# Patient Record
Sex: Male | Born: 1950
Health system: Southern US, Community
[De-identification: ages and names within clinical notes are randomized; demographics above are authoritative.]

## PROBLEM LIST (undated history)

## (undated) DIAGNOSIS — F329 Major depressive disorder, single episode, unspecified: Secondary | ICD-10-CM

## (undated) DIAGNOSIS — I4891 Unspecified atrial fibrillation: Secondary | ICD-10-CM

## (undated) DIAGNOSIS — K219 Gastro-esophageal reflux disease without esophagitis: Secondary | ICD-10-CM

## (undated) DIAGNOSIS — I499 Cardiac arrhythmia, unspecified: Secondary | ICD-10-CM

## (undated) DIAGNOSIS — R001 Bradycardia, unspecified: Secondary | ICD-10-CM

## (undated) DIAGNOSIS — T7840XA Allergy, unspecified, initial encounter: Secondary | ICD-10-CM

## (undated) DIAGNOSIS — H18603 Keratoconus, unspecified, bilateral: Secondary | ICD-10-CM

## (undated) DIAGNOSIS — M1990 Unspecified osteoarthritis, unspecified site: Secondary | ICD-10-CM

## (undated) DIAGNOSIS — F32A Depression, unspecified: Secondary | ICD-10-CM

## (undated) DIAGNOSIS — H269 Unspecified cataract: Secondary | ICD-10-CM

## (undated) DIAGNOSIS — H33319 Horseshoe tear of retina without detachment, unspecified eye: Secondary | ICD-10-CM

## (undated) DIAGNOSIS — R519 Headache, unspecified: Secondary | ICD-10-CM

## (undated) DIAGNOSIS — E785 Hyperlipidemia, unspecified: Secondary | ICD-10-CM

## (undated) DIAGNOSIS — Z87442 Personal history of urinary calculi: Secondary | ICD-10-CM

## (undated) DIAGNOSIS — M8580 Other specified disorders of bone density and structure, unspecified site: Secondary | ICD-10-CM

## (undated) DIAGNOSIS — R42 Dizziness and giddiness: Secondary | ICD-10-CM

## (undated) DIAGNOSIS — N189 Chronic kidney disease, unspecified: Secondary | ICD-10-CM

## (undated) DIAGNOSIS — G4730 Sleep apnea, unspecified: Secondary | ICD-10-CM

## (undated) DIAGNOSIS — F419 Anxiety disorder, unspecified: Secondary | ICD-10-CM

## (undated) DIAGNOSIS — I10 Essential (primary) hypertension: Secondary | ICD-10-CM

## (undated) DIAGNOSIS — R51 Headache: Secondary | ICD-10-CM

## (undated) DIAGNOSIS — E119 Type 2 diabetes mellitus without complications: Secondary | ICD-10-CM

## (undated) HISTORY — DX: Allergy, unspecified, initial encounter: T78.40XA

## (undated) HISTORY — DX: Other specified disorders of bone density and structure, unspecified site: M85.80

## (undated) HISTORY — DX: Gastro-esophageal reflux disease without esophagitis: K21.9

## (undated) HISTORY — PX: RETINAL LASER PROCEDURE: SHX2339

## (undated) HISTORY — DX: Anxiety disorder, unspecified: F41.9

## (undated) HISTORY — DX: Essential (primary) hypertension: I10

## (undated) HISTORY — DX: Hyperlipidemia, unspecified: E78.5

## (undated) HISTORY — DX: Keratoconus, unspecified, bilateral: H18.603

## (undated) HISTORY — PX: WISDOM TOOTH EXTRACTION: SHX21

## (undated) HISTORY — DX: Sleep apnea, unspecified: G47.30

## (undated) HISTORY — DX: Unspecified cataract: H26.9

## (undated) HISTORY — DX: Chronic kidney disease, unspecified: N18.9

## (undated) HISTORY — DX: Bradycardia, unspecified: R00.1

## (undated) HISTORY — PX: EYE SURGERY: SHX253

## (undated) HISTORY — DX: Unspecified atrial fibrillation: I48.91

## (undated) HISTORY — DX: Unspecified osteoarthritis, unspecified site: M19.90

## (undated) HISTORY — PX: POLYPECTOMY: SHX149

## (undated) HISTORY — PX: OTHER SURGICAL HISTORY: SHX169

## (undated) HISTORY — DX: Horseshoe tear of retina without detachment, unspecified eye: H33.319

## (undated) HISTORY — DX: Dizziness and giddiness: R42

## (undated) HISTORY — DX: Type 2 diabetes mellitus without complications: E11.9

---

## 2000-06-16 ENCOUNTER — Emergency Department (HOSPITAL_COMMUNITY): Admission: EM | Admit: 2000-06-16 | Discharge: 2000-06-16 | Payer: Self-pay | Admitting: Emergency Medicine

## 2001-03-10 HISTORY — PX: CARDIAC CATHETERIZATION: SHX172

## 2001-10-01 ENCOUNTER — Ambulatory Visit (HOSPITAL_COMMUNITY): Admission: RE | Admit: 2001-10-01 | Discharge: 2001-10-01 | Payer: Self-pay | Admitting: *Deleted

## 2001-10-01 ENCOUNTER — Encounter: Payer: Self-pay | Admitting: *Deleted

## 2001-10-13 ENCOUNTER — Ambulatory Visit (HOSPITAL_COMMUNITY): Admission: RE | Admit: 2001-10-13 | Discharge: 2001-10-14 | Payer: Self-pay | Admitting: Cardiology

## 2001-10-13 ENCOUNTER — Encounter: Payer: Self-pay | Admitting: Cardiology

## 2004-04-13 ENCOUNTER — Ambulatory Visit: Payer: Self-pay | Admitting: Family Medicine

## 2005-12-09 ENCOUNTER — Ambulatory Visit: Payer: Self-pay | Admitting: Family Medicine

## 2005-12-16 ENCOUNTER — Ambulatory Visit: Payer: Self-pay | Admitting: Family Medicine

## 2005-12-31 ENCOUNTER — Ambulatory Visit: Payer: Self-pay | Admitting: Internal Medicine

## 2005-12-31 ENCOUNTER — Ambulatory Visit: Payer: Self-pay | Admitting: Gastroenterology

## 2006-01-14 ENCOUNTER — Encounter (INDEPENDENT_AMBULATORY_CARE_PROVIDER_SITE_OTHER): Payer: Self-pay | Admitting: *Deleted

## 2006-01-14 ENCOUNTER — Ambulatory Visit: Payer: Self-pay | Admitting: Gastroenterology

## 2006-04-27 ENCOUNTER — Ambulatory Visit: Payer: Self-pay | Admitting: Family Medicine

## 2006-06-24 ENCOUNTER — Ambulatory Visit: Payer: Self-pay | Admitting: Family Medicine

## 2006-10-05 DIAGNOSIS — E782 Mixed hyperlipidemia: Secondary | ICD-10-CM | POA: Insufficient documentation

## 2006-10-05 DIAGNOSIS — I10 Essential (primary) hypertension: Secondary | ICD-10-CM | POA: Insufficient documentation

## 2006-10-05 DIAGNOSIS — F411 Generalized anxiety disorder: Secondary | ICD-10-CM | POA: Insufficient documentation

## 2006-10-21 ENCOUNTER — Telehealth: Payer: Self-pay | Admitting: Family Medicine

## 2006-11-09 ENCOUNTER — Other Ambulatory Visit: Payer: Self-pay

## 2006-11-09 ENCOUNTER — Emergency Department: Payer: Self-pay | Admitting: Emergency Medicine

## 2006-11-10 ENCOUNTER — Ambulatory Visit: Payer: Self-pay | Admitting: Internal Medicine

## 2006-11-10 LAB — CONVERTED CEMR LAB: TSH: 3.41 microintl units/mL (ref 0.35–5.50)

## 2006-11-16 ENCOUNTER — Ambulatory Visit: Payer: Self-pay

## 2007-01-21 ENCOUNTER — Ambulatory Visit: Payer: Self-pay | Admitting: Internal Medicine

## 2007-06-30 ENCOUNTER — Telehealth: Payer: Self-pay | Admitting: Family Medicine

## 2007-07-05 ENCOUNTER — Telehealth: Payer: Self-pay | Admitting: Family Medicine

## 2007-12-15 ENCOUNTER — Ambulatory Visit: Payer: Self-pay | Admitting: Internal Medicine

## 2008-02-01 ENCOUNTER — Ambulatory Visit: Payer: Self-pay | Admitting: Family Medicine

## 2008-02-01 ENCOUNTER — Encounter: Payer: Self-pay | Admitting: Family Medicine

## 2008-04-11 ENCOUNTER — Ambulatory Visit: Payer: Self-pay | Admitting: Family Medicine

## 2008-04-11 LAB — CONVERTED CEMR LAB
Bilirubin Urine: NEGATIVE
Blood in Urine, dipstick: NEGATIVE
Glucose, Urine, Semiquant: NEGATIVE
Ketones, urine, test strip: NEGATIVE
Nitrite: NEGATIVE
Protein, U semiquant: NEGATIVE
Specific Gravity, Urine: 1.02
Urobilinogen, UA: 0.2
WBC Urine, dipstick: NEGATIVE
pH: 6

## 2008-04-17 LAB — CONVERTED CEMR LAB
ALT: 28 units/L (ref 0–53)
AST: 25 units/L (ref 0–37)
Albumin: 3.7 g/dL (ref 3.5–5.2)
Alkaline Phosphatase: 61 units/L (ref 39–117)
BUN: 17 mg/dL (ref 6–23)
Basophils Absolute: 0 10*3/uL (ref 0.0–0.1)
Basophils Relative: 0.7 % (ref 0.0–3.0)
Bilirubin, Direct: 0.1 mg/dL (ref 0.0–0.3)
CO2: 33 meq/L — ABNORMAL HIGH (ref 19–32)
Calcium: 9.3 mg/dL (ref 8.4–10.5)
Chloride: 106 meq/L (ref 96–112)
Cholesterol: 249 mg/dL (ref 0–200)
Creatinine, Ser: 1.1 mg/dL (ref 0.4–1.5)
Direct LDL: 160.9 mg/dL
Eosinophils Absolute: 0.3 10*3/uL (ref 0.0–0.7)
Eosinophils Relative: 4.5 % (ref 0.0–5.0)
GFR calc Af Amer: 89 mL/min
GFR calc non Af Amer: 74 mL/min
Glucose, Bld: 106 mg/dL — ABNORMAL HIGH (ref 70–99)
HCT: 43.5 % (ref 39.0–52.0)
HDL: 38.9 mg/dL — ABNORMAL LOW (ref >39.0)
Hemoglobin: 15.2 g/dL (ref 13.0–17.0)
Lymphocytes Relative: 23 % (ref 12.0–46.0)
MCHC: 35 g/dL (ref 30.0–36.0)
MCV: 88.5 fL (ref 78.0–100.0)
Monocytes Absolute: 0.5 10*3/uL (ref 0.1–1.0)
Monocytes Relative: 9.4 % (ref 3.0–12.0)
Neutro Abs: 3.7 10*3/uL (ref 1.4–7.7)
Neutrophils Relative %: 62.4 % (ref 43.0–77.0)
PSA: 1.08 ng/mL (ref 0.10–4.00)
Platelets: 181 10*3/uL (ref 150–400)
Potassium: 4.6 meq/L (ref 3.5–5.1)
RBC: 4.92 M/uL (ref 4.22–5.81)
RDW: 12.4 % (ref 11.5–14.6)
Sodium: 141 meq/L (ref 135–145)
TSH: 2.47 microintl units/mL (ref 0.35–5.50)
Total Bilirubin: 1 mg/dL (ref 0.3–1.2)
Total CHOL/HDL Ratio: 6.4
Total Protein: 6.5 g/dL (ref 6.0–8.3)
Triglycerides: 190 mg/dL — ABNORMAL HIGH (ref 0–149)
VLDL: 38 mg/dL (ref 0–40)
WBC: 5.8 10*3/uL (ref 4.5–10.5)

## 2008-04-19 ENCOUNTER — Ambulatory Visit: Payer: Self-pay | Admitting: Family Medicine

## 2008-04-19 DIAGNOSIS — K219 Gastro-esophageal reflux disease without esophagitis: Secondary | ICD-10-CM | POA: Insufficient documentation

## 2008-04-19 DIAGNOSIS — R42 Dizziness and giddiness: Secondary | ICD-10-CM | POA: Insufficient documentation

## 2008-05-19 ENCOUNTER — Telehealth: Payer: Self-pay | Admitting: Family Medicine

## 2008-06-02 DIAGNOSIS — M899 Disorder of bone, unspecified: Secondary | ICD-10-CM | POA: Insufficient documentation

## 2008-06-02 DIAGNOSIS — I4891 Unspecified atrial fibrillation: Secondary | ICD-10-CM | POA: Insufficient documentation

## 2008-06-02 DIAGNOSIS — M949 Disorder of cartilage, unspecified: Secondary | ICD-10-CM

## 2008-06-02 DIAGNOSIS — I495 Sick sinus syndrome: Secondary | ICD-10-CM | POA: Insufficient documentation

## 2008-10-04 ENCOUNTER — Telehealth: Payer: Self-pay | Admitting: Family Medicine

## 2008-10-09 ENCOUNTER — Ambulatory Visit: Payer: Self-pay | Admitting: Family Medicine

## 2008-10-10 LAB — CONVERTED CEMR LAB
ALT: 21 units/L (ref 0–53)
AST: 21 units/L (ref 0–37)
Albumin: 3.8 g/dL (ref 3.5–5.2)
Alkaline Phosphatase: 70 units/L (ref 39–117)
BUN: 18 mg/dL (ref 6–23)
Bilirubin, Direct: 0 mg/dL (ref 0.0–0.3)
CO2: 32 meq/L (ref 19–32)
Calcium: 8.6 mg/dL (ref 8.4–10.5)
Chloride: 107 meq/L (ref 96–112)
Cholesterol: 160 mg/dL (ref 0–200)
Creatinine, Ser: 1.1 mg/dL (ref 0.4–1.5)
GFR calc non Af Amer: 73.25 mL/min (ref >60)
Glucose, Bld: 113 mg/dL — ABNORMAL HIGH (ref 70–99)
HDL: 42.5 mg/dL (ref >39.00)
LDL Cholesterol: 84 mg/dL (ref 0–99)
Potassium: 4.5 meq/L (ref 3.5–5.1)
Sodium: 142 meq/L (ref 135–145)
Total Bilirubin: 1.1 mg/dL (ref 0.3–1.2)
Total CHOL/HDL Ratio: 4
Total Protein: 6.5 g/dL (ref 6.0–8.3)
Triglycerides: 168 mg/dL — ABNORMAL HIGH (ref 0.0–149.0)
VLDL: 33.6 mg/dL (ref 0.0–40.0)

## 2008-11-14 ENCOUNTER — Telehealth: Payer: Self-pay | Admitting: Internal Medicine

## 2008-11-20 ENCOUNTER — Ambulatory Visit: Payer: Self-pay | Admitting: Internal Medicine

## 2008-11-20 DIAGNOSIS — F528 Other sexual dysfunction not due to a substance or known physiological condition: Secondary | ICD-10-CM | POA: Insufficient documentation

## 2009-02-23 ENCOUNTER — Telehealth: Payer: Self-pay | Admitting: Family Medicine

## 2009-05-02 ENCOUNTER — Ambulatory Visit: Payer: Self-pay | Admitting: Internal Medicine

## 2009-08-03 ENCOUNTER — Telehealth: Payer: Self-pay | Admitting: Family Medicine

## 2009-09-24 ENCOUNTER — Telehealth: Payer: Self-pay | Admitting: Family Medicine

## 2009-10-09 ENCOUNTER — Ambulatory Visit: Payer: Self-pay | Admitting: Family Medicine

## 2009-10-10 ENCOUNTER — Ambulatory Visit: Payer: Self-pay | Admitting: Family Medicine

## 2009-10-10 LAB — CONVERTED CEMR LAB
Bilirubin Urine: NEGATIVE
Glucose, Urine, Semiquant: NEGATIVE
Ketones, urine, test strip: NEGATIVE
Nitrite: NEGATIVE
Protein, U semiquant: NEGATIVE
Specific Gravity, Urine: 1.02
Urobilinogen, UA: 0.2
WBC Urine, dipstick: NEGATIVE
pH: 5.5

## 2009-10-12 LAB — CONVERTED CEMR LAB
ALT: 31 units/L (ref 0–53)
AST: 22 units/L (ref 0–37)
Albumin: 3.6 g/dL (ref 3.5–5.2)
Alkaline Phosphatase: 61 units/L (ref 39–117)
BUN: 20 mg/dL (ref 6–23)
Basophils Absolute: 0 10*3/uL (ref 0.0–0.1)
Basophils Relative: 0.6 % (ref 0.0–3.0)
Bilirubin, Direct: 0.1 mg/dL (ref 0.0–0.3)
CO2: 30 meq/L (ref 19–32)
Calcium: 8.7 mg/dL (ref 8.4–10.5)
Chloride: 101 meq/L (ref 96–112)
Cholesterol: 176 mg/dL (ref 0–200)
Creatinine, Ser: 1.2 mg/dL (ref 0.4–1.5)
Direct LDL: 103.8 mg/dL
Eosinophils Absolute: 0.4 10*3/uL (ref 0.0–0.7)
Eosinophils Relative: 5.3 % — ABNORMAL HIGH (ref 0.0–5.0)
GFR calc non Af Amer: 64.77 mL/min (ref >60)
Glucose, Bld: 108 mg/dL — ABNORMAL HIGH (ref 70–99)
HCT: 41.4 % (ref 39.0–52.0)
HDL: 41.7 mg/dL (ref >39.00)
Hemoglobin: 14.1 g/dL (ref 13.0–17.0)
Lymphocytes Relative: 19.6 % (ref 12.0–46.0)
Lymphs Abs: 1.3 10*3/uL (ref 0.7–4.0)
MCHC: 34.1 g/dL (ref 30.0–36.0)
MCV: 90.4 fL (ref 78.0–100.0)
Monocytes Absolute: 0.5 10*3/uL (ref 0.1–1.0)
Monocytes Relative: 6.9 % (ref 3.0–12.0)
Neutro Abs: 4.6 10*3/uL (ref 1.4–7.7)
Neutrophils Relative %: 67.6 % (ref 43.0–77.0)
PSA: 1 ng/mL (ref 0.10–4.00)
Platelets: 157 10*3/uL (ref 150.0–400.0)
Potassium: 4.1 meq/L (ref 3.5–5.1)
RBC: 4.58 M/uL (ref 4.22–5.81)
RDW: 14.1 % (ref 11.5–14.6)
Sodium: 140 meq/L (ref 135–145)
TSH: 2.29 microintl units/mL (ref 0.35–5.50)
Total Bilirubin: 1.2 mg/dL (ref 0.3–1.2)
Total CHOL/HDL Ratio: 4
Total Protein: 6 g/dL (ref 6.0–8.3)
Triglycerides: 249 mg/dL — ABNORMAL HIGH (ref 0.0–149.0)
VLDL: 49.8 mg/dL — ABNORMAL HIGH (ref 0.0–40.0)
WBC: 6.8 10*3/uL (ref 4.5–10.5)

## 2010-04-09 NOTE — Assessment & Plan Note (Signed)
Summary: F4M  Medications Added EFFEXOR XR 150 MG XR24H-CAP (VENLAFAXINE HCL) 1 by mouth daily      Allergies Added:   Primary Provider:  Dr. Abran Cantor   History of Present Illness: [name] [name] returns today for followup of his atrial fibrillation.  He has had no increase in the frequency of his atrial fibrillation over the fall.  He went into atrial fibrillation for about an 12 hours.  He is interested in considering atrial fibrillation ablation but I do not think he has enough atrial fibrillation to warrant this.  He denies c/p or sob.  Current Medications (verified): 1)  Atenolol 25 Mg Tabs (Atenolol) .... One By Mouth Once Daily 2)  Effexor Xr 150 Mg Xr24h-Cap (Venlafaxine Hcl) .Marland Kitchen.. 1 By Mouth Daily 3)  Lisinopril 5 Mg Tabs (Lisinopril) .Marland Kitchen.. 1 By Mouth Once Daily 4)  Nexium 40 Mg Cpdr (Esomeprazole Magnesium) .... Take 1 Tab Each Morning 5)  Calcium Carbonate-Vitamin D 600-400 Mg-Unit  Tabs (Calcium Carbonate-Vitamin D) .Marland Kitchen.. 1 By Mouth Once Daily 6)  Aspirin 325 Mg Tabs (Aspirin) .... Take 1 Tab By Mouth Every Day 7)  Zocor 40 Mg Tabs (Simvastatin) .... Once Daily 8)  Flecainide Acetate 100 Mg Tabs (Flecainide Acetate) .... Take 2 Tablets As Needed For A Fib Over An Hour Period  Allergies (verified): 1)  ! Paxil (Paroxetine Hcl) 2)  ! * Phosphorus  Past History:  Past Medical History: Last updated: 06/02/2008 OSTEOPENIA (ICD-733.90) SINUS BRADYCARDIA (ICD-427.81) ATRIAL FIBRILLATION (ICD-427.31) WELL ADULT EXAM (ICD-V70.0) GERD (ICD-530.81) DIZZINESS OR VERTIGO (ICD-780.4) FAMILY HISTORY DIABETES 1ST DEGREE RELATIVE (ICD-V18.0) HYPERLIPIDEMIA (ICD-272.4) ANXIETY (ICD-300.00) HYPERTENSION (ICD-401.9)    Past Surgical History: Last updated: 04/19/2008 Denies surgical history cardiac catheterization 2003, normal colonoscopy 2007, benign polyps, repeat in 5 yrs  Review of Systems  The patient denies chest pain, syncope, dyspnea on exertion, and peripheral edema.     Vital Signs:  Patient profile:   60 year old male Height:      72 inches Weight:      243 pounds BMI:     33.08 Pulse rate:   78 / minute Resp:     16 per minute BP sitting:   123 / 78  (right arm)  Vitals Entered By: Marrion Coy, CNA (May 02, 2009 9:39 AM)  Physical Exam  General:  Well developed, well nourished, in no acute distress. Head:  normocephalic and atraumatic Eyes:  PERRLA/EOM intact; conjunctiva and lids normal. Mouth:  Teeth, gums and palate normal. Oral mucosa normal. Neck:  Neck supple, no JVD. No masses, thyromegaly or abnormal cervical nodes. Chest Wall:  No deformities, masses, tenderness or gynecomastia noted. Lungs:  Clear bilaterally to auscultation with no wheezes, rales, or rhonchi. Heart:  RRR with normal S1 and S2.  PMI is not enlarged or laterally displaced. Abdomen:  Bowel sounds positive; abdomen soft and non-tender without masses, organomegaly, or hernias noted. No hepatosplenomegaly. Msk:  Back normal, normal gait. Muscle strength and tone normal. Pulses:  pulses normal in all 4 extremities Extremities:  No clubbing or cyanosis. Neurologic:  Alert and oriented x 3.   Impression & Recommendations:  Problem # 1:  ATRIAL FIBRILLATION (ICD-427.31) His symptoms are well controlled. He has not had much in the way of symptoms.  He is not taking flecainide. His updated medication list for this problem includes:    Atenolol 25 Mg Tabs (Atenolol) ..... One by mouth once daily    Aspirin 325 Mg Tabs (Aspirin) .Marland Kitchen... Take 1 tab by  mouth every day    Flecainide Acetate 100 Mg Tabs (Flecainide acetate) .Marland Kitchen... Take 2 tablets as needed for a fib over an hour period  Problem # 2:  HYPERTENSION (ICD-401.9) His blood pressure is well controlled.  Continue current meds. His updated medication list for this problem includes:    Atenolol 25 Mg Tabs (Atenolol) ..... One by mouth once daily    Lisinopril 5 Mg Tabs (Lisinopril) .Marland Kitchen... 1 by mouth once daily     Aspirin 325 Mg Tabs (Aspirin) .Marland Kitchen... Take 1 tab by mouth every day  Patient Instructions: 1)  Your physician recommends that you schedule a follow-up appointment in: 12 months with Dr Ladona Ridgel

## 2010-04-09 NOTE — Assessment & Plan Note (Signed)
Summary: fu on meds/njr pt rsc/njr   Vital Signs:  Patient profile:   60 year old male Weight:      242 pounds BP sitting:   130 / 90  (left arm) Cuff size:   large  Vitals Entered By: Kathrynn Speed CMA (October 09, 2009 8:41 AM) CC: fu on meds, different acid reflux med due to insurance,had been getting Atenolol 50 mg from cardiologist,src   History of Present Illness: Here for med refills. He feels fine, and has no concerns. After being out of work for the past 2 1/2 years, he will start a new job in 2 weeks. He needs med refills, and it has been a year since he had labs drawn. He gets occasional brief runs of atrial fibrillation, but these are easily tolerated.   Current Medications (verified): 1)  Atenolol 25 Mg Tabs (Atenolol) .... One By Mouth Once Daily 2)  Effexor Xr 150 Mg Xr24h-Cap (Venlafaxine Hcl) .Marland Kitchen.. 1 By Mouth Daily 3)  Lisinopril 5 Mg Tabs (Lisinopril) .Marland Kitchen.. 1 By Mouth Once Daily 4)  Nexium 40 Mg Cpdr (Esomeprazole Magnesium) .... Take 1 Tab Each Morning 5)  Calcium Carbonate-Vitamin D 600-400 Mg-Unit  Tabs (Calcium Carbonate-Vitamin D) .Marland Kitchen.. 1 By Mouth Once Daily 6)  Aspirin 325 Mg Tabs (Aspirin) .... Take 1 Tab By Mouth Every Day 7)  Zocor 40 Mg Tabs (Simvastatin) .... Once Daily 8)  Flecainide Acetate 100 Mg Tabs (Flecainide Acetate) .... Take 2 Tablets As Needed For A Fib Over An Hour Period  Allergies (verified): 1)  ! Paxil (Paroxetine Hcl) 2)  ! * Phosphorus  Past History:  Past Medical History: OSTEOPENIA (ICD-733.90) SINUS BRADYCARDIA (ICD-427.81) ATRIAL FIBRILLATION (ICD-427.31), sees Dr. Sharrell Ku WELL ADULT EXAM (ICD-V70.0) GERD (ICD-530.81) DIZZINESS OR VERTIGO (ICD-780.4) FAMILY HISTORY DIABETES 1ST DEGREE RELATIVE (ICD-V18.0) HYPERLIPIDEMIA (ICD-272.4) ANXIETY (ICD-300.00) HYPERTENSION (ICD-401.9)    Review of Systems  The patient denies anorexia, fever, weight loss, weight gain, vision loss, decreased hearing, hoarseness, chest pain,  syncope, dyspnea on exertion, peripheral edema, prolonged cough, headaches, hemoptysis, abdominal pain, melena, hematochezia, severe indigestion/heartburn, hematuria, incontinence, genital sores, muscle weakness, suspicious skin lesions, transient blindness, difficulty walking, depression, unusual weight change, abnormal bleeding, enlarged lymph nodes, angioedema, breast masses, and testicular masses.    Physical Exam  General:  Well-developed,well-nourished,in no acute distress; alert,appropriate and cooperative throughout examination Neck:  No deformities, masses, or tenderness noted. Lungs:  Normal respiratory effort, chest expands symmetrically. Lungs are clear to auscultation, no crackles or wheezes. Heart:  Normal rate and regular rhythm. S1 and S2 normal without gallop, murmur, click, rub or other extra sounds. Psych:  Cognition and judgment appear intact. Alert and cooperative with normal attention span and concentration. No apparent delusions, illusions, hallucinations   Impression & Recommendations:  Problem # 1:  ATRIAL FIBRILLATION (ICD-427.31)  His updated medication list for this problem includes:    Atenolol 25 Mg Tabs (Atenolol) .Marland Kitchen..Marland Kitchen Two times a day    Aspirin 325 Mg Tabs (Aspirin) .Marland Kitchen... Take 1 tab by mouth every day    Flecainide Acetate 100 Mg Tabs (Flecainide acetate) .Marland Kitchen... Take 2 tablets as needed for a fib over an hour period  Problem # 2:  HYPERLIPIDEMIA (ICD-272.4)  His updated medication list for this problem includes:    Zocor 40 Mg Tabs (Simvastatin) ..... Once daily  Problem # 3:  ANXIETY (ICD-300.00)  His updated medication list for this problem includes:    Effexor Xr 150 Mg Xr24h-cap (Venlafaxine hcl) .Marland Kitchen... 1 by mouth  daily  Problem # 4:  HYPERTENSION (ICD-401.9)  His updated medication list for this problem includes:    Atenolol 25 Mg Tabs (Atenolol) .Marland Kitchen..Marland Kitchen Two times a day    Lisinopril 5 Mg Tabs (Lisinopril) .Marland Kitchen... 1 by mouth once daily  Complete  Medication List: 1)  Atenolol 25 Mg Tabs (Atenolol) .... Two times a day 2)  Effexor Xr 150 Mg Xr24h-cap (Venlafaxine hcl) .Marland Kitchen.. 1 by mouth daily 3)  Lisinopril 5 Mg Tabs (Lisinopril) .Marland Kitchen.. 1 by mouth once daily 4)  Calcium Carbonate-vitamin D 600-400 Mg-unit Tabs (Calcium carbonate-vitamin d) .Marland Kitchen.. 1 by mouth once daily 5)  Aspirin 325 Mg Tabs (Aspirin) .... Take 1 tab by mouth every day 6)  Zocor 40 Mg Tabs (Simvastatin) .... Once daily 7)  Flecainide Acetate 100 Mg Tabs (Flecainide acetate) .... Take 2 tablets as needed for a fib over an hour period 8)  Omeprazole 40 Mg Cpdr (Omeprazole) .... Once daily  Patient Instructions: 1)  Refilled meds. He will return soon for fasting labs. We will plan on giving him a cpx later this fall when his new insurance becomes effective.  Prescriptions: ZOCOR 40 MG TABS (SIMVASTATIN) once daily  #90 x 3   Entered and Authorized by:   Nelwyn Salisbury MD   Signed by:   Nelwyn Salisbury MD on 10/09/2009   Method used:   Print then Give to Patient   RxID:   [id number] OMEPRAZOLE 40 MG CPDR (OMEPRAZOLE) once daily  #90 x 3   Entered and Authorized by:   Nelwyn Salisbury MD   Signed by:   Nelwyn Salisbury MD on 10/09/2009   Method used:   Print then Give to Patient   RxID:   [id number] LISINOPRIL 5 MG TABS (LISINOPRIL) 1 by mouth once daily  #90 x 3   Entered and Authorized by:   Nelwyn Salisbury MD   Signed by:   Nelwyn Salisbury MD on 10/09/2009   Method used:   Print then Give to Patient   RxID:   [id number] EFFEXOR XR 150 MG XR24H-CAP (VENLAFAXINE HCL) 1 by mouth daily  #90 x 3   Entered and Authorized by:   Nelwyn Salisbury MD   Signed by:   Nelwyn Salisbury MD on 10/09/2009   Method used:   Print then Give to Patient   RxID:   [id number] ATENOLOL 25 MG TABS (ATENOLOL) two times a day  #180 x 3   Entered and Authorized by:   Nelwyn Salisbury MD   Signed by:   Nelwyn Salisbury MD on 10/09/2009   Method used:   Print then Give to Patient   RxID:    [id number]

## 2010-04-09 NOTE — Progress Notes (Signed)
Summary: [facility] new rx  Phone Note Call from Patient Call back at Home Phone [phone]   Caller: Patient Call For: Nelwyn Salisbury MD Summary of Call: pt needs new rx fax to [facility] [phone] lisinopril 5 mg #90 with 3 refills also simvastin 40 mg,atenolol 50 mg and generic effexor 150 mg and generic nexium 40 mg. pt needs 14 day supply call into [facility] [place] [phone] generic effexor 150 mg and lisinopril 5 mg, simvastin 40 and generic nexium until mail order arrives Initial call taken by: Heron Sabins,  September 24, 2009 10:29 AM  Follow-up for Phone Call        he needs an OV first before  any refills can be written Follow-up by: Nelwyn Salisbury MD,  September 24, 2009 5:25 PM  Additional Follow-up for Phone Call Additional follow up Details #1::        ov 09-25-2009 3pm Additional Follow-up by: Heron Sabins,  September 25, 2009 8:11 AM

## 2010-04-09 NOTE — Progress Notes (Signed)
Summary: poison oak/ivy  Phone Note Call from Patient Call back at Home Phone [phone]   Summary of Call: Can a Rx be called in for poison oak that he got into earlier this week working outside.  Rash, whelps, scratching, itching on arms & legs.  Chlorox treatment has not helped.  [place] [facility] [employer].  Allergic sulfa. Initial call taken by: Rudy Jew, RN,  Aug 03, 2009 8:59 AM  Follow-up for Phone Call        call in a 10 mg prednisone taper pack for 12 days Follow-up by: Nelwyn Salisbury MD,  Aug 03, 2009 9:29 AM  Additional Follow-up for Phone Call Additional follow up Details #1::        Left message to check with drugstore. Additional Follow-up by: Rudy Jew, RN,  Aug 03, 2009 10:43 AM    New/Updated Medications: PREDNISONE (PAK) 10 MG TABS (PREDNISONE) 12 day pack as directed Prescriptions: PREDNISONE (PAK) 10 MG TABS (PREDNISONE) 12 day pack as directed  #1 x 9   Entered by:   Rudy Jew, RN   Authorized by:   Nelwyn Salisbury MD   Signed by:   Rudy Jew, RN on 08/03/2009   Method used:   Electronically to        [facility] [address] (retail)       [address], [address]       [place], [place]  [address]       Ph: [phone]       Fax: [phone]   RxID:   [id number]

## 2010-07-23 NOTE — Assessment & Plan Note (Signed)
[facility]                         ELECTROPHYSIOLOGY OFFICE NOTE   NAME:[name] [name]                      MRN:          [id number]  DATE:01/21/2007                            DOB:          1951-01-07    [name] [name] returns today for followup.  He is a very pleasant middle-  aged male with a history of paroxysmal atrial fibrillation and  hypertension and palpations for many years.  He was recently found to  have atrial fibrillation.  He notes that his episodes of atrial  fibrillation are very minimal.  He feels palpitations every day but  these last for seconds at a time.  He has had no prolonged episodes of  AFib since back in summertime.  This was a single __________  episode.  He has had none since.   PHYSICAL EXAMINATION:  GENERAL:  He is a pleasant, very well-appearing,  middle-aged man in no acute distress.  VITAL SIGNS:  Blood pressure 115/76, the pulse was 57 and regular,  respirations were 18, and the weight was 235 pounds.  NECK:  Revealed no jugular venous distention.  LUNGS:  Clear bilaterally to auscultation.  No wheezes, rales, or  rhonchi are present.  CARDIOVASCULAR:  Revealed a regular rate and rhythm with normal S1 and  S2.  EXTREMITIES:  Demonstrated no edema.   MEDICATIONS:  1. Lisinopril 5 mg daily.  2. Nexium 40 mg daily.  3. Calcium daily.  4. Atenolol 50 mg daily.  5. Effexor 75 mg daily.  6. Aspirin 81 mg three times a day.   IMPRESSION:  1. Paroxysmal atrial fibrillation.  2. Borderline hypertension.  3. Palpitations secondary to number one.   DISCUSSION:  Overall, [name] [name] is stable.  He is maintaining a sinus  rhythm very nicely.  I will plan to see him back in 6 months.  He will  call us if his AFib recurs or worsens.     [name] Canning. Ladona Ridgel, MD  Electronically Signed    GWT/MedQ  DD: 01/21/2007  DT: 01/22/2007  Job #: [id number]   cc:   [name] Senior A. Clent Ridges, MD

## 2010-07-23 NOTE — Assessment & Plan Note (Signed)
[facility]                         ELECTROPHYSIOLOGY OFFICE NOTE   NAME:Schaible, BRION SOSSAMON                      MRN:          [id number]  DATE:12/15/2007                            DOB:          June 20, 1950    Mr. Ballinas returns today for followup.  He is a very pleasant middle-  aged male with paroxysmal AFib and sinus bradycardia and well-controlled  hypertension, who we have not seen in the last year, but has done quite  well since his last visit.  The patient denies chest pain.  He denies  shortness of breath.  He has been very well.  He notes that he was laid  off from his job back in March and has been off for the last 6 months.  He is about to return to work.  He has been very stable, however, with  basically about 30 seconds of AFib on 1 occasion since we last saw him.  He denies chest pain or shortness of breath.   MEDICINES:  1. Lisinopril 5 a day.  2. Nexium 40 a day.  3. Aspirin 81 mg 2-3 tablets a day.  4. Atenolol 50 a day.  5. Effexor 75 a day.   PHYSICAL EXAMINATION:  GENERAL:  He is a pleasant well-appearing man in  no acute distress.  VITAL SIGNS:  Blood pressures 129/83, the pulse is 60 and regular,  respirations were 18, and the weight was 239 pounds.  NECK:  No jugular venous distention.  LUNGS:  Clear bilaterally to auscultation.  No wheezes, rales, or  rhonchi are present.  CARDIOVASCULAR:  Regular rate and rhythm.  Normal S1 and S2.  EXTREMITIES:  No edema.   EKG demonstrates sinus rhythm with sinus bradycardia and sinus  arrhythmia.   IMPRESSION:  1. Paroxysmal atrial fibrillation, now well controlled.  2. Sinus bradycardia, asymptomatic.  3. Hypertension.   DISCUSSION:  Overall, Mr. Broughton is stable.  He is maintaining sinus  rhythm very nicely.  I will see him back in the office for followup in 1  year or sooner if he has worsening AFib.     Doylene Canning. Ladona Ridgel, MD  Electronically Signed   GWT/MedQ  DD:  12/15/2007  DT: 12/16/2007  Job #: [id number]   cc:   Jeannett Senior A. Clent Ridges, MD

## 2010-07-23 NOTE — Assessment & Plan Note (Signed)
[facility]                         ELECTROPHYSIOLOGY OFFICE NOTE   HERNANDO, REALI                      MRN:          [id number]  DATE:11/10/2006                            DOB:          06-08-1950    REFERRING PHYSICIAN:  Tera Mater. Clent Ridges, MD   REFERRING PHYSICIAN:  Gerrit Friends. Dietrich Pates, MD.   REASON FOR VISIT:  Evaluation of atrial fibrillation.   The patient is a very pleasant, middle-aged man with a history of  palpitations and a documented anxiety disorder who has undergone monitor  wearing for many years. He is fairly active. He notes that he has been  under increased stress at work lately. He had no specific complaint but  was in his usual state of health several days ago when he had  palpitations which persisted and resulted in going to the [facility]  emergency room where he was subsequently found by report to be in atrial  fibrillation. I do not presently have strips of this but the report and  symptoms are fairly clear cut that he was in fact in A fib. The patient  was discharged home. He took some additional beta blockers and when he  awoke the following morning which was yesterday, he denied palpitations.  He is now referred for additional evaluation. The patient has never had  a prolonged episode of A fib other than the episode that he described  here recently.   MEDICATIONS:  1. Aspirin 81 mg daily.  2. Lisinopril 5 mg daily.  3. Atenolol 25 mg daily.  4. Nexium 40 a day.   PAST MEDICAL HISTORY:  Noncontributory.   SOCIAL HISTORY:  The patient is married, he denies tobacco or ethanol  abuse.   REVIEW OF SYSTEMS:  Negative except as noted in the HPI.   PHYSICAL EXAMINATION:  GENERAL:  Notable for him being a pleasant, well-  appearing man in no acute distress.  VITAL SIGNS:  The blood pressure was 110/74, the pulse 69 and regular.  Respirations were 18, the weight was 228 pounds.  HEENT:  Normocephalic, atraumatic. Pupils  equal and round, the  oropharynx was moist. Sclera anicteric.  NECK:  Revealed no jugular venous distention. There was no thyromegaly.  The trachea was midline. The carotids were 2+ and symmetric.  LUNGS:  Clear bilaterally to auscultation. No wheezes, rales or rhonchi  were present. There was no increased work of breathing.  CARDIOVASCULAR:  Regular rate and rhythm with normal S1 and S2. No  murmurs, rubs or gallops.  EXTREMITIES:  Demonstrated no cyanosis, clubbing or edema.  SKIN:  Normal.  NEUROLOGIC:  Alert and oriented x2. Cranial nerves intact. Strength was  5/5 and symmetric.  MUSCULOSKELETAL:  Normal.   EKG demonstrates sinus rhythm with normal axis and intervals. There were  no acute ST-T wave changes.   IMPRESSION:  1. New onset atrial fibrillation.  2. Palpitations.  3. Very borderline hypertension.   DISCUSSION:  I have asked today that Mr. Mckim undergo 2-D  echocardiography, that he obtain a TSH and that we increase him to full  strength aspirin. I will see  him back in the office in several months.  He is asked to take an additional beta blocker if he develops recurrent  atrial fibrillation. I have asked that he go up to 50 mg a day.     Doylene Canning. Ladona Ridgel, MD  Electronically Signed    GWT/MedQ  DD: 11/10/2006  DT: 11/11/2006  Job #: [id number]

## 2010-07-26 NOTE — Assessment & Plan Note (Signed)
[facility] OFFICE NOTE   [name] [name]                        MRN:          [id number]  DATE:12/16/2005                            DOB:          03-20-1950    This is a 60 year old gentleman here for a complete physical examination.  I  have not seen him now in almost 2 years.  He had a number of visits with me  and an extensive cardiac workup a couple of years ago for palpitations.  These were ultimately chalked up to anxiety and stress.  Once we got these  under control they have basically gone away, although he does feel an  occasional palpitation from time to time.  He has been on Effexor now for  several years and feels that it is very effective, his stress levels are  well managed, and he wonders if he could start coming off of the medication.  Otherwise, he has been doing a lot of traveling on the job over the past  year and not eating well.  He has gained a lot of weight and has not been  able to exercise very much.  He has never had a colonoscopy and he is  interested in having one of these.  He is also interested in having a bone  density evaluation.  He has been seeing a Land in [place] for  some time for occasional manipulations to relieve neck pain.  After a recent  set of x-rays his chiropractor told him his bones looked a little thin and  he suggested having a bone density test.  Of note, [name] [name]'s family history  includes severe osteoporosis in his mother.   For other details of his past medical history, family history, social  history, habits, etc., refer to our last physical note dated August 31, 2000.   ALLERGIES:  PHOSPHORUS and PAXIL.   CURRENT MEDICATIONS:  1. Multivitamins daily.  2. Aspirin 81 mg daily.  3. Lisinopril 5 mg daily.  4. Nexium 40 mg daily.  5. Atenolol 25 mg daily.  6. Effexor XR 150 mg daily.  7. Tylenol as needed for joint pains.   OBJECTIVE:  VITAL  SIGNS:  Weight 234, BP 138/94, pulse 72 and regular.  GENERAL:  He is a bit overweight.  SKIN:  Clear.  HEENT:  Eyes clear, wears glasses.  Ears clear, pharynx clear.  NECK:  Supple without lymphadenopathy or masses.  LUNGS:  Clear.  CARDIAC:  Rate and rhythm are regular without gallops, murmurs, rubs.  Distal pulses are full.  EKG is within normal limits.  ABDOMEN:  Soft, normal bowel sounds, nontender, no masses.  GENITALIA:  Normal male.  RECTAL:  No mass or tenderness.  Prostate is within normal limits.  Stool  hemoccult negative.  EXTREMITIES:  No clubbing, cyanosis, or edema.  NEUROLOGIC:  Grossly intact.   He was here for fasting labs on October 2 and these were remarkable for an  abnormal lipid panel.  Total cholesterol is 253, triglycerides are 212, HDL  is 39, and LDL has jumped  up to 157.  Otherwise, his labs were within normal  limits.   ASSESSMENT AND PLAN:  1. Complete physical examination.  He has gained some weight over the past      year so we talked about eating a smarter diet and getting more exercise      to lose weight.  Will also set up his first screening colonoscopy.  2. Possible osteoporosis.  Will set up a DEXA evaluation.  3. Hypertension, stable.  4. Hyperlipidemia.  Will make dietary changes as noted above and recheck a      lipid panel again in 6 months.  5. Anxiety, stable.  I told him we need to come down off of Effexor XR      very slowly.  Will decrease his Effexor XR daily dose to 75 mg over the      next 4 weeks and I asked to see him again in about 4 weeks to see how      he is feeling.  6. History of palpitations, stable.            ______________________________  Tera Mater. Clent Ridges, MD     SAF/MedQ  DD:  12/16/2005  DT:  12/18/2005  Job #:  [id number]

## 2010-07-26 NOTE — Cardiovascular Report (Signed)
   NAMEMERRICK, [name] [name]                           ACCOUNT NO.:  [id number]   MEDICAL RECORD NO.:  [id number]                   PATIENT TYPE:  OIB   LOCATION:  6533                                 FACILITY:  [facility]   PHYSICIAN:  Salvadore Farber, MD [facility]            DATE OF BIRTH:  Jul 14, 1950   DATE OF PROCEDURE:  10/13/2001  DATE OF DISCHARGE:  10/14/2001                              CARDIAC CATHETERIZATION   PROCEDURES:  Coronary angiography, left ventriculogram, left heart  catheterization.   INDICATIONS:  The patient is a 60 year old gentleman with hypertension and  family history of premature atherosclerotic disease, who has been troubled  by a profound fatigue. Exercise test performed two weeks ago demonstrated a  possible inferior defect. He was therefore referred for diagnostic  angiography.   DIAGNOSTIC TECHNIQUE:  Informed consent was obtained. Under 2% lidocaine  local anesthesia, a 6 French sheath was placed in the right femoral artery  using the modified Seldinger technique. Coronary arteries were engaged using  JL4 and JR4 catheters. Angiography was performed by hand injection.  A  pigtail catheter was advanced across the aortic valve into the left  ventricle.  Left ventriculography was performed by power injection. Left  ventricular pressures were measured. Following the procedure, the sheaths  were removed.   FINDINGS:  Right dominant system with angiographically normal coronary  arteries.  1. Left main:  Normal.  2. LAD:  The LAD is a large vessel which gives rise to two diagonal     branches. It is normal.  3. Circumflex:  The circumflex is a moderate sized vessel which gives rise     to one branching obtuse marginal branch. It is normal.  4. RCA:  The right coronary artery is dominant. It is normal.  5. LV:  Ejection fraction equals 65% without regional wall motion     abnormality. LV pressure 117/3/11.  6. There is no aortic stenosis or mitral regurgitation.   IMPRESSION:  Angiographically normal coronary arteries and left ventricular  systolic function.   PLAN:  We will plan on discharge this afternoon.  We will stop his beta  blocker in the case it is contributing to his depression. Will replace it  with lisinopril 5 mg p.o. q.d.                                                     Salvadore Farber, MD [facility]    WED/MEDQ  D:  10/13/2001  T:  10/18/2001  Job:  [id number]

## 2010-07-26 NOTE — Discharge Summary (Signed)
NAMEKEI, MCELHINEY                           ACCOUNT NO.:  [id number]   MEDICAL RECORD NO.:  [id number]                   PATIENT TYPE:  OIB   LOCATION:  6533                                 FACILITY:  [facility]   PHYSICIAN:  Joellyn Rued                        DATE OF BIRTH:  08-08-1950   DATE OF ADMISSION:  10/13/2001  DATE OF DISCHARGE:  10/14/2001                           DISCHARGE SUMMARY - REFERRING   HISTORY OF PRESENT ILLNESS:  Mr. Wolfson is a 60 year old white male who was  referred to our office from his primary care physician due to chest  discomfort.  He was seen in our office on August 5 by Dr. Samule Ohm.  According  to the note, he has had chest discomfort principally after he eats.  He has  taken Nexium every day for six weeks without relief of symptoms.  The  discomfort prompted an exercise Cardiolite which was performed on October 05, 2001.  He did exercise for 12 minutes and 15 seconds, achieving a maximum  heartbeat of 154, which is 91% predicted maximum.  Blood pressure increased  to 162/85.  He did not have any EKG changes, although he had modest  hypoprofusion of inferior apex extending up to the inferior wall toward the  base.  He does have a history of hypertension, hyperlipidemia, remote  tobacco use, and a family history.  He also describes a history of  palpitations which supposedly had been evaluated elsewhere; however, we do  not have any records of this.  An echocardiogram performed in October 2002  showed normal LV size and systolic function with an atrial septal aneurysm  without valvular disease.  It was also noted in the office that a hemoglobin  A1c was checked; however, the results are unavailable at the time of this  dictation.   LABORATORY DATA:  Preadmission H&H was 16.4 and 48.5, normal MCs, platelets  214, WBC 7.5.  Sodium 138, potassium 4.6, BUN 14, creatinine 1.1.   EKGs show normal sinus rhythm, nonspecific ST/T-wave changes, early  repolarization.   HOSPITAL COURSE:  Mr. Tienda underwent outpatient cardiac catheterization  on August 6 by Dr. Samule Ohm.  According to his progress note his ejection  fraction is 65% without regional wall motion abnormalities.  His LV  pressures were 117/3.  He did not have any MES or MR.  He had normal  coronary arteries.  Dr. Samule Ohm noted that he will stop his beta blocker in  case this is contributing to the patient's depression and he changed him to  an ACE inhibitor for his blood pressure.  _________ on bedrest.  The patient  did develop an abrupt hematoma at the catheterization site without  associated renal response.  Pressure was held an additional 21 minutes with  good hemostasis.  He remained overnight for observation.  On the morning of  August 7,  Dr. Samule Ohm felt catheterization site was intact.  Thus he was  discharged home.   DISCHARGE DIAGNOSES:  1. Noncardiac chest discomfort, false positive stress Cardiolite.  2. Depression.  3. History of hypertension.  4. History of hyperlipidemia.   DISPOSITION:  He is discharged home.   FOLLOW UP:  He will return to our office on August 15 at 9:15 a.m. with a  groin check with BPA.  At that time the patient was asking about what his  hemoglobin A1c is.  This is not available at the time of this dictation.  He  was instructed that he will be informed when he returns to the office.  He  will also have a BMP early next week prior to his appointment.    MEDICATIONS:  1. He was discharged home on aspirin 81 mg q.d.  2. Nexium 40 mg q.d.  3. Lisinopril 5 mg q.d.  4. Multivitamin q.d.   ACTIVITY:  He was advised no lifting, driving, sexual activity, or heavy  exertion x2 days.   DIET:  Maintain low salt, fat, cholesterol diet.   WOUND CARE:  If he has any problems with his catheterization site, he is  asked to call us immediately.   SPECIAL INSTRUCTIONS:  He was asked to arrange follow-up appointment with  Dr. Clent Ridges for  continuing management of his hypertension and hyperlipidemia,  depression issues.  He was instructed not to take his Toprol.                                                Joellyn Rued    EW/MEDQ  D:  10/14/2001  T:  10/18/2001  Job:  [id number]   cc:   Tera Mater. Clent Ridges, M.D. [facility]   Salvadore Farber, MD [facility]  [address] [place], [place] [address]  Fax: 1

## 2010-08-23 ENCOUNTER — Other Ambulatory Visit: Payer: Self-pay | Admitting: Family Medicine

## 2010-09-30 ENCOUNTER — Telehealth: Payer: Self-pay | Admitting: Internal Medicine

## 2010-09-30 NOTE — Telephone Encounter (Signed)
Pt was due to come in February, on Friday, was tired, pt went out of rythmn, took med went away after 1 hour and about 10 min and has not come back, next available 11-13-10 , pls advise, no appt made yet

## 2010-09-30 NOTE — Telephone Encounter (Signed)
LMOM for call back. 

## 2010-10-01 NOTE — Telephone Encounter (Signed)
Had an episode on 09/27/10 that lasted over an hour  Started around 9am and lasted until 10:10am  Took Flecainide and it went back into rhythm almost immediately  He feels it would have gone back in with out but just wanted [place] to know  He is going to go ahead and make a follow up appointment with Dr Ladona Ridgel

## 2010-10-01 NOTE — Telephone Encounter (Signed)
Pt calling re message from yesterday, was to call back today with update on condition -pls call [phone]

## 2010-10-08 ENCOUNTER — Encounter: Payer: Self-pay | Admitting: Internal Medicine

## 2010-10-14 ENCOUNTER — Other Ambulatory Visit: Payer: Self-pay | Admitting: *Deleted

## 2010-10-14 NOTE — Telephone Encounter (Signed)
Pt requesting rx refill of :  Venlafaxine 150 mg Simvastatin 40mg  Atenolol 25mg  Lisinopril 5 mg   Sent to [facility] on [address] in [place].  Pt has appt for cpx scheduled for 11/05/10, only needs a 30 day supply sent to [facility], normally gets refills sent to mail order pharmacy.

## 2010-10-15 ENCOUNTER — Ambulatory Visit (INDEPENDENT_AMBULATORY_CARE_PROVIDER_SITE_OTHER): Payer: BC Managed Care – PPO | Admitting: Internal Medicine

## 2010-10-15 ENCOUNTER — Encounter: Payer: Self-pay | Admitting: Internal Medicine

## 2010-10-15 DIAGNOSIS — I495 Sick sinus syndrome: Secondary | ICD-10-CM

## 2010-10-15 DIAGNOSIS — I10 Essential (primary) hypertension: Secondary | ICD-10-CM

## 2010-10-15 DIAGNOSIS — I4891 Unspecified atrial fibrillation: Secondary | ICD-10-CM

## 2010-10-15 MED ORDER — LISINOPRIL 5 MG PO TABS: 5.0000 mg | ORAL_TABLET | Freq: Every day | ORAL | Status: DC

## 2010-10-15 MED ORDER — SIMVASTATIN 40 MG PO TABS: 40.0000 mg | ORAL_TABLET | Freq: Every day | ORAL | Status: DC

## 2010-10-15 MED ORDER — ATENOLOL 25 MG PO TABS: 25.0000 mg | ORAL_TABLET | Freq: Two times a day (BID) | ORAL | Status: DC

## 2010-10-15 MED ORDER — VENLAFAXINE HCL ER 150 MG PO CP24: 150.0000 mg | ORAL_CAPSULE | Freq: Every day | ORAL | Status: DC

## 2010-10-15 MED ORDER — FLECAINIDE ACETATE 100 MG PO TABS: 200.0000 mg | ORAL_TABLET | ORAL | Status: DC | PRN

## 2010-10-15 NOTE — Telephone Encounter (Signed)
Short term 30 day refill efile to [facility] - keep appt 11/04/10

## 2010-10-15 NOTE — Assessment & Plan Note (Signed)
This remains asymptomatic. No indication for pacemaker at this time.

## 2010-10-15 NOTE — Assessment & Plan Note (Signed)
His blood pressure appears to be well-controlled. He will continue his current medications and maintain a low-sodium diet.

## 2010-10-15 NOTE — Assessment & Plan Note (Signed)
His symptoms are still well controlled. I recommended that he continue to use his antiarrhythmic drug as needed. He'll continue his low-dose beta blocker daily. If his symptoms of atrial fibrillation increase in frequency and severity, then I would recommend flecainide on a daily basis.

## 2010-10-15 NOTE — Progress Notes (Signed)
HPI [name] [name] returns today for followup. He is a pleasant 60 yo man with a h/o PAF, who has done very well, maintaining NSR for several yrs. He notes that about 3 weeks ago, he had an episode of palpitations and ultimately took 2 tabs of flecainide with prompt resolution of his symptoms. He denies c/p, sob, or peripheral edema. No syncope.  Allergies  Allergen Reactions  . Paroxetine   . Sulfa Antibiotics      Current Outpatient Prescriptions  Medication Sig Dispense Refill  . aspirin 325 MG tablet Take 325 mg by mouth daily.        Marland Kitchen atenolol (TENORMIN) 25 MG tablet Take 1 tablet (25 mg total) by mouth 2 (two) times daily.  60 tablet  0  . Calcium Carbonate-Vitamin D 600-400 MG-UNIT per tablet Take 1 tablet by mouth daily.        . flecainide (TAMBOCOR) 100 MG tablet Take 200 mg by mouth as needed. For a fib over an hour period       . lisinopril (PRINIVIL,ZESTRIL) 5 MG tablet Take 1 tablet (5 mg total) by mouth daily.  30 tablet  0  . omeprazole (PRILOSEC) 40 MG capsule Take 40 mg by mouth daily.        . simvastatin (ZOCOR) 40 MG tablet Take 1 tablet (40 mg total) by mouth daily.  30 tablet  0  . venlafaxine (EFFEXOR XR) 150 MG 24 hr capsule Take 1 capsule (150 mg total) by mouth daily.  30 capsule  0     Past Medical History  Diagnosis Date  . Osteopenia   . Sinus bradycardia   . Atrial fibrillation     sees Dr. Ladona Ridgel  . GERD (gastroesophageal reflux disease)   . Dizziness     or vertigo  . HLD (hyperlipidemia)   . Anxiety   . HTN (hypertension)     ROS:   All systems reviewed and negative except as noted in the HPI.   Past Surgical History  Procedure Date  . Cardiac catheterization 2003    normal  . Colonoscopy 2007    benign polyps, repeat in 5 years     Family History  Problem Relation Age of Onset  . Hodgkin's lymphoma Other   . Hypertension Other   . Hyperlipidemia Other   . Heart attack Other   . Prostate cancer Other     1st degree relative <50    . Diabetes Other     1st degree relative     History   Social History  . Marital Status: Married    Spouse Name: N/A    Number of Children: N/A  . Years of Education: N/A   Occupational History  . Not on file.   Social History Main Topics  . Smoking status: Former Games developer  . Smokeless tobacco: Not on file  . Alcohol Use: Yes  . Drug Use: No  . Sexually Active: Not on file   Other Topics Concern  . Not on file   Social History Narrative   Occupation: Airline pilot.      BP 128/76  Pulse 64  Resp 16  Ht 6\' 1"  (1.854 m)  Wt 249 lb (112.946 kg)  BMI 32.85 kg/m2  Physical Exam:  Well appearing middle-aged man, NAD HEENT: Unremarkable Neck:  No JVD, no thyromegally Lymphatics:  No adenopathy Back:  No CVA tenderness Lungs:  Clear no wheezes, rales, or rhonchi. HEART:  Regular rate rhythm, no murmurs, no rubs, no  clicks Abd:  soft, positive bowel sounds, no organomegally, no rebound, no guarding Ext:  2 plus pulses, no edema, no cyanosis, no clubbing Skin:  No rashes no nodules Neuro:  CN II through XII intact, motor grossly intact  EKG NSR with normal axis and intervals.  Assess/Plan:

## 2010-10-15 NOTE — Patient Instructions (Signed)
Your physician wants you to follow-up in: 6 months with Dr Taylor You will receive a reminder letter in the mail two months in advance. If you don't receive a letter, please call our office to schedule the follow-up appointment.  

## 2010-10-29 ENCOUNTER — Other Ambulatory Visit (INDEPENDENT_AMBULATORY_CARE_PROVIDER_SITE_OTHER): Payer: BC Managed Care – PPO

## 2010-10-29 DIAGNOSIS — Z0000 Encounter for general adult medical examination without abnormal findings: Secondary | ICD-10-CM

## 2010-10-29 LAB — CBC WITH DIFFERENTIAL/PLATELET
Eosinophils Absolute: 0.4 10*3/uL (ref 0.0–0.7)
Lymphocytes Relative: 21.2 % (ref 12.0–46.0)
MCHC: 33.3 g/dL (ref 30.0–36.0)
MCV: 90.5 fl (ref 78.0–100.0)
Monocytes Absolute: 0.5 10*3/uL (ref 0.1–1.0)
Neutrophils Relative %: 66.1 % (ref 43.0–77.0)
Platelets: 189 10*3/uL (ref 150.0–400.0)
RBC: 4.96 Mil/uL (ref 4.22–5.81)
WBC: 7.2 10*3/uL (ref 4.5–10.5)

## 2010-10-29 LAB — POCT URINALYSIS DIPSTICK
Blood, UA: NEGATIVE
Glucose, UA: NEGATIVE
Leukocytes, UA: NEGATIVE
Nitrite, UA: NEGATIVE
Urobilinogen, UA: 0.2

## 2010-10-29 LAB — BASIC METABOLIC PANEL
BUN: 16 mg/dL (ref 6–23)
CO2: 29 mEq/L (ref 19–32)
Calcium: 8.9 mg/dL (ref 8.4–10.5)
Chloride: 102 mEq/L (ref 96–112)
Creatinine, Ser: 1.1 mg/dL (ref 0.4–1.5)

## 2010-10-29 LAB — LIPID PANEL
Cholesterol: 168 mg/dL (ref 0–200)
Total CHOL/HDL Ratio: 4
VLDL: 47.2 mg/dL — ABNORMAL HIGH (ref 0.0–40.0)

## 2010-10-29 LAB — HEPATIC FUNCTION PANEL
Alkaline Phosphatase: 66 U/L (ref 39–117)
Bilirubin, Direct: 0.2 mg/dL (ref 0.0–0.3)
Total Bilirubin: 1 mg/dL (ref 0.3–1.2)

## 2010-10-29 LAB — TSH: TSH: 2.93 u[IU]/mL (ref 0.35–5.50)

## 2010-10-31 ENCOUNTER — Telehealth: Payer: Self-pay | Admitting: Family Medicine

## 2010-10-31 NOTE — Telephone Encounter (Signed)
Left voice message.

## 2010-10-31 NOTE — Telephone Encounter (Signed)
Message copied by Baldemar Friday on Thu Oct 31, 2010  4:09 PM ------      Message from: Gershon Crane A      Created: Thu Oct 31, 2010  9:08 AM       Normal except his glucose is higher than before, almost to the diabetes range. Watch a strict low calorie diet, and we will discuss at the cpx

## 2010-11-05 ENCOUNTER — Ambulatory Visit (INDEPENDENT_AMBULATORY_CARE_PROVIDER_SITE_OTHER): Payer: BC Managed Care – PPO | Admitting: Family Medicine

## 2010-11-05 ENCOUNTER — Encounter: Payer: Self-pay | Admitting: Family Medicine

## 2010-11-05 VITALS — BP 126/80 | HR 76 | Temp 98.4°F | Ht 72.75 in | Wt 246.0 lb

## 2010-11-05 DIAGNOSIS — Z0000 Encounter for general adult medical examination without abnormal findings: Secondary | ICD-10-CM

## 2010-11-05 MED ORDER — ATENOLOL 25 MG PO TABS: 25.0000 mg | ORAL_TABLET | Freq: Two times a day (BID) | ORAL | Status: DC

## 2010-11-05 MED ORDER — SIMVASTATIN 40 MG PO TABS: 40.0000 mg | ORAL_TABLET | Freq: Every day | ORAL | Status: DC

## 2010-11-05 MED ORDER — VENLAFAXINE HCL ER 150 MG PO CP24: 150.0000 mg | ORAL_CAPSULE | Freq: Every day | ORAL | Status: DC

## 2010-11-05 MED ORDER — CLOBETASOL PROPIONATE 0.05 % EX LIQD: Freq: Two times a day (BID) | CUTANEOUS | Status: AC

## 2010-11-05 MED ORDER — LISINOPRIL 5 MG PO TABS: 5.0000 mg | ORAL_TABLET | Freq: Every day | ORAL | Status: DC

## 2010-11-05 NOTE — Progress Notes (Signed)
  Subjective:    Patient ID: [name] [name], male    DOB: 1951-02-12, 60 y.o.   MRN: [id number]  HPI 60 yr old male for a cpx. He feels well and has no complaints. He saw Dr. Ladona Ridgel recently about another bout of paroxysmal atrial fib, and they decided to continue with meds and postpone any ablation plans for later. His recent labs show increases in his fasting glucose and lipids. He admits to not exercising and not eating a healthy diet.    Review of Systems  Constitutional: Negative.   HENT: Negative.   Eyes: Negative.   Respiratory: Negative.   Cardiovascular: Negative.   Gastrointestinal: Negative.   Genitourinary: Negative.   Musculoskeletal: Negative.   Skin: Negative.   Neurological: Negative.   Hematological: Negative.   Psychiatric/Behavioral: Negative.        Objective:   Physical Exam  Constitutional: He is oriented to person, place, and time. He appears well-developed and well-nourished. No distress.  HENT:  Head: Normocephalic and atraumatic.  Right Ear: External ear normal.  Left Ear: External ear normal.  Nose: Nose normal.  Mouth/Throat: Oropharynx is clear and moist. No oropharyngeal exudate.  Eyes: Conjunctivae and EOM are normal. Pupils are equal, round, and reactive to light. Right eye exhibits no discharge. Left eye exhibits no discharge. No scleral icterus.  Neck: Neck supple. No JVD present. No tracheal deviation present. No thyromegaly present.  Cardiovascular: Normal rate, regular rhythm, normal heart sounds and intact distal pulses.  Exam reveals no gallop and no friction rub.   No murmur heard. Pulmonary/Chest: Effort normal and breath sounds normal. No respiratory distress. He has no wheezes. He has no rales. He exhibits no tenderness.  Abdominal: Soft. Bowel sounds are normal. He exhibits no distension and no mass. There is no tenderness. There is no rebound and no guarding.  Genitourinary: Rectum normal, prostate normal and penis normal. Guaiac  negative stool. No penile tenderness.  Musculoskeletal: Normal range of motion. He exhibits no edema and no tenderness.  Lymphadenopathy:    He has no cervical adenopathy.  Neurological: He is alert and oriented to person, place, and time. He has normal reflexes. No cranial nerve deficit. He exhibits normal muscle tone. Coordination normal.  Skin: Skin is warm and dry. No rash noted. He is not diaphoretic. No erythema. No pallor.  Psychiatric: He has a normal mood and affect. His behavior is normal. Judgment and thought content normal.          Assessment & Plan:  He will exercise, watch the diet, and lose some weight. Recheck lipids and glucose in 6 months. He will set up another colonoscopy soon.

## 2011-01-16 ENCOUNTER — Telehealth: Payer: Self-pay | Admitting: Family Medicine

## 2011-01-16 MED ORDER — VENLAFAXINE HCL ER 150 MG PO CP24: 150.0000 mg | ORAL_CAPSULE | Freq: Every day | ORAL | Status: DC

## 2011-01-16 NOTE — Telephone Encounter (Signed)
Pt requested a 90-day supply

## 2011-02-17 ENCOUNTER — Telehealth: Payer: Self-pay

## 2011-02-17 MED ORDER — AZITHROMYCIN 250 MG PO TABS: ORAL_TABLET | ORAL | Status: AC

## 2011-02-17 NOTE — Telephone Encounter (Signed)
Per Dr. Clent Ridges, md will send and Rx into pharmacy and pt is aware

## 2011-02-17 NOTE — Telephone Encounter (Signed)
Pt would like for his Rx's to be filled with 90 supplies. He states that they were changed to 30 day supplies at his last visit but he wants 90 day.  Pt also c/o flu like sxs. He has low grade fever, ST, wheezing, chest congestion, and bodyaches that began over the weekend. Pt would like to see if MD will send an Rx to pharmacy. Please advise

## 2011-02-17 NOTE — Telephone Encounter (Signed)
Zpack sent e-scribe, still need to handle the rest of scripts.

## 2011-02-17 NOTE — Telephone Encounter (Signed)
Call in a Zpack. Also please do 90 day refills as requested

## 2011-02-19 NOTE — Telephone Encounter (Signed)
I tried to call pt and no answer or option to leave a voice message. Pt should notify pharmacy when he needs the refills and request a 90 day supply at that time.

## 2011-02-25 ENCOUNTER — Encounter: Payer: Self-pay | Admitting: Gastroenterology

## 2011-11-11 ENCOUNTER — Other Ambulatory Visit: Payer: Self-pay | Admitting: Family Medicine

## 2011-11-12 ENCOUNTER — Telehealth: Payer: Self-pay | Admitting: Family Medicine

## 2011-11-12 NOTE — Telephone Encounter (Signed)
He should not be running out of anything. We sent in 30 day supplies off all these except the Effexor (which was written for a one year supply last November). He just needs to see me in the next few weeks.

## 2011-11-12 NOTE — Telephone Encounter (Signed)
I left voice message with below information. 

## 2011-11-12 NOTE — Telephone Encounter (Signed)
[facility] Triage Call Report Triage Record Num: [id number] Operator: Griselda Miner Patient Name: [name] [name] Call Date & Time: 11/11/2011 5:45:54PM Patient Phone: [phone] PCP: Tera Mater. Clent Ridges Patient Gender: Male PCP Fax : [phone] Patient DOB: [birth year] Practice Name: [facility] Reason for Call: Caller: [name] [name]/Patient; PCP: Gershon Crane [facility]); CB#: [phone]; Call regarding refill requests; Patient calling requesting 90 day refills for Atenolol (Tab) TENORMIN 25 MG TAKE ONE TABLET BY MOUTH TWICE DAILY Lisinopril (Tab) PRINIVIL,ZESTRIL 5 MG TAKE ONE TABLET BY MOUTH EVERY DAY Simvastatin (Tab) ZOCOR 40 MG TAKE ONE TABLET BY MOUTH EVERY DAY Venlafaxine HCl (Capsule SR 24 hr) EFFEXOR-XR 150 MG Take 1 capsule (150 mg total) by mouth daily. Patient states that he has called once the office and is just getting concerned that he will run out before filled. Last appointment was 11/05/10-patient states he has not had time to schedule physical but that he will as soon as possible. Has meds through Friday 11/15/11. Dr. Clent Ridges manages his medications. It is noted when viewing medications in Epic medication requests have been put in for all requested meds except for Ventafaxine on 11/11/11 for 30 day supply with no refills.. Patient is not aware

## 2011-12-03 ENCOUNTER — Other Ambulatory Visit (INDEPENDENT_AMBULATORY_CARE_PROVIDER_SITE_OTHER): Payer: BC Managed Care – PPO

## 2011-12-03 DIAGNOSIS — Z0000 Encounter for general adult medical examination without abnormal findings: Secondary | ICD-10-CM

## 2011-12-03 LAB — POCT URINALYSIS DIPSTICK
Ketones, UA: NEGATIVE
Protein, UA: NEGATIVE
Spec Grav, UA: 1.02
pH, UA: 6.5

## 2011-12-03 LAB — HEPATIC FUNCTION PANEL
ALT: 45 U/L (ref 0–53)
AST: 43 U/L — ABNORMAL HIGH (ref 0–37)
Albumin: 4 g/dL (ref 3.5–5.2)
Total Bilirubin: 1.2 mg/dL (ref 0.3–1.2)
Total Protein: 6.7 g/dL (ref 6.0–8.3)

## 2011-12-03 LAB — LIPID PANEL
Cholesterol: 185 mg/dL (ref 0–200)
HDL: 42.8 mg/dL (ref >39.00)
VLDL: 28.4 mg/dL (ref 0.0–40.0)

## 2011-12-03 LAB — BASIC METABOLIC PANEL
Calcium: 9 mg/dL (ref 8.4–10.5)
GFR: 68.83 mL/min (ref >60.00)
Glucose, Bld: 107 mg/dL — ABNORMAL HIGH (ref 70–99)
Sodium: 137 mEq/L (ref 135–145)

## 2011-12-03 LAB — CBC WITH DIFFERENTIAL/PLATELET
Basophils Absolute: 0.1 10*3/uL (ref 0.0–0.1)
Hemoglobin: 15.2 g/dL (ref 13.0–17.0)
Lymphocytes Relative: 23.1 % (ref 12.0–46.0)
Monocytes Relative: 8 % (ref 3.0–12.0)
Neutro Abs: 4.8 10*3/uL (ref 1.4–7.7)
Platelets: 195 10*3/uL (ref 150.0–400.0)
RDW: 13.2 % (ref 11.5–14.6)
WBC: 7.6 10*3/uL (ref 4.5–10.5)

## 2011-12-04 ENCOUNTER — Encounter: Payer: Self-pay | Admitting: Family Medicine

## 2011-12-04 NOTE — Progress Notes (Signed)
Quick Note:  I tried to reach pt by phone, no answer. I put a copy of results in mail. ______ 

## 2011-12-08 ENCOUNTER — Encounter: Payer: Self-pay | Admitting: Gastroenterology

## 2011-12-10 ENCOUNTER — Ambulatory Visit (INDEPENDENT_AMBULATORY_CARE_PROVIDER_SITE_OTHER): Payer: BC Managed Care – PPO | Admitting: Family Medicine

## 2011-12-10 ENCOUNTER — Encounter: Payer: Self-pay | Admitting: Family Medicine

## 2011-12-10 VITALS — BP 116/78 | HR 64 | Temp 98.4°F | Ht 72.5 in | Wt 248.0 lb

## 2011-12-10 DIAGNOSIS — Z0000 Encounter for general adult medical examination without abnormal findings: Secondary | ICD-10-CM

## 2011-12-10 DIAGNOSIS — R3129 Other microscopic hematuria: Secondary | ICD-10-CM

## 2011-12-10 LAB — TESTOSTERONE: Testosterone: 190.12 ng/dL — ABNORMAL LOW (ref 350.00–890.00)

## 2011-12-10 MED ORDER — VENLAFAXINE HCL ER 150 MG PO CP24: 150.0000 mg | ORAL_CAPSULE | Freq: Every day | ORAL | Status: DC

## 2011-12-10 MED ORDER — LISINOPRIL 5 MG PO TABS: 5.0000 mg | ORAL_TABLET | Freq: Every day | ORAL | Status: DC

## 2011-12-10 MED ORDER — ATENOLOL 25 MG PO TABS: 25.0000 mg | ORAL_TABLET | Freq: Two times a day (BID) | ORAL | Status: DC

## 2011-12-10 MED ORDER — SIMVASTATIN 40 MG PO TABS: 40.0000 mg | ORAL_TABLET | Freq: Every day | ORAL | Status: DC

## 2011-12-10 NOTE — Progress Notes (Signed)
  Subjective:    Patient ID: [name] [name], male    DOB: 1951-02-23, 61 y.o.   MRN: [id number]  HPI 61 yr old male for a cpx. He feels well in general, although he admits to a 30 lb weight gain in the past year. He exercises regularly but his diet has not been very healthy. He also asks about erectile problems. He can get an erection but it may disappear quickly. No change in libido. He very rarely feels any palpitations now, and he seldom takes any Flecainide. He has not seen Dr. Ladona Ridgel for over a year, and he is past due for a colonoscopy.    Review of Systems  Constitutional: Negative.   HENT: Negative.   Eyes: Negative.   Respiratory: Negative.   Cardiovascular: Negative.   Gastrointestinal: Negative.   Genitourinary: Negative.   Musculoskeletal: Negative.   Skin: Negative.   Neurological: Negative.   Hematological: Negative.   Psychiatric/Behavioral: Negative.        Objective:   Physical Exam  Constitutional: He is oriented to person, place, and time. He appears well-developed and well-nourished. No distress.  HENT:  Head: Normocephalic and atraumatic.  Right Ear: External ear normal.  Left Ear: External ear normal.  Nose: Nose normal.  Mouth/Throat: Oropharynx is clear and moist. No oropharyngeal exudate.  Eyes: Conjunctivae normal and EOM are normal. Pupils are equal, round, and reactive to light. Right eye exhibits no discharge. Left eye exhibits no discharge. No scleral icterus.  Neck: Neck supple. No JVD present. No tracheal deviation present. No thyromegaly present.  Cardiovascular: Normal rate, regular rhythm, normal heart sounds and intact distal pulses.  Exam reveals no gallop and no friction rub.   No murmur heard.      EKG normal   Pulmonary/Chest: Effort normal and breath sounds normal. No respiratory distress. He has no wheezes. He has no rales. He exhibits no tenderness.  Abdominal: Soft. Bowel sounds are normal. He exhibits no distension and no mass. There  is no tenderness. There is no rebound and no guarding.  Genitourinary: Rectum normal, prostate normal and penis normal. Guaiac negative stool. No penile tenderness.  Musculoskeletal: Normal range of motion. He exhibits no edema and no tenderness.  Lymphadenopathy:    He has no cervical adenopathy.  Neurological: He is alert and oriented to person, place, and time. He has normal reflexes. No cranial nerve deficit. He exhibits normal muscle tone. Coordination normal.  Skin: Skin is warm and dry. No rash noted. He is not diaphoretic. No erythema. No pallor.  Psychiatric: He has a normal mood and affect. His behavior is normal. Judgment and thought content normal.          Assessment & Plan:  Well exam. He will contact GI for another colonoscopy. We will check a testosterone level today. He has some hematuria so we will have Urology take a look at this. He will set up another visit with Dr. Ladona Ridgel soon.

## 2011-12-12 NOTE — Progress Notes (Signed)
Quick Note:  I spoke with pt ______ 

## 2011-12-26 ENCOUNTER — Telehealth: Payer: Self-pay | Admitting: Family Medicine

## 2011-12-26 MED ORDER — TESTOSTERONE 4 MG/24HR TD PT24: 1.0000 | MEDICATED_PATCH | Freq: Every day | TRANSDERMAL | Status: DC

## 2011-12-26 NOTE — Telephone Encounter (Signed)
Caller: Umair/Patient; Patient Name: [name] [name]; PCP: Evelena Peat [facility]); Best Callback Phone Number: [phone] Had called office yesterday 12/25/2011 leaving message for Nettie Elm that insurance would cover the Testosterone medication being considered.  Asking if Rx had been ordered yet, called in.   Chart in EPIC gives order to be called in.  Would like medication called to [facility]  [phone].  Can reach patient at  [phone] as needed.

## 2011-12-26 NOTE — Telephone Encounter (Signed)
I called in script and left voice message for pt. 

## 2011-12-26 NOTE — Telephone Encounter (Signed)
Call in Androderm 4 mg patches to use daily for 6 months, then we will recheck a level

## 2011-12-26 NOTE — Telephone Encounter (Signed)
Pt left a voice message stating that his insurance will cover Androderm patch and send to [facility] in [place].

## 2012-05-21 ENCOUNTER — Encounter: Payer: Self-pay | Admitting: Gastroenterology

## 2012-06-18 ENCOUNTER — Ambulatory Visit (AMBULATORY_SURGERY_CENTER): Payer: BC Managed Care – PPO | Admitting: *Deleted

## 2012-06-18 VITALS — Ht 73.0 in | Wt 255.2 lb

## 2012-06-18 DIAGNOSIS — Z1211 Encounter for screening for malignant neoplasm of colon: Secondary | ICD-10-CM

## 2012-06-18 DIAGNOSIS — Z86010 Personal history of colonic polyps: Secondary | ICD-10-CM

## 2012-06-18 MED ORDER — MOVIPREP 100 G PO SOLR: 1.0000 | Freq: Once | ORAL | Status: DC

## 2012-06-18 NOTE — Progress Notes (Signed)
NO EGG OR SOY ALLERGY. EWM NO PROBLEMS WITH PAST SEDATIONS. EWM

## 2012-06-21 ENCOUNTER — Encounter: Payer: Self-pay | Admitting: Gastroenterology

## 2012-06-30 ENCOUNTER — Ambulatory Visit (AMBULATORY_SURGERY_CENTER): Payer: BC Managed Care – PPO | Admitting: Gastroenterology

## 2012-06-30 ENCOUNTER — Encounter: Payer: Self-pay | Admitting: Gastroenterology

## 2012-06-30 VITALS — BP 115/88 | HR 58 | Temp 98.2°F | Resp 19 | Ht 73.0 in | Wt 255.0 lb

## 2012-06-30 DIAGNOSIS — D126 Benign neoplasm of colon, unspecified: Secondary | ICD-10-CM

## 2012-06-30 DIAGNOSIS — Z860100 Personal history of colon polyps, unspecified: Secondary | ICD-10-CM

## 2012-06-30 DIAGNOSIS — Z1211 Encounter for screening for malignant neoplasm of colon: Secondary | ICD-10-CM

## 2012-06-30 HISTORY — PX: COLONOSCOPY: SHX174

## 2012-06-30 MED ORDER — SODIUM CHLORIDE 0.9 % IV SOLN: 500.0000 mL | INTRAVENOUS | Status: DC

## 2012-06-30 NOTE — Progress Notes (Signed)
Stable to RR 

## 2012-06-30 NOTE — Patient Instructions (Addendum)

## 2012-06-30 NOTE — Op Note (Signed)
[facility] [address] [place], [address]   COLONOSCOPY PROCEDURE REPORT  PATIENT: [name] [name], [name] [name]  MR#: [id number] BIRTHDATE: 07-24-1950 , 62  yrs. old GENDER: Male ENDOSCOPIST: Meryl Dare, MD, [facility] PROCEDURE DATE:  06/30/2012 PROCEDURE:   Colonoscopy with biopsy and snare polypectomy ASA CLASS:   Class II INDICATIONS:Patient's personal history of adenomatous colon polyps. 2007 MEDICATIONS: MAC sedation, administered by CRNA and propofol (Diprivan) 400mg  IV DESCRIPTION OF PROCEDURE:   After the risks benefits and alternatives of the procedure were thoroughly explained, informed consent was obtained.  A digital rectal exam revealed no abnormalities of the rectum.   The LB [id number]  endoscope was introduced through the anus and advanced to the cecum, which was identified by both the appendix and ileocecal valve. No adverse events experienced.   The quality of the prep was excellent, using MoviPrep  The instrument was then slowly withdrawn as the colon was fully examined.  COLON FINDINGS: Two sessile polyps measuring 3-4 mm in size were found at the cecum.  A polypectomy was performed with cold forceps. The resection was complete and the polyp tissue was completely retrieved.   Two sessile polyps measuring 6 mm in size were found in the ascending colon and transverse colon.  A polypectomy was performed with a cold snare.  The resection was complete and the polyp tissue was completely retrieved.   Mild diverticulosis was noted in the sigmoid colon.   The colon was otherwise normal. There was no diverticulosis, inflammation, polyps or cancers unless previously stated.  Retroflexed views revealed small internal hemorrhoids. The time to cecum=3 minutes 25 seconds.  Withdrawal time=11 minutes 09 seconds.  The scope was withdrawn and the procedure completed. COMPLICATIONS: There were no complications.  ENDOSCOPIC IMPRESSION: 1.   Two sessile  polyps measuring 3-4 mm at the cecum; polypectomy performed with cold forceps 2.   Two sessile polyps measuring 6 mm in the ascending and transverse colon; polypectomy performed with a cold snare 3.   Mild diverticulosis was noted in the sigmoid colon 4.   Small internal hemorrhoids  RECOMMENDATIONS: 1.  Await pathology results 2.  High fiber diet with liberal fluid intake. 3.  Repeat Colonoscopy in 5 years.   eSigned:  Meryl Dare, MD, [facility] 06/30/2012 10:59 AM      PATIENT NAME:  [name] [name], [name] [name] MR#: [id number]

## 2012-06-30 NOTE — Progress Notes (Signed)
Patient did not experience any of the following events: a burn prior to discharge; a fall within the facility; wrong site/side/patient/procedure/implant event; or a hospital transfer or hospital admission upon discharge from the facility. (G8907) Patient did not have preoperative order for IV antibiotic SSI prophylaxis. (G8918)  

## 2012-07-01 ENCOUNTER — Telehealth: Payer: Self-pay | Admitting: *Deleted

## 2012-07-01 NOTE — Telephone Encounter (Signed)
  Follow up Call-  Call back number 06/30/2012  Post procedure Call Back phone  # [phone] or cell [phone]  Permission to leave phone message Yes     Patient questions:  Do you have a fever, pain , or abdominal swelling? no Pain Score  0 *  Have you tolerated food without any problems? yes  Have you been able to return to your normal activities? yes  Do you have any questions about your discharge instructions: Diet   no Medications  no Follow up visit  no  Do you have questions or concerns about your Care? no  Actions: * If pain score is 4 or above: No action needed, pain <4.

## 2012-07-05 ENCOUNTER — Encounter: Payer: Self-pay | Admitting: Gastroenterology

## 2012-12-13 ENCOUNTER — Telehealth: Payer: Self-pay | Admitting: Family Medicine

## 2012-12-13 NOTE — Telephone Encounter (Signed)
Pt called to schedule his CPX in January. He is requesting a 90 day supply of the following medications; atenolol (TENORMIN) 25 MG tablet, venlafaxine XR (EFFEXOR XR) 150 MG 24 hr capsule, lisinopril (PRINIVIL,ZESTRIL) 5 MG tablet, and simvastatin (ZOCOR) 40 MG tablet. Pt states that he will be completely out tomorrow morning. He would like a 90 day supply send to the [facility] on [address]. Please assist.

## 2012-12-13 NOTE — Telephone Encounter (Signed)
Okay for all these refills

## 2012-12-14 ENCOUNTER — Other Ambulatory Visit: Payer: Self-pay | Admitting: Family Medicine

## 2012-12-14 MED ORDER — SIMVASTATIN 40 MG PO TABS: 40.0000 mg | ORAL_TABLET | Freq: Every day | ORAL | Status: DC

## 2012-12-14 MED ORDER — VENLAFAXINE HCL ER 150 MG PO CP24: 150.0000 mg | ORAL_CAPSULE | Freq: Every day | ORAL | Status: DC

## 2012-12-14 MED ORDER — LISINOPRIL 5 MG PO TABS: 5.0000 mg | ORAL_TABLET | Freq: Every day | ORAL | Status: DC

## 2012-12-14 MED ORDER — ATENOLOL 25 MG PO TABS: 25.0000 mg | ORAL_TABLET | Freq: Two times a day (BID) | ORAL | Status: DC

## 2012-12-14 NOTE — Telephone Encounter (Signed)
I sent all 4 scripts e-scribe and spoke with pt 

## 2012-12-14 NOTE — Telephone Encounter (Signed)
Can we refill these? 

## 2013-03-15 ENCOUNTER — Telehealth: Payer: Self-pay

## 2013-03-15 ENCOUNTER — Other Ambulatory Visit (INDEPENDENT_AMBULATORY_CARE_PROVIDER_SITE_OTHER): Payer: No Typology Code available for payment source

## 2013-03-15 DIAGNOSIS — Z0000 Encounter for general adult medical examination without abnormal findings: Secondary | ICD-10-CM

## 2013-03-15 LAB — POCT URINALYSIS DIPSTICK
Bilirubin, UA: NEGATIVE
Glucose, UA: NEGATIVE
Ketones, UA: NEGATIVE
LEUKOCYTES UA: NEGATIVE
Nitrite, UA: NEGATIVE
PH UA: 6.5
Protein, UA: NEGATIVE
Spec Grav, UA: 1.025
Urobilinogen, UA: 1

## 2013-03-15 LAB — CBC WITH DIFFERENTIAL/PLATELET
Basophils Absolute: 0 10*3/uL (ref 0.0–0.1)
Basophils Relative: 0.4 % (ref 0.0–3.0)
EOS PCT: 4.6 % (ref 0.0–5.0)
Eosinophils Absolute: 0.4 10*3/uL (ref 0.0–0.7)
HCT: 45.1 % (ref 39.0–52.0)
Hemoglobin: 15.3 g/dL (ref 13.0–17.0)
Lymphocytes Relative: 26.4 % (ref 12.0–46.0)
Lymphs Abs: 2.1 10*3/uL (ref 0.7–4.0)
MCHC: 33.9 g/dL (ref 30.0–36.0)
MCV: 88 fl (ref 78.0–100.0)
MONO ABS: 0.6 10*3/uL (ref 0.1–1.0)
MONOS PCT: 7.6 % (ref 3.0–12.0)
NEUTROS PCT: 61 % (ref 43.0–77.0)
Neutro Abs: 4.8 10*3/uL (ref 1.4–7.7)
PLATELETS: 190 10*3/uL (ref 150.0–400.0)
RBC: 5.13 Mil/uL (ref 4.22–5.81)
RDW: 13.4 % (ref 11.5–14.6)
WBC: 7.8 10*3/uL (ref 4.5–10.5)

## 2013-03-15 LAB — TSH: TSH: 3.71 u[IU]/mL (ref 0.35–5.50)

## 2013-03-15 LAB — BASIC METABOLIC PANEL
BUN: 18 mg/dL (ref 6–23)
CO2: 31 meq/L (ref 19–32)
Calcium: 8.9 mg/dL (ref 8.4–10.5)
Chloride: 100 mEq/L (ref 96–112)
Creatinine, Ser: 1.1 mg/dL (ref 0.4–1.5)
GFR: 72.91 mL/min (ref >60.00)
GLUCOSE: 112 mg/dL — AB (ref 70–99)
POTASSIUM: 4.5 meq/L (ref 3.5–5.1)
SODIUM: 137 meq/L (ref 135–145)

## 2013-03-15 LAB — HEPATIC FUNCTION PANEL
ALK PHOS: 65 U/L (ref 39–117)
ALT: 42 U/L (ref 0–53)
AST: 37 U/L (ref 0–37)
Albumin: 4 g/dL (ref 3.5–5.2)
Bilirubin, Direct: 0.2 mg/dL (ref 0.0–0.3)
TOTAL PROTEIN: 6.5 g/dL (ref 6.0–8.3)
Total Bilirubin: 1.1 mg/dL (ref 0.3–1.2)

## 2013-03-15 LAB — LIPID PANEL
Cholesterol: 190 mg/dL (ref 0–200)
HDL: 44.8 mg/dL (ref >39.00)
LDL Cholesterol: 110 mg/dL — ABNORMAL HIGH (ref 0–99)
Total CHOL/HDL Ratio: 4
Triglycerides: 177 mg/dL — ABNORMAL HIGH (ref 0.0–149.0)
VLDL: 35.4 mg/dL (ref 0.0–40.0)

## 2013-03-15 LAB — PSA: PSA: 1.12 ng/mL (ref 0.10–4.00)

## 2013-03-15 NOTE — Telephone Encounter (Signed)
[name] [name] has only 1 more day of medicine has appointment on Monday ask if there was any way at least a 1 month supply in for him to pick up at pharmacy since we have no samples  Atenolol 25 mg (2 tabs daily) Venlafaxine HCL ER 150 mg Lisinoprail 5 mg Simvastatin 40 mg

## 2013-03-16 MED ORDER — LISINOPRIL 5 MG PO TABS: 5.0000 mg | ORAL_TABLET | Freq: Every day | ORAL | Status: DC

## 2013-03-16 MED ORDER — VENLAFAXINE HCL ER 150 MG PO CP24: 150.0000 mg | ORAL_CAPSULE | Freq: Every day | ORAL | Status: DC

## 2013-03-16 MED ORDER — SIMVASTATIN 40 MG PO TABS: 40.0000 mg | ORAL_TABLET | Freq: Every day | ORAL | Status: DC

## 2013-03-16 MED ORDER — ATENOLOL 25 MG PO TABS: 25.0000 mg | ORAL_TABLET | Freq: Two times a day (BID) | ORAL | Status: DC

## 2013-03-16 NOTE — Telephone Encounter (Signed)
Per Dr. Sarajane Jews okay to send in all 4 scripts for a 30 day supply. I did send scripts e-scribe and left a voice message for pt.

## 2013-03-21 ENCOUNTER — Ambulatory Visit (INDEPENDENT_AMBULATORY_CARE_PROVIDER_SITE_OTHER): Payer: No Typology Code available for payment source | Admitting: Family Medicine

## 2013-03-21 ENCOUNTER — Encounter: Payer: Self-pay | Admitting: Family Medicine

## 2013-03-21 VITALS — BP 130/80 | HR 62 | Temp 98.8°F | Ht 72.0 in | Wt 250.0 lb

## 2013-03-21 DIAGNOSIS — Z0000 Encounter for general adult medical examination without abnormal findings: Secondary | ICD-10-CM

## 2013-03-21 MED ORDER — ATENOLOL 25 MG PO TABS: 25.0000 mg | ORAL_TABLET | Freq: Two times a day (BID) | ORAL | Status: DC

## 2013-03-21 MED ORDER — ATORVASTATIN CALCIUM 40 MG PO TABS: 40.0000 mg | ORAL_TABLET | Freq: Every day | ORAL | Status: DC

## 2013-03-21 MED ORDER — VENLAFAXINE HCL ER 150 MG PO CP24: 150.0000 mg | ORAL_CAPSULE | Freq: Every day | ORAL | Status: DC

## 2013-03-21 MED ORDER — LISINOPRIL 5 MG PO TABS: 5.0000 mg | ORAL_TABLET | Freq: Every day | ORAL | Status: DC

## 2013-03-21 NOTE — Progress Notes (Signed)
Pre visit review using our clinic review tool, if applicable. No additional management support is needed unless otherwise documented below in the visit note. 

## 2013-03-21 NOTE — Progress Notes (Signed)
   Subjective:    Patient ID: [name] [name], male    DOB: 01/26/1951, 63 y.o.   MRN: [id number]  HPI 63 yr old male for a cpx. He feels fine but he knows he needs to lose some weight.    Review of Systems  Constitutional: Negative.   HENT: Negative.   Eyes: Negative.   Respiratory: Negative.   Cardiovascular: Negative.   Gastrointestinal: Negative.   Genitourinary: Negative.   Musculoskeletal: Negative.   Skin: Negative.   Neurological: Negative.   Psychiatric/Behavioral: Negative.        Objective:   Physical Exam  Constitutional: He is oriented to person, place, and time. He appears well-developed and well-nourished. No distress.  HENT:  Head: Normocephalic and atraumatic.  Right Ear: External ear normal.  Left Ear: External ear normal.  Nose: Nose normal.  Mouth/Throat: Oropharynx is clear and moist. No oropharyngeal exudate.  Eyes: Conjunctivae and EOM are normal. Pupils are equal, round, and reactive to light. Right eye exhibits no discharge. Left eye exhibits no discharge. No scleral icterus.  Neck: Neck supple. No JVD present. No tracheal deviation present. No thyromegaly present.  Cardiovascular: Normal rate, regular rhythm, normal heart sounds and intact distal pulses.  Exam reveals no gallop and no friction rub.   No murmur heard. Pulmonary/Chest: Effort normal and breath sounds normal. No respiratory distress. He has no wheezes. He has no rales. He exhibits no tenderness.  Abdominal: Soft. Bowel sounds are normal. He exhibits no distension and no mass. There is no tenderness. There is no rebound and no guarding.  Genitourinary: Rectum normal, prostate normal and penis normal. Guaiac negative stool. No penile tenderness.  Musculoskeletal: Normal range of motion. He exhibits no edema and no tenderness.  Lymphadenopathy:    He has no cervical adenopathy.  Neurological: He is alert and oriented to person, place, and time. He has normal reflexes. No cranial nerve  deficit. He exhibits normal muscle tone. Coordination normal.  Skin: Skin is warm and dry. No rash noted. He is not diaphoretic. No erythema. No pallor.  Psychiatric: He has a normal mood and affect. His behavior is normal. Judgment and thought content normal.          Assessment & Plan:  Well exam. He needs to see Dr. Lovena Le soon

## 2013-06-13 ENCOUNTER — Telehealth: Payer: Self-pay | Admitting: Family Medicine

## 2013-06-13 NOTE — Telephone Encounter (Signed)
[id number] - [place], [place] is requesting re-fill on VIAGRA 100 MG tablet

## 2013-06-13 NOTE — Telephone Encounter (Signed)
I  dont see that Dr.   Sarajane Jews  Has written this medication  Please advise   If so can refilll Otherwise  Wait for dr Sarajane Jews to decide on rx.

## 2013-06-14 NOTE — Telephone Encounter (Signed)
I left a voice message advising that I would forward this note to Dr. Sarajane Jews, he will be back in the office on 06/20/13.

## 2013-06-21 MED ORDER — SILDENAFIL CITRATE 100 MG PO TABS: 100.0000 mg | ORAL_TABLET | Freq: Every day | ORAL | Status: DC | PRN

## 2013-06-21 NOTE — Telephone Encounter (Signed)
I sent script e-scribe and left a voice message for pt. 

## 2013-06-21 NOTE — Telephone Encounter (Signed)
Call this in to use prn, #10 with 11 rf

## 2014-03-21 ENCOUNTER — Telehealth: Payer: Self-pay | Admitting: Family Medicine

## 2014-03-21 NOTE — Telephone Encounter (Signed)
Pt has made cpe for 2/2/1.  However, pt is out of his meds and would like 30 day sent  To [facility], [address] in [place], [place]  lisinopril (PRINIVIL,ZESTRIL) 5 MG tablet atorvastatin (LIPITOR) 40 MG tablet atenolol (TENORMIN) 25 MG tablet venlafaxine XR (EFFEXOR XR) 150 MG 24 hr capsule

## 2014-03-22 ENCOUNTER — Other Ambulatory Visit: Payer: Self-pay | Admitting: Family Medicine

## 2014-03-23 NOTE — Telephone Encounter (Signed)
All 4 scripts were sent e-scribe.

## 2014-03-23 NOTE — Telephone Encounter (Signed)
Per Dr. Sarajane Jews okay to refill all 4 scripts, which I did send e-scribe to [facility].

## 2014-03-23 NOTE — Telephone Encounter (Signed)
Pt following up on request. Pt leaving to go out of town at 2 pm and needs before he leaves if possible.

## 2014-04-05 ENCOUNTER — Other Ambulatory Visit (INDEPENDENT_AMBULATORY_CARE_PROVIDER_SITE_OTHER): Payer: No Typology Code available for payment source

## 2014-04-05 DIAGNOSIS — Z0000 Encounter for general adult medical examination without abnormal findings: Secondary | ICD-10-CM

## 2014-04-05 LAB — CBC WITH DIFFERENTIAL/PLATELET
BASOS ABS: 0 10*3/uL (ref 0.0–0.1)
BASOS PCT: 0.6 % (ref 0.0–3.0)
EOS PCT: 5.1 % — AB (ref 0.0–5.0)
Eosinophils Absolute: 0.4 10*3/uL (ref 0.0–0.7)
HCT: 43.6 % (ref 39.0–52.0)
Hemoglobin: 15 g/dL (ref 13.0–17.0)
LYMPHS ABS: 1.9 10*3/uL (ref 0.7–4.0)
Lymphocytes Relative: 24.7 % (ref 12.0–46.0)
MCHC: 34.3 g/dL (ref 30.0–36.0)
MCV: 86.9 fl (ref 78.0–100.0)
Monocytes Absolute: 0.6 10*3/uL (ref 0.1–1.0)
Monocytes Relative: 8 % (ref 3.0–12.0)
NEUTROS ABS: 4.8 10*3/uL (ref 1.4–7.7)
Neutrophils Relative %: 61.6 % (ref 43.0–77.0)
PLATELETS: 199 10*3/uL (ref 150.0–400.0)
RBC: 5.02 Mil/uL (ref 4.22–5.81)
RDW: 13.7 % (ref 11.5–15.5)
WBC: 7.7 10*3/uL (ref 4.0–10.5)

## 2014-04-05 LAB — BASIC METABOLIC PANEL
BUN: 16 mg/dL (ref 6–23)
CO2: 31 mEq/L (ref 19–32)
Calcium: 9.1 mg/dL (ref 8.4–10.5)
Chloride: 101 mEq/L (ref 96–112)
Creatinine, Ser: 1.09 mg/dL (ref 0.40–1.50)
GFR: 72.66 mL/min (ref >60.00)
Glucose, Bld: 137 mg/dL — ABNORMAL HIGH (ref 70–99)
POTASSIUM: 4.2 meq/L (ref 3.5–5.1)
Sodium: 137 mEq/L (ref 135–145)

## 2014-04-05 LAB — POCT URINALYSIS DIPSTICK
GLUCOSE UA: NEGATIVE
Ketones, UA: NEGATIVE
LEUKOCYTES UA: NEGATIVE
Nitrite, UA: NEGATIVE
PH UA: 5.5
PROTEIN UA: NEGATIVE
SPEC GRAV UA: 1.025
UROBILINOGEN UA: 1

## 2014-04-05 LAB — LIPID PANEL
CHOL/HDL RATIO: 4
CHOLESTEROL: 172 mg/dL (ref 0–200)
HDL: 41.7 mg/dL (ref >39.00)
LDL Cholesterol: 94 mg/dL (ref 0–99)
NonHDL: 130.3
TRIGLYCERIDES: 180 mg/dL — AB (ref 0.0–149.0)
VLDL: 36 mg/dL (ref 0.0–40.0)

## 2014-04-05 LAB — HEPATIC FUNCTION PANEL
ALT: 33 U/L (ref 0–53)
AST: 29 U/L (ref 0–37)
Albumin: 4 g/dL (ref 3.5–5.2)
Alkaline Phosphatase: 83 U/L (ref 39–117)
Bilirubin, Direct: 0.2 mg/dL (ref 0.0–0.3)
Total Bilirubin: 1 mg/dL (ref 0.2–1.2)
Total Protein: 6.6 g/dL (ref 6.0–8.3)

## 2014-04-05 LAB — PSA: PSA: 1.29 ng/mL (ref 0.10–4.00)

## 2014-04-05 LAB — TSH: TSH: 3.89 u[IU]/mL (ref 0.35–4.50)

## 2014-04-11 ENCOUNTER — Encounter: Payer: Self-pay | Admitting: Family Medicine

## 2014-04-11 ENCOUNTER — Ambulatory Visit (INDEPENDENT_AMBULATORY_CARE_PROVIDER_SITE_OTHER): Payer: No Typology Code available for payment source | Admitting: Family Medicine

## 2014-04-11 VITALS — BP 134/80 | HR 64 | Temp 98.7°F | Ht 72.0 in | Wt 258.0 lb

## 2014-04-11 DIAGNOSIS — E119 Type 2 diabetes mellitus without complications: Secondary | ICD-10-CM

## 2014-04-11 DIAGNOSIS — Z0000 Encounter for general adult medical examination without abnormal findings: Secondary | ICD-10-CM

## 2014-04-11 LAB — MICROALBUMIN / CREATININE URINE RATIO
CREATININE, U: 190.2 mg/dL
Microalb Creat Ratio: 0.4 mg/g (ref 0.0–30.0)
Microalb, Ur: 0.7 mg/dL (ref 0.0–1.9)

## 2014-04-11 LAB — HEMOGLOBIN A1C: Hgb A1c MFr Bld: 7.2 % — ABNORMAL HIGH (ref 4.6–6.5)

## 2014-04-11 MED ORDER — PHENTERMINE HCL 37.5 MG PO CAPS: 37.5000 mg | ORAL_CAPSULE | ORAL | Status: DC

## 2014-04-11 NOTE — Progress Notes (Signed)
Pre visit review using our clinic review tool, if applicable. No additional management support is needed unless otherwise documented below in the visit note. 

## 2014-04-11 NOTE — Progress Notes (Signed)
   Subjective:    Patient ID: [name] [name], male    DOB: Nov 25, 1950, 64 y.o.   MRN: [id number]  HPI 64 yr old male for a cpx. He feels well but he realizes that he is very overweight. He has decided that he will make big changes to his diet and he plans to lose 30-40 lbs. His recent labs show he has now developed type 2 diabetes.    Review of Systems  Constitutional: Negative.   HENT: Negative.   Eyes: Negative.   Respiratory: Negative.   Cardiovascular: Negative.   Gastrointestinal: Negative.   Genitourinary: Negative.   Musculoskeletal: Negative.   Skin: Negative.   Neurological: Negative.   Psychiatric/Behavioral: Negative.        Objective:   Physical Exam  Constitutional: He is oriented to person, place, and time. He appears well-developed and well-nourished. No distress.  HENT:  Head: Normocephalic and atraumatic.  Right Ear: External ear normal.  Left Ear: External ear normal.  Nose: Nose normal.  Mouth/Throat: Oropharynx is clear and moist. No oropharyngeal exudate.  Eyes: Conjunctivae and EOM are normal. Pupils are equal, round, and reactive to light. Right eye exhibits no discharge. Left eye exhibits no discharge. No scleral icterus.  Neck: Neck supple. No JVD present. No tracheal deviation present. No thyromegaly present.  Cardiovascular: Normal rate, regular rhythm, normal heart sounds and intact distal pulses.  Exam reveals no gallop and no friction rub.   No murmur heard. Pulmonary/Chest: Effort normal and breath sounds normal. No respiratory distress. He has no wheezes. He has no rales. He exhibits no tenderness.  Abdominal: Soft. Bowel sounds are normal. He exhibits no distension and no mass. There is no tenderness. There is no rebound and no guarding.  Genitourinary: Rectum normal, prostate normal and penis normal. Guaiac negative stool. No penile tenderness.  Musculoskeletal: Normal range of motion. He exhibits no edema or tenderness.  Lymphadenopathy:   He has no cervical adenopathy.  Neurological: He is alert and oriented to person, place, and time. He has normal reflexes. No cranial nerve deficit. He exhibits normal muscle tone. Coordination normal.  Skin: Skin is warm and dry. No rash noted. He is not diaphoretic. No erythema. No pallor.  Psychiatric: He has a normal mood and affect. His behavior is normal. Judgment and thought content normal.          Assessment & Plan:  Well exam. We spent 30 minutes discussing diabetes and its treatment. We will send him for a baseline A1c today. Refer to Nutrition for education. Try Phentermine to reduce appetite. Recheck in 90 days.

## 2014-04-13 ENCOUNTER — Telehealth: Payer: Self-pay | Admitting: Family Medicine

## 2014-04-13 NOTE — Telephone Encounter (Signed)
Pt would like blood work results from tuesday

## 2014-04-14 NOTE — Telephone Encounter (Signed)
We are waiting on results, will call when ready.

## 2014-04-17 ENCOUNTER — Ambulatory Visit: Payer: No Typology Code available for payment source | Admitting: Dietician

## 2014-04-18 ENCOUNTER — Telehealth: Payer: Self-pay | Admitting: Family Medicine

## 2014-04-18 ENCOUNTER — Other Ambulatory Visit: Payer: Self-pay | Admitting: Family Medicine

## 2014-04-18 DIAGNOSIS — E119 Type 2 diabetes mellitus without complications: Secondary | ICD-10-CM

## 2014-04-18 NOTE — Telephone Encounter (Signed)
Pt advised to fu w/ another a1c in 3 months. scheduled pt,  Can you put the order in?

## 2014-04-18 NOTE — Telephone Encounter (Signed)
I put future lab order in computer.

## 2014-04-19 ENCOUNTER — Other Ambulatory Visit: Payer: Self-pay | Admitting: Family Medicine

## 2014-04-22 ENCOUNTER — Other Ambulatory Visit: Payer: Self-pay | Admitting: Family Medicine

## 2014-04-28 ENCOUNTER — Ambulatory Visit: Payer: No Typology Code available for payment source | Admitting: Dietician

## 2014-05-05 ENCOUNTER — Ambulatory Visit: Payer: No Typology Code available for payment source | Admitting: Dietician

## 2014-06-24 ENCOUNTER — Other Ambulatory Visit: Payer: Self-pay | Admitting: Family Medicine

## 2014-06-26 ENCOUNTER — Telehealth: Payer: Self-pay | Admitting: Family Medicine

## 2014-06-26 NOTE — Telephone Encounter (Signed)
Per Dr. Fry okay to refill 

## 2014-06-26 NOTE — Telephone Encounter (Signed)
Patient called stating he is out of medication.  The re-fill request was sent on 06/24/14 by [facility] electronically.

## 2014-06-30 NOTE — Telephone Encounter (Signed)
Medication was approved  

## 2014-07-11 ENCOUNTER — Other Ambulatory Visit (INDEPENDENT_AMBULATORY_CARE_PROVIDER_SITE_OTHER): Payer: No Typology Code available for payment source

## 2014-07-11 DIAGNOSIS — E119 Type 2 diabetes mellitus without complications: Secondary | ICD-10-CM | POA: Diagnosis not present

## 2014-07-11 LAB — HEMOGLOBIN A1C: HEMOGLOBIN A1C: 6.5 % (ref 4.6–6.5)

## 2014-09-24 ENCOUNTER — Other Ambulatory Visit: Payer: Self-pay | Admitting: Family Medicine

## 2015-05-15 ENCOUNTER — Other Ambulatory Visit: Payer: Self-pay | Admitting: Family Medicine

## 2015-05-15 MED ORDER — ATORVASTATIN CALCIUM 40 MG PO TABS: 40.0000 mg | ORAL_TABLET | Freq: Every day | ORAL | Status: DC

## 2015-05-15 MED ORDER — ATENOLOL 25 MG PO TABS: 25.0000 mg | ORAL_TABLET | Freq: Two times a day (BID) | ORAL | Status: DC

## 2015-05-15 MED ORDER — LISINOPRIL 5 MG PO TABS: 5.0000 mg | ORAL_TABLET | Freq: Every day | ORAL | Status: DC

## 2015-05-15 NOTE — Telephone Encounter (Signed)
Pt request refill  atorvastatin (LIPITOR) 40 MG tablet   (pt is out and needs today if possible) atenolol (TENORMIN) 25 MG tablet lisinopril (PRINIVIL,ZESTRIL) 5 MG tablet [facility]/ [place], [place]  Pt has made appointment for cpe in April, but has no more refills .

## 2015-05-15 NOTE — Telephone Encounter (Signed)
Rx was send  

## 2015-06-28 ENCOUNTER — Other Ambulatory Visit (INDEPENDENT_AMBULATORY_CARE_PROVIDER_SITE_OTHER): Payer: Managed Care, Other (non HMO)

## 2015-06-28 DIAGNOSIS — Z0000 Encounter for general adult medical examination without abnormal findings: Secondary | ICD-10-CM

## 2015-06-28 LAB — HEPATIC FUNCTION PANEL
ALT: 22 U/L (ref 0–53)
AST: 19 U/L (ref 0–37)
Albumin: 4.1 g/dL (ref 3.5–5.2)
Alkaline Phosphatase: 84 U/L (ref 39–117)
BILIRUBIN DIRECT: 0.2 mg/dL (ref 0.0–0.3)
BILIRUBIN TOTAL: 1 mg/dL (ref 0.2–1.2)
Total Protein: 6.4 g/dL (ref 6.0–8.3)

## 2015-06-28 LAB — BASIC METABOLIC PANEL
BUN: 18 mg/dL (ref 6–23)
CALCIUM: 9.4 mg/dL (ref 8.4–10.5)
CO2: 32 mEq/L (ref 19–32)
CREATININE: 1.08 mg/dL (ref 0.40–1.50)
Chloride: 101 mEq/L (ref 96–112)
GFR: 73.15 mL/min (ref >60.00)
Glucose, Bld: 121 mg/dL — ABNORMAL HIGH (ref 70–99)
Potassium: 5.1 mEq/L (ref 3.5–5.1)
Sodium: 138 mEq/L (ref 135–145)

## 2015-06-28 LAB — LIPID PANEL
CHOL/HDL RATIO: 4
Cholesterol: 147 mg/dL (ref 0–200)
HDL: 41.9 mg/dL (ref >39.00)
LDL CALC: 82 mg/dL (ref 0–99)
NonHDL: 104.82
TRIGLYCERIDES: 113 mg/dL (ref 0.0–149.0)
VLDL: 22.6 mg/dL (ref 0.0–40.0)

## 2015-06-28 LAB — CBC WITH DIFFERENTIAL/PLATELET
BASOS PCT: 0.9 % (ref 0.0–3.0)
Basophils Absolute: 0.1 10*3/uL (ref 0.0–0.1)
EOS ABS: 0.4 10*3/uL (ref 0.0–0.7)
Eosinophils Relative: 5.4 % — ABNORMAL HIGH (ref 0.0–5.0)
HCT: 46.1 % (ref 39.0–52.0)
HEMOGLOBIN: 15.6 g/dL (ref 13.0–17.0)
Lymphocytes Relative: 23.5 % (ref 12.0–46.0)
Lymphs Abs: 1.9 10*3/uL (ref 0.7–4.0)
MCHC: 33.7 g/dL (ref 30.0–36.0)
MCV: 87 fl (ref 78.0–100.0)
MONO ABS: 0.6 10*3/uL (ref 0.1–1.0)
Monocytes Relative: 7.8 % (ref 3.0–12.0)
Neutro Abs: 5.1 10*3/uL (ref 1.4–7.7)
Neutrophils Relative %: 62.4 % (ref 43.0–77.0)
Platelets: 208 10*3/uL (ref 150.0–400.0)
RBC: 5.3 Mil/uL (ref 4.22–5.81)
RDW: 14.1 % (ref 11.5–15.5)
WBC: 8.2 10*3/uL (ref 4.0–10.5)

## 2015-06-28 LAB — POC URINALSYSI DIPSTICK (AUTOMATED)
GLUCOSE UA: NEGATIVE
KETONES UA: NEGATIVE
LEUKOCYTES UA: NEGATIVE
Nitrite, UA: NEGATIVE
Protein, UA: NEGATIVE
Spec Grav, UA: >=1.03
Urobilinogen, UA: 1
pH, UA: 6

## 2015-06-28 LAB — PSA: PSA: 1.14 ng/mL (ref 0.10–4.00)

## 2015-06-28 LAB — TSH: TSH: 3.42 u[IU]/mL (ref 0.35–4.50)

## 2015-07-05 ENCOUNTER — Encounter: Payer: Self-pay | Admitting: Family Medicine

## 2015-07-05 ENCOUNTER — Ambulatory Visit (INDEPENDENT_AMBULATORY_CARE_PROVIDER_SITE_OTHER): Payer: Managed Care, Other (non HMO) | Admitting: Family Medicine

## 2015-07-05 VITALS — BP 124/90 | Temp 98.3°F | Ht 72.0 in | Wt 247.0 lb

## 2015-07-05 DIAGNOSIS — Z0000 Encounter for general adult medical examination without abnormal findings: Secondary | ICD-10-CM | POA: Diagnosis not present

## 2015-07-05 DIAGNOSIS — E119 Type 2 diabetes mellitus without complications: Secondary | ICD-10-CM

## 2015-07-05 LAB — POCT GLYCOSYLATED HEMOGLOBIN (HGB A1C): Hemoglobin A1C: 6.5

## 2015-07-05 MED ORDER — ATORVASTATIN CALCIUM 40 MG PO TABS: 40.0000 mg | ORAL_TABLET | Freq: Every day | ORAL | Status: DC

## 2015-07-05 MED ORDER — VENLAFAXINE HCL ER 150 MG PO CP24: 150.0000 mg | ORAL_CAPSULE | Freq: Every day | ORAL | Status: DC

## 2015-07-05 MED ORDER — LISINOPRIL 5 MG PO TABS: 5.0000 mg | ORAL_TABLET | Freq: Every day | ORAL | Status: DC

## 2015-07-05 MED ORDER — SILDENAFIL CITRATE 20 MG PO TABS: 100.0000 mg | ORAL_TABLET | Freq: Once | ORAL | Status: DC | PRN

## 2015-07-05 MED ORDER — ATENOLOL 25 MG PO TABS: 25.0000 mg | ORAL_TABLET | Freq: Two times a day (BID) | ORAL | Status: DC

## 2015-07-05 NOTE — Addendum Note (Signed)
Addended by: Aggie Hacker A on: 07/05/2015 12:25 PM   Modules accepted: Orders

## 2015-07-05 NOTE — Progress Notes (Signed)
Pre visit review using our clinic review tool, if applicable. No additional management support is needed unless otherwise documented below in the visit note. 

## 2015-07-05 NOTE — Progress Notes (Signed)
   Subjective:    Patient ID: [name] [name], male    DOB: 1951/01/08, 65 y.o.   MRN: [id number]  HPI 65 yr old male for a cpx. He feels well. He knows he is overweight and he and his wife are thinking about joining [employer] together.    Review of Systems  Constitutional: Negative.   HENT: Negative.   Eyes: Negative.   Respiratory: Negative.   Cardiovascular: Negative.   Gastrointestinal: Negative.   Genitourinary: Negative.   Musculoskeletal: Negative.   Skin: Negative.   Neurological: Negative.   Psychiatric/Behavioral: Negative.        Objective:   Physical Exam  Constitutional: He is oriented to person, place, and time. He appears well-developed and well-nourished. No distress.  HENT:  Head: Normocephalic and atraumatic.  Right Ear: External ear normal.  Left Ear: External ear normal.  Nose: Nose normal.  Mouth/Throat: Oropharynx is clear and moist. No oropharyngeal exudate.  Eyes: Conjunctivae and EOM are normal. Pupils are equal, round, and reactive to light. Right eye exhibits no discharge. Left eye exhibits no discharge. No scleral icterus.  Neck: Neck supple. No JVD present. No tracheal deviation present. No thyromegaly present.  Cardiovascular: Normal rate, regular rhythm, normal heart sounds and intact distal pulses.  Exam reveals no gallop and no friction rub.   No murmur heard. EKG normal   Pulmonary/Chest: Effort normal and breath sounds normal. No respiratory distress. He has no wheezes. He has no rales. He exhibits no tenderness.  Abdominal: Soft. Bowel sounds are normal. He exhibits no distension and no mass. There is no tenderness. There is no rebound and no guarding.  Genitourinary: Rectum normal, prostate normal and penis normal. Guaiac negative stool. No penile tenderness.  Musculoskeletal: Normal range of motion. He exhibits no edema or tenderness.  Lymphadenopathy:    He has no cervical adenopathy.  Neurological: He is alert and oriented to  person, place, and time. He has normal reflexes. No cranial nerve deficit. He exhibits normal muscle tone. Coordination normal.  Skin: Skin is warm and dry. No rash noted. He is not diaphoretic. No erythema. No pallor.  Psychiatric: He has a normal mood and affect. His behavior is normal. Judgment and thought content normal.          Assessment & Plan:  Well exam. We discussed diet and exercise. Check an A1c today. Laurey Morale, MD

## 2015-08-28 ENCOUNTER — Telehealth: Payer: Self-pay | Admitting: *Deleted

## 2015-08-28 NOTE — Telephone Encounter (Signed)
Prior authorization request received for Sildenafil 20mg  from [employer].  PA completed through CoverMyMeds.  Key: [id number]

## 2015-09-03 NOTE — Telephone Encounter (Signed)
Will they pay for Cialis?

## 2015-09-03 NOTE — Telephone Encounter (Signed)
PA for Sildenafil 20mg  has been denied.  Letter from [place] given to sylvia for MD to review.

## 2015-09-04 NOTE — Telephone Encounter (Signed)
The insurance company didn't state any alternative medications, so there's no way to know prior to sending an Rx.

## 2015-09-06 NOTE — Telephone Encounter (Signed)
Call in Cialis 20 mg to use prn, #10 with 11 rf 

## 2015-09-07 MED ORDER — TADALAFIL 20 MG PO TABS: 20.0000 mg | ORAL_TABLET | Freq: Every day | ORAL | Status: DC | PRN

## 2015-09-07 NOTE — Telephone Encounter (Signed)
Refill sent to pharmacy.   

## 2015-10-31 ENCOUNTER — Other Ambulatory Visit: Payer: Self-pay | Admitting: *Deleted

## 2015-10-31 MED ORDER — ATENOLOL 25 MG PO TABS: 25.0000 mg | ORAL_TABLET | Freq: Two times a day (BID) | ORAL | 3 refills | Status: DC

## 2016-03-31 ENCOUNTER — Telehealth: Payer: Self-pay | Admitting: Internal Medicine

## 2016-03-31 ENCOUNTER — Telehealth: Payer: Self-pay | Admitting: Family Medicine

## 2016-03-31 NOTE — Telephone Encounter (Signed)
Pt would like a refill of a med for Arrhythmia.  Pt does not know what med it is. Pt had an episode several yrs ago, and dr Sarajane Jews prescribed something.  Now pt had another episode on Sunday, 1/21 that lasted 12 hours. Pt would like one refill in case it happens again.  Pt has appt with cardiologist March 6, Dr Lovena Le, and will schedule his cpe for March as well.  Please advsie  [facility] [address]

## 2016-03-31 NOTE — Telephone Encounter (Signed)
[name] [name] is calling because he needs a refill on medication prescribed by Dr. Lovena Le, could not remember the medication, would like to speak with someone about this. Thanks.

## 2016-03-31 NOTE — Telephone Encounter (Signed)
PT DOES NO HAVE ANY MEDICATION REFILLED BY DR Lovena Le. PT AWARE

## 2016-04-02 NOTE — Telephone Encounter (Signed)
I spoke with pt and went over below information, he will contact cardiology.

## 2016-04-02 NOTE — Telephone Encounter (Signed)
If he is referring to Flecanide, I do not prescribe this. It needs to come from Cardiology. He may want to see Dr. Lovena Le sooner than March.

## 2016-04-02 NOTE — Telephone Encounter (Signed)
I tried to reach pt and no answer, I will try again later on today.

## 2016-05-13 ENCOUNTER — Ambulatory Visit: Payer: Managed Care, Other (non HMO) | Admitting: Internal Medicine

## 2016-05-13 ENCOUNTER — Encounter: Payer: Self-pay | Admitting: Internal Medicine

## 2016-05-13 ENCOUNTER — Encounter (INDEPENDENT_AMBULATORY_CARE_PROVIDER_SITE_OTHER): Payer: Self-pay

## 2016-05-13 ENCOUNTER — Ambulatory Visit (INDEPENDENT_AMBULATORY_CARE_PROVIDER_SITE_OTHER): Payer: 59 | Admitting: Internal Medicine

## 2016-05-13 VITALS — BP 128/78 | HR 56 | Ht 72.0 in | Wt 252.0 lb

## 2016-05-13 DIAGNOSIS — I4891 Unspecified atrial fibrillation: Secondary | ICD-10-CM

## 2016-05-13 MED ORDER — RIVAROXABAN 20 MG PO TABS: 20.0000 mg | ORAL_TABLET | Freq: Every day | ORAL | 6 refills | Status: DC

## 2016-05-13 MED ORDER — FLECAINIDE ACETATE 100 MG PO TABS: ORAL_TABLET | ORAL | 5 refills | Status: DC

## 2016-05-13 NOTE — Progress Notes (Signed)
HPI [name] returns today after a 5 year absence from our EP clinic. He is a pleasant almost 66 yo man with HTN who developed PAF several years ago. Since then he has only had rare symptoms. About a month ago he had an episode which lasted for 12 hours before stopping. He did not seek medical attention. He had been prescribed flecainide to be used as a pill in the pocket years ago but did not use them and through the meds away. He continues to take atenolol and lisinopril for BP control. He denies chest pain or sob. No syncope and no edema. He turns 66 in a few weeks. Allergies  Allergen Reactions  . Sulfa Antibiotics Hives  . Paroxetine Other (See Comments)    NOT ABLE TO TOLERATE     Current Outpatient Prescriptions  Medication Sig Dispense Refill  . aspirin 325 MG tablet Take 325 mg by mouth daily.      Marland Kitchen atenolol (TENORMIN) 25 MG tablet Take 1 tablet (25 mg total) by mouth 2 (two) times daily. 180 tablet 3  . atorvastatin (LIPITOR) 40 MG tablet Take 1 tablet (40 mg total) by mouth daily. 90 tablet 3  . Calcium Carbonate-Vitamin D 600-400 MG-UNIT per tablet Take 1 tablet by mouth daily.      Marland Kitchen lisinopril (PRINIVIL,ZESTRIL) 5 MG tablet Take 1 tablet (5 mg total) by mouth daily. 90 tablet 3  . venlafaxine XR (EFFEXOR-XR) 150 MG 24 hr capsule Take 1 capsule (150 mg total) by mouth daily. 90 capsule 3   No current facility-administered medications for this visit.      Past Medical History:  Diagnosis Date  . Anxiety   . Atrial fibrillation [facility])    sees Dr. Lovena Le  . Dizziness    or vertigo  . GERD (gastroesophageal reflux disease)   . HLD (hyperlipidemia)   . HTN (hypertension)   . Osteopenia   . Sinus bradycardia     ROS:   All systems reviewed and negative except as noted in the HPI.   Past Surgical History:  Procedure Laterality Date  . CARDIAC CATHETERIZATION  2003   normal  . COLONOSCOPY  06-30-12   per Dr. Fuller Plan, adenomatous  polyps, repeat in 5 years   . POLYPECTOMY    . RETINAL LASER PROCEDURE Right    repair retinal tear   . WART REMOVAL     LEFT UPPER ARM  . WISDOM TOOTH EXTRACTION       Family History  Problem Relation Age of Onset  . Hodgkin's lymphoma Father   . Hypertension Brother   . Hyperlipidemia Other   . Heart attack Brother   . Prostate cancer Brother     1st degree relative <50  . Diabetes Brother     1st degree relative  . Hypertension Brother   . Hypertension Mother   . Stroke Mother   . Colon cancer Neg Hx   . Multiple myeloma Brother   . Heart attack Brother      Social History   Social History  . Marital status: Married    Spouse name: N/A  . Number of children: N/A  . Years of education: N/A   Occupational History  . Not on file.   Social History Main Topics  . Smoking status: Former Smoker    Quit date: 03/11/1975  . Smokeless tobacco: Never Used  . Alcohol use 0.0 oz/week     Comment: couple times a month-SOCIALLY 1  BEER A MONTH  . Drug use: No  . Sexual activity: Not on file   Other Topics Concern  . Not on file   Social History Narrative   Occupation: Optometrist.      BP 128/78   Pulse (!) 56   Ht 6' (1.829 m)   Wt 252 lb (114.3 kg)   SpO2 94%   BMI 34.18 kg/m   Physical Exam:  Well appearing middle age man, NAD HEENT: Unremarkable Neck:  6 cm JVD, no thyromegally Lymphatics:  No adenopathy Back:  No CVA tenderness Lungs:  Clear with no wheezes HEART:  Regular rate rhythm, no murmurs, no rubs, no clicks Abd:  soft, positive bowel sounds, no organomegally, no rebound, no guarding Ext:  2 plus pulses, no edema, no cyanosis, no clubbing Skin:  No rashes no nodules Neuro:  CN II through XII intact, motor grossly intact  EKG - NSR   Assess/Plan: 1. PAF - his CHADSVASC score is 2. He will continue his current meds. I have given a script for flecainide to be taken as a pill in the pocket. He will start Xarelto. He will continue the atenolol. If his symptoms worsen,  he will start taking flecainide twice daily. 2. HTN- his blood pressure is controlled with medical therapy. He will maintain a low sodium diet. 3. Sinus node dysfunction - he is asymptomatic.   Mikle Bosworth.D.

## 2016-05-13 NOTE — Patient Instructions (Addendum)
Medication Instructions:  Your physician has recommended you make the following change in your medication:  1) START Flecainide - Take up to 2 (100 mg) tablets daily as needed for breakthrough AFIB 3) STOP Aspirin 2) START Xarelto - Take 1 (20 mg ) tablet by mouth daily   Labwork: None Ordered   Testing/Procedures: None Ordered   Follow-Up: Your physician wants you to follow-up in: 6 months with Dr. Lovena Le. You will receive a reminder letter in the mail two months in advance. If you don't receive a letter, please call our office to schedule the follow-up appointment.   Any Other Special Instructions Will Be Listed Below (If Applicable).     If you need a refill on your cardiac medications before your next appointment, please call your pharmacy.

## 2016-06-20 DIAGNOSIS — M545 Low back pain: Secondary | ICD-10-CM | POA: Diagnosis not present

## 2016-06-20 DIAGNOSIS — M5136 Other intervertebral disc degeneration, lumbar region: Secondary | ICD-10-CM | POA: Diagnosis not present

## 2016-06-20 DIAGNOSIS — M9903 Segmental and somatic dysfunction of lumbar region: Secondary | ICD-10-CM | POA: Diagnosis not present

## 2016-06-20 DIAGNOSIS — M9901 Segmental and somatic dysfunction of cervical region: Secondary | ICD-10-CM | POA: Diagnosis not present

## 2016-06-23 DIAGNOSIS — M9903 Segmental and somatic dysfunction of lumbar region: Secondary | ICD-10-CM | POA: Diagnosis not present

## 2016-06-23 DIAGNOSIS — M5136 Other intervertebral disc degeneration, lumbar region: Secondary | ICD-10-CM | POA: Diagnosis not present

## 2016-06-23 DIAGNOSIS — M9901 Segmental and somatic dysfunction of cervical region: Secondary | ICD-10-CM | POA: Diagnosis not present

## 2016-06-23 DIAGNOSIS — M545 Low back pain: Secondary | ICD-10-CM | POA: Diagnosis not present

## 2016-06-25 DIAGNOSIS — M9903 Segmental and somatic dysfunction of lumbar region: Secondary | ICD-10-CM | POA: Diagnosis not present

## 2016-06-25 DIAGNOSIS — M9901 Segmental and somatic dysfunction of cervical region: Secondary | ICD-10-CM | POA: Diagnosis not present

## 2016-06-25 DIAGNOSIS — M5136 Other intervertebral disc degeneration, lumbar region: Secondary | ICD-10-CM | POA: Diagnosis not present

## 2016-06-25 DIAGNOSIS — M545 Low back pain: Secondary | ICD-10-CM | POA: Diagnosis not present

## 2016-06-27 DIAGNOSIS — M9903 Segmental and somatic dysfunction of lumbar region: Secondary | ICD-10-CM | POA: Diagnosis not present

## 2016-06-27 DIAGNOSIS — M5136 Other intervertebral disc degeneration, lumbar region: Secondary | ICD-10-CM | POA: Diagnosis not present

## 2016-06-27 DIAGNOSIS — M545 Low back pain: Secondary | ICD-10-CM | POA: Diagnosis not present

## 2016-06-27 DIAGNOSIS — M9901 Segmental and somatic dysfunction of cervical region: Secondary | ICD-10-CM | POA: Diagnosis not present

## 2016-07-03 DIAGNOSIS — M545 Low back pain: Secondary | ICD-10-CM | POA: Diagnosis not present

## 2016-07-03 DIAGNOSIS — M9903 Segmental and somatic dysfunction of lumbar region: Secondary | ICD-10-CM | POA: Diagnosis not present

## 2016-07-03 DIAGNOSIS — M9901 Segmental and somatic dysfunction of cervical region: Secondary | ICD-10-CM | POA: Diagnosis not present

## 2016-07-03 DIAGNOSIS — M5136 Other intervertebral disc degeneration, lumbar region: Secondary | ICD-10-CM | POA: Diagnosis not present

## 2016-07-04 DIAGNOSIS — M545 Low back pain: Secondary | ICD-10-CM | POA: Diagnosis not present

## 2016-07-04 DIAGNOSIS — M9903 Segmental and somatic dysfunction of lumbar region: Secondary | ICD-10-CM | POA: Diagnosis not present

## 2016-07-04 DIAGNOSIS — M5136 Other intervertebral disc degeneration, lumbar region: Secondary | ICD-10-CM | POA: Diagnosis not present

## 2016-07-04 DIAGNOSIS — M9901 Segmental and somatic dysfunction of cervical region: Secondary | ICD-10-CM | POA: Diagnosis not present

## 2016-07-07 DIAGNOSIS — M9903 Segmental and somatic dysfunction of lumbar region: Secondary | ICD-10-CM | POA: Diagnosis not present

## 2016-07-07 DIAGNOSIS — M9901 Segmental and somatic dysfunction of cervical region: Secondary | ICD-10-CM | POA: Diagnosis not present

## 2016-07-07 DIAGNOSIS — M5136 Other intervertebral disc degeneration, lumbar region: Secondary | ICD-10-CM | POA: Diagnosis not present

## 2016-07-07 DIAGNOSIS — M545 Low back pain: Secondary | ICD-10-CM | POA: Diagnosis not present

## 2016-07-09 DIAGNOSIS — M5136 Other intervertebral disc degeneration, lumbar region: Secondary | ICD-10-CM | POA: Diagnosis not present

## 2016-07-09 DIAGNOSIS — M545 Low back pain: Secondary | ICD-10-CM | POA: Diagnosis not present

## 2016-07-09 DIAGNOSIS — M9903 Segmental and somatic dysfunction of lumbar region: Secondary | ICD-10-CM | POA: Diagnosis not present

## 2016-07-09 DIAGNOSIS — M9901 Segmental and somatic dysfunction of cervical region: Secondary | ICD-10-CM | POA: Diagnosis not present

## 2016-07-18 ENCOUNTER — Other Ambulatory Visit: Payer: Self-pay | Admitting: Family Medicine

## 2016-07-24 ENCOUNTER — Telehealth: Payer: Self-pay | Admitting: Family Medicine

## 2016-07-24 MED ORDER — LISINOPRIL 5 MG PO TABS: 5.0000 mg | ORAL_TABLET | Freq: Every day | ORAL | 0 refills | Status: DC

## 2016-07-24 MED ORDER — ATENOLOL 25 MG PO TABS
25.0000 mg | ORAL_TABLET | Freq: Two times a day (BID) | ORAL | 0 refills | Status: DC
Start: 2016-07-24 — End: 2016-08-15

## 2016-07-24 NOTE — Telephone Encounter (Signed)
I sent both scripts e-scribe to [facility].

## 2016-07-24 NOTE — Telephone Encounter (Signed)
° ° °  Pt request refill of the following:  atenolol (TENORMIN) 25 MG tablet  lisinopril (PRINIVIL,ZESTRIL) 5 MG tablet  Pt has a physical scheduled for 08/15/16    Phamacy: [facility]

## 2016-07-25 ENCOUNTER — Other Ambulatory Visit: Payer: Self-pay | Admitting: Family Medicine

## 2016-08-15 ENCOUNTER — Encounter: Payer: Self-pay | Admitting: Family Medicine

## 2016-08-15 ENCOUNTER — Ambulatory Visit (INDEPENDENT_AMBULATORY_CARE_PROVIDER_SITE_OTHER): Payer: Medicare Other | Admitting: Family Medicine

## 2016-08-15 VITALS — BP 135/88 | HR 60 | Temp 98.2°F | Ht 72.0 in | Wt 246.0 lb

## 2016-08-15 DIAGNOSIS — E782 Mixed hyperlipidemia: Secondary | ICD-10-CM | POA: Diagnosis not present

## 2016-08-15 DIAGNOSIS — I10 Essential (primary) hypertension: Secondary | ICD-10-CM

## 2016-08-15 DIAGNOSIS — K219 Gastro-esophageal reflux disease without esophagitis: Secondary | ICD-10-CM | POA: Diagnosis not present

## 2016-08-15 DIAGNOSIS — N401 Enlarged prostate with lower urinary tract symptoms: Secondary | ICD-10-CM

## 2016-08-15 DIAGNOSIS — N138 Other obstructive and reflux uropathy: Secondary | ICD-10-CM | POA: Diagnosis not present

## 2016-08-15 DIAGNOSIS — E119 Type 2 diabetes mellitus without complications: Secondary | ICD-10-CM

## 2016-08-15 DIAGNOSIS — F528 Other sexual dysfunction not due to a substance or known physiological condition: Secondary | ICD-10-CM | POA: Diagnosis not present

## 2016-08-15 DIAGNOSIS — I480 Paroxysmal atrial fibrillation: Secondary | ICD-10-CM

## 2016-08-15 LAB — CBC WITH DIFFERENTIAL/PLATELET
BASOS ABS: 0.1 10*3/uL (ref 0.0–0.1)
BASOS PCT: 1 % (ref 0.0–3.0)
Eosinophils Absolute: 0.4 10*3/uL (ref 0.0–0.7)
Eosinophils Relative: 5.2 % — ABNORMAL HIGH (ref 0.0–5.0)
HCT: 45.8 % (ref 39.0–52.0)
Hemoglobin: 15.3 g/dL (ref 13.0–17.0)
LYMPHS ABS: 1.6 10*3/uL (ref 0.7–4.0)
Lymphocytes Relative: 21.5 % (ref 12.0–46.0)
MCHC: 33.4 g/dL (ref 30.0–36.0)
MCV: 87.4 fl (ref 78.0–100.0)
MONOS PCT: 6.8 % (ref 3.0–12.0)
Monocytes Absolute: 0.5 10*3/uL (ref 0.1–1.0)
NEUTROS ABS: 4.8 10*3/uL (ref 1.4–7.7)
NEUTROS PCT: 65.5 % (ref 43.0–77.0)
PLATELETS: 199 10*3/uL (ref 150.0–400.0)
RBC: 5.24 Mil/uL (ref 4.22–5.81)
RDW: 14 % (ref 11.5–15.5)
WBC: 7.3 10*3/uL (ref 4.0–10.5)

## 2016-08-15 LAB — HEMOGLOBIN A1C: HEMOGLOBIN A1C: 7.2 % — AB (ref 4.6–6.5)

## 2016-08-15 LAB — BASIC METABOLIC PANEL
BUN: 17 mg/dL (ref 6–23)
CHLORIDE: 102 meq/L (ref 96–112)
CO2: 32 meq/L (ref 19–32)
CREATININE: 1.09 mg/dL (ref 0.40–1.50)
Calcium: 9.1 mg/dL (ref 8.4–10.5)
GFR: 72.12 mL/min (ref >60.00)
Glucose, Bld: 137 mg/dL — ABNORMAL HIGH (ref 70–99)
Potassium: 4.5 mEq/L (ref 3.5–5.1)
Sodium: 139 mEq/L (ref 135–145)

## 2016-08-15 LAB — POC URINALSYSI DIPSTICK (AUTOMATED)
BILIRUBIN UA: NEGATIVE
Blood, UA: NEGATIVE
Glucose, UA: NEGATIVE
KETONES UA: NEGATIVE
LEUKOCYTES UA: NEGATIVE
Nitrite, UA: NEGATIVE
Protein, UA: NEGATIVE
Spec Grav, UA: 1.02 (ref 1.010–1.025)
Urobilinogen, UA: 2 E.U./dL — AB
pH, UA: 6 (ref 5.0–8.0)

## 2016-08-15 LAB — HEPATIC FUNCTION PANEL
ALBUMIN: 4 g/dL (ref 3.5–5.2)
ALT: 28 U/L (ref 0–53)
AST: 24 U/L (ref 0–37)
Alkaline Phosphatase: 84 U/L (ref 39–117)
BILIRUBIN TOTAL: 1 mg/dL (ref 0.2–1.2)
Bilirubin, Direct: 0.2 mg/dL (ref 0.0–0.3)
Total Protein: 6.5 g/dL (ref 6.0–8.3)

## 2016-08-15 LAB — LIPID PANEL
Cholesterol: 166 mg/dL (ref 0–200)
HDL: 42.2 mg/dL (ref >39.00)
LDL Cholesterol: 91 mg/dL (ref 0–99)
NonHDL: 123.98
TRIGLYCERIDES: 165 mg/dL — AB (ref 0.0–149.0)
Total CHOL/HDL Ratio: 4
VLDL: 33 mg/dL (ref 0.0–40.0)

## 2016-08-15 LAB — TSH: TSH: 2.46 u[IU]/mL (ref 0.35–4.50)

## 2016-08-15 LAB — PSA: PSA: 2.12 ng/mL (ref 0.10–4.00)

## 2016-08-15 MED ORDER — ATENOLOL 25 MG PO TABS: 25.0000 mg | ORAL_TABLET | Freq: Two times a day (BID) | ORAL | 3 refills | Status: DC

## 2016-08-15 MED ORDER — ATORVASTATIN CALCIUM 40 MG PO TABS: 40.0000 mg | ORAL_TABLET | Freq: Every day | ORAL | 3 refills | Status: DC

## 2016-08-15 MED ORDER — LISINOPRIL 5 MG PO TABS: 5.0000 mg | ORAL_TABLET | Freq: Every day | ORAL | 3 refills | Status: DC

## 2016-08-15 MED ORDER — ASPIRIN 81 MG PO TABS: 81.0000 mg | ORAL_TABLET | Freq: Every day | ORAL | 0 refills | Status: DC

## 2016-08-15 MED ORDER — SILDENAFIL CITRATE 20 MG PO TABS: ORAL_TABLET | ORAL | 11 refills | Status: DC

## 2016-08-15 MED ORDER — VENLAFAXINE HCL ER 75 MG PO CP24: 75.0000 mg | ORAL_CAPSULE | Freq: Every day | ORAL | 3 refills | Status: DC

## 2016-08-15 NOTE — Patient Instructions (Signed)
WE NOW OFFER   [name] [name]'s FAST TRACK!!!  SAME DAY Appointments for ACUTE CARE  Such as: Sprains, Injuries, cuts, abrasions, rashes, muscle pain, joint pain, back pain Colds, flu, sore throats, headache, allergies, cough, fever  Ear pain, sinus and eye infections Abdominal pain, nausea, vomiting, diarrhea, upset stomach Animal/insect bites  3 Easy Ways to Schedule: Walk-In Scheduling Call in scheduling Mychart Sign-up: [email or web address]         

## 2016-08-15 NOTE — Progress Notes (Signed)
   Subjective:    Patient ID: [name] [name], male    DOB: 05-08-1950, 66 y.o.   MRN: [id number]  HPI Here to follow up on issues. He has felt fine and has no concerns. He had not felt a bout of atrial fibrillation for several years until he felt an episode several months ago that last ed 12 hours. He saw Dr. Lovena Le who suggested he start on Xarelto. However [name] [name] has not started this yet because he is worried about side effects. He asks my advice. Otherwise he would to try generic Viagra for ED. His anxiety is stable and he asks if the dose of the Effexor can be reduced.    Review of Systems  Constitutional: Negative.   HENT: Negative.   Eyes: Negative.   Respiratory: Negative.   Cardiovascular: Negative.   Gastrointestinal: Negative.   Genitourinary: Negative.   Musculoskeletal: Negative.   Skin: Negative.   Neurological: Negative.   Psychiatric/Behavioral: Negative.        Objective:   Physical Exam  Constitutional: He is oriented to person, place, and time. He appears well-developed and well-nourished. No distress.  HENT:  Head: Normocephalic and atraumatic.  Right Ear: External ear normal.  Left Ear: External ear normal.  Nose: Nose normal.  Mouth/Throat: Oropharynx is clear and moist. No oropharyngeal exudate.  Eyes: Conjunctivae and EOM are normal. Pupils are equal, round, and reactive to light. Right eye exhibits no discharge. Left eye exhibits no discharge. No scleral icterus.  Neck: Neck supple. No JVD present. No tracheal deviation present. No thyromegaly present.  Cardiovascular: Normal rate, regular rhythm, normal heart sounds and intact distal pulses.  Exam reveals no gallop and no friction rub.   No murmur heard. Pulmonary/Chest: Effort normal and breath sounds normal. No respiratory distress. He has no wheezes. He has no rales. He exhibits no tenderness.  Abdominal: Soft. Bowel sounds are normal. He exhibits no distension and no mass. There is no tenderness. There  is no rebound and no guarding.  Genitourinary: Rectum normal, prostate normal and penis normal. Rectal exam shows guaiac negative stool. No penile tenderness.  Musculoskeletal: Normal range of motion. He exhibits no edema or tenderness.  Lymphadenopathy:    He has no cervical adenopathy.  Neurological: He is alert and oriented to person, place, and time. He has normal reflexes. No cranial nerve deficit. He exhibits normal muscle tone. Coordination normal.  Skin: Skin is warm and dry. No rash noted. He is not diaphoretic. No erythema. No pallor.  Psychiatric: He has a normal mood and affect. His behavior is normal. Judgment and thought content normal.          Assessment & Plan:  His atrial fibrillation seems to be quite infrequent so we agreed that he will not take Xarelto at this time. He will take aspirin 81 mg daily instead. His HTN is stable. Try Sildenafil for ED. Is anxiety is stable so decrease the Effexor XR to 75 mg daily. Get fasting labs to check diabetes, lipids, etc.  [name] Penna, MD

## 2016-09-15 DIAGNOSIS — M5136 Other intervertebral disc degeneration, lumbar region: Secondary | ICD-10-CM | POA: Diagnosis not present

## 2016-09-15 DIAGNOSIS — M9903 Segmental and somatic dysfunction of lumbar region: Secondary | ICD-10-CM | POA: Diagnosis not present

## 2016-09-15 DIAGNOSIS — M545 Low back pain: Secondary | ICD-10-CM | POA: Diagnosis not present

## 2016-09-15 DIAGNOSIS — M9901 Segmental and somatic dysfunction of cervical region: Secondary | ICD-10-CM | POA: Diagnosis not present

## 2016-09-16 DIAGNOSIS — M9903 Segmental and somatic dysfunction of lumbar region: Secondary | ICD-10-CM | POA: Diagnosis not present

## 2016-09-16 DIAGNOSIS — M9901 Segmental and somatic dysfunction of cervical region: Secondary | ICD-10-CM | POA: Diagnosis not present

## 2016-09-16 DIAGNOSIS — M545 Low back pain: Secondary | ICD-10-CM | POA: Diagnosis not present

## 2016-09-16 DIAGNOSIS — M5136 Other intervertebral disc degeneration, lumbar region: Secondary | ICD-10-CM | POA: Diagnosis not present

## 2016-09-18 DIAGNOSIS — M9903 Segmental and somatic dysfunction of lumbar region: Secondary | ICD-10-CM | POA: Diagnosis not present

## 2016-09-18 DIAGNOSIS — M9901 Segmental and somatic dysfunction of cervical region: Secondary | ICD-10-CM | POA: Diagnosis not present

## 2016-09-18 DIAGNOSIS — M5136 Other intervertebral disc degeneration, lumbar region: Secondary | ICD-10-CM | POA: Diagnosis not present

## 2016-09-18 DIAGNOSIS — M545 Low back pain: Secondary | ICD-10-CM | POA: Diagnosis not present

## 2016-09-23 DIAGNOSIS — M9901 Segmental and somatic dysfunction of cervical region: Secondary | ICD-10-CM | POA: Diagnosis not present

## 2016-09-23 DIAGNOSIS — M5136 Other intervertebral disc degeneration, lumbar region: Secondary | ICD-10-CM | POA: Diagnosis not present

## 2016-09-23 DIAGNOSIS — M9903 Segmental and somatic dysfunction of lumbar region: Secondary | ICD-10-CM | POA: Diagnosis not present

## 2016-09-23 DIAGNOSIS — M545 Low back pain: Secondary | ICD-10-CM | POA: Diagnosis not present

## 2016-09-25 DIAGNOSIS — M9901 Segmental and somatic dysfunction of cervical region: Secondary | ICD-10-CM | POA: Diagnosis not present

## 2016-09-25 DIAGNOSIS — M5136 Other intervertebral disc degeneration, lumbar region: Secondary | ICD-10-CM | POA: Diagnosis not present

## 2016-09-25 DIAGNOSIS — M9903 Segmental and somatic dysfunction of lumbar region: Secondary | ICD-10-CM | POA: Diagnosis not present

## 2016-09-25 DIAGNOSIS — M545 Low back pain: Secondary | ICD-10-CM | POA: Diagnosis not present

## 2016-09-29 DIAGNOSIS — M9901 Segmental and somatic dysfunction of cervical region: Secondary | ICD-10-CM | POA: Diagnosis not present

## 2016-09-29 DIAGNOSIS — M9903 Segmental and somatic dysfunction of lumbar region: Secondary | ICD-10-CM | POA: Diagnosis not present

## 2016-09-29 DIAGNOSIS — M5136 Other intervertebral disc degeneration, lumbar region: Secondary | ICD-10-CM | POA: Diagnosis not present

## 2016-09-29 DIAGNOSIS — M545 Low back pain: Secondary | ICD-10-CM | POA: Diagnosis not present

## 2016-09-30 DIAGNOSIS — M545 Low back pain: Secondary | ICD-10-CM | POA: Diagnosis not present

## 2016-09-30 DIAGNOSIS — M9903 Segmental and somatic dysfunction of lumbar region: Secondary | ICD-10-CM | POA: Diagnosis not present

## 2016-09-30 DIAGNOSIS — M5136 Other intervertebral disc degeneration, lumbar region: Secondary | ICD-10-CM | POA: Diagnosis not present

## 2016-09-30 DIAGNOSIS — M9901 Segmental and somatic dysfunction of cervical region: Secondary | ICD-10-CM | POA: Diagnosis not present

## 2016-10-01 DIAGNOSIS — M9903 Segmental and somatic dysfunction of lumbar region: Secondary | ICD-10-CM | POA: Diagnosis not present

## 2016-10-01 DIAGNOSIS — M5136 Other intervertebral disc degeneration, lumbar region: Secondary | ICD-10-CM | POA: Diagnosis not present

## 2016-10-01 DIAGNOSIS — M9901 Segmental and somatic dysfunction of cervical region: Secondary | ICD-10-CM | POA: Diagnosis not present

## 2016-10-01 DIAGNOSIS — M545 Low back pain: Secondary | ICD-10-CM | POA: Diagnosis not present

## 2016-10-06 DIAGNOSIS — M545 Low back pain: Secondary | ICD-10-CM | POA: Diagnosis not present

## 2016-10-06 DIAGNOSIS — M5136 Other intervertebral disc degeneration, lumbar region: Secondary | ICD-10-CM | POA: Diagnosis not present

## 2016-10-06 DIAGNOSIS — M9903 Segmental and somatic dysfunction of lumbar region: Secondary | ICD-10-CM | POA: Diagnosis not present

## 2016-10-06 DIAGNOSIS — M9901 Segmental and somatic dysfunction of cervical region: Secondary | ICD-10-CM | POA: Diagnosis not present

## 2016-10-08 DIAGNOSIS — M9903 Segmental and somatic dysfunction of lumbar region: Secondary | ICD-10-CM | POA: Diagnosis not present

## 2016-10-08 DIAGNOSIS — M545 Low back pain: Secondary | ICD-10-CM | POA: Diagnosis not present

## 2016-10-08 DIAGNOSIS — M9901 Segmental and somatic dysfunction of cervical region: Secondary | ICD-10-CM | POA: Diagnosis not present

## 2016-10-08 DIAGNOSIS — M5136 Other intervertebral disc degeneration, lumbar region: Secondary | ICD-10-CM | POA: Diagnosis not present

## 2016-10-13 DIAGNOSIS — M545 Low back pain: Secondary | ICD-10-CM | POA: Diagnosis not present

## 2016-10-13 DIAGNOSIS — M9901 Segmental and somatic dysfunction of cervical region: Secondary | ICD-10-CM | POA: Diagnosis not present

## 2016-10-13 DIAGNOSIS — M9903 Segmental and somatic dysfunction of lumbar region: Secondary | ICD-10-CM | POA: Diagnosis not present

## 2016-10-13 DIAGNOSIS — M5136 Other intervertebral disc degeneration, lumbar region: Secondary | ICD-10-CM | POA: Diagnosis not present

## 2016-10-15 DIAGNOSIS — M9901 Segmental and somatic dysfunction of cervical region: Secondary | ICD-10-CM | POA: Diagnosis not present

## 2016-10-15 DIAGNOSIS — M545 Low back pain: Secondary | ICD-10-CM | POA: Diagnosis not present

## 2016-10-15 DIAGNOSIS — M9903 Segmental and somatic dysfunction of lumbar region: Secondary | ICD-10-CM | POA: Diagnosis not present

## 2016-10-15 DIAGNOSIS — M5136 Other intervertebral disc degeneration, lumbar region: Secondary | ICD-10-CM | POA: Diagnosis not present

## 2016-10-23 DIAGNOSIS — H5319 Other subjective visual disturbances: Secondary | ICD-10-CM | POA: Diagnosis not present

## 2016-10-23 DIAGNOSIS — H35413 Lattice degeneration of retina, bilateral: Secondary | ICD-10-CM | POA: Diagnosis not present

## 2016-10-23 DIAGNOSIS — H35371 Puckering of macula, right eye: Secondary | ICD-10-CM | POA: Diagnosis not present

## 2016-10-23 DIAGNOSIS — H43812 Vitreous degeneration, left eye: Secondary | ICD-10-CM | POA: Diagnosis not present

## 2016-10-23 DIAGNOSIS — H31091 Other chorioretinal scars, right eye: Secondary | ICD-10-CM | POA: Diagnosis not present

## 2016-10-23 DIAGNOSIS — H43811 Vitreous degeneration, right eye: Secondary | ICD-10-CM | POA: Diagnosis not present

## 2016-10-30 DIAGNOSIS — H43812 Vitreous degeneration, left eye: Secondary | ICD-10-CM | POA: Diagnosis not present

## 2016-11-16 DIAGNOSIS — J0110 Acute frontal sinusitis, unspecified: Secondary | ICD-10-CM | POA: Diagnosis not present

## 2016-11-19 DIAGNOSIS — H43812 Vitreous degeneration, left eye: Secondary | ICD-10-CM | POA: Diagnosis not present

## 2016-11-19 DIAGNOSIS — H5319 Other subjective visual disturbances: Secondary | ICD-10-CM | POA: Diagnosis not present

## 2017-02-18 DIAGNOSIS — M5136 Other intervertebral disc degeneration, lumbar region: Secondary | ICD-10-CM | POA: Diagnosis not present

## 2017-02-18 DIAGNOSIS — M545 Low back pain: Secondary | ICD-10-CM | POA: Diagnosis not present

## 2017-02-18 DIAGNOSIS — M9903 Segmental and somatic dysfunction of lumbar region: Secondary | ICD-10-CM | POA: Diagnosis not present

## 2017-02-18 DIAGNOSIS — M9901 Segmental and somatic dysfunction of cervical region: Secondary | ICD-10-CM | POA: Diagnosis not present

## 2017-02-23 DIAGNOSIS — M5136 Other intervertebral disc degeneration, lumbar region: Secondary | ICD-10-CM | POA: Diagnosis not present

## 2017-02-23 DIAGNOSIS — M9901 Segmental and somatic dysfunction of cervical region: Secondary | ICD-10-CM | POA: Diagnosis not present

## 2017-02-23 DIAGNOSIS — M545 Low back pain: Secondary | ICD-10-CM | POA: Diagnosis not present

## 2017-02-23 DIAGNOSIS — M9903 Segmental and somatic dysfunction of lumbar region: Secondary | ICD-10-CM | POA: Diagnosis not present

## 2017-02-26 DIAGNOSIS — M5136 Other intervertebral disc degeneration, lumbar region: Secondary | ICD-10-CM | POA: Diagnosis not present

## 2017-02-26 DIAGNOSIS — M545 Low back pain: Secondary | ICD-10-CM | POA: Diagnosis not present

## 2017-02-26 DIAGNOSIS — M9903 Segmental and somatic dysfunction of lumbar region: Secondary | ICD-10-CM | POA: Diagnosis not present

## 2017-02-26 DIAGNOSIS — M9901 Segmental and somatic dysfunction of cervical region: Secondary | ICD-10-CM | POA: Diagnosis not present

## 2017-03-06 DIAGNOSIS — M545 Low back pain: Secondary | ICD-10-CM | POA: Diagnosis not present

## 2017-03-06 DIAGNOSIS — M9903 Segmental and somatic dysfunction of lumbar region: Secondary | ICD-10-CM | POA: Diagnosis not present

## 2017-03-06 DIAGNOSIS — M5136 Other intervertebral disc degeneration, lumbar region: Secondary | ICD-10-CM | POA: Diagnosis not present

## 2017-03-06 DIAGNOSIS — M9901 Segmental and somatic dysfunction of cervical region: Secondary | ICD-10-CM | POA: Diagnosis not present

## 2017-04-22 ENCOUNTER — Telehealth: Payer: Self-pay

## 2017-04-22 NOTE — Telephone Encounter (Signed)
Fax from pharmacy [facility] pt had his Rx transferred to another pharmacy and now he is wanting to back back to this pharmacy they will need a new Rx for Sildenafil 20 MG   Last refilled 08/15/2016 disp 50 with 11 refills

## 2017-06-08 ENCOUNTER — Encounter: Payer: Self-pay | Admitting: Gastroenterology

## 2017-06-12 DIAGNOSIS — M9901 Segmental and somatic dysfunction of cervical region: Secondary | ICD-10-CM | POA: Diagnosis not present

## 2017-06-12 DIAGNOSIS — M50322 Other cervical disc degeneration at C5-C6 level: Secondary | ICD-10-CM | POA: Diagnosis not present

## 2017-06-12 DIAGNOSIS — M9903 Segmental and somatic dysfunction of lumbar region: Secondary | ICD-10-CM | POA: Diagnosis not present

## 2017-06-12 DIAGNOSIS — M545 Low back pain: Secondary | ICD-10-CM | POA: Diagnosis not present

## 2017-06-16 DIAGNOSIS — M9901 Segmental and somatic dysfunction of cervical region: Secondary | ICD-10-CM | POA: Diagnosis not present

## 2017-06-16 DIAGNOSIS — M545 Low back pain: Secondary | ICD-10-CM | POA: Diagnosis not present

## 2017-06-16 DIAGNOSIS — M50322 Other cervical disc degeneration at C5-C6 level: Secondary | ICD-10-CM | POA: Diagnosis not present

## 2017-06-16 DIAGNOSIS — M9903 Segmental and somatic dysfunction of lumbar region: Secondary | ICD-10-CM | POA: Diagnosis not present

## 2017-06-18 DIAGNOSIS — M9903 Segmental and somatic dysfunction of lumbar region: Secondary | ICD-10-CM | POA: Diagnosis not present

## 2017-06-18 DIAGNOSIS — M545 Low back pain: Secondary | ICD-10-CM | POA: Diagnosis not present

## 2017-06-18 DIAGNOSIS — M9901 Segmental and somatic dysfunction of cervical region: Secondary | ICD-10-CM | POA: Diagnosis not present

## 2017-06-18 DIAGNOSIS — M50322 Other cervical disc degeneration at C5-C6 level: Secondary | ICD-10-CM | POA: Diagnosis not present

## 2017-06-23 DIAGNOSIS — M50322 Other cervical disc degeneration at C5-C6 level: Secondary | ICD-10-CM | POA: Diagnosis not present

## 2017-06-23 DIAGNOSIS — M545 Low back pain: Secondary | ICD-10-CM | POA: Diagnosis not present

## 2017-06-23 DIAGNOSIS — M9901 Segmental and somatic dysfunction of cervical region: Secondary | ICD-10-CM | POA: Diagnosis not present

## 2017-06-23 DIAGNOSIS — M9903 Segmental and somatic dysfunction of lumbar region: Secondary | ICD-10-CM | POA: Diagnosis not present

## 2017-06-25 DIAGNOSIS — M50322 Other cervical disc degeneration at C5-C6 level: Secondary | ICD-10-CM | POA: Diagnosis not present

## 2017-06-25 DIAGNOSIS — M9901 Segmental and somatic dysfunction of cervical region: Secondary | ICD-10-CM | POA: Diagnosis not present

## 2017-06-25 DIAGNOSIS — M545 Low back pain: Secondary | ICD-10-CM | POA: Diagnosis not present

## 2017-06-25 DIAGNOSIS — M9903 Segmental and somatic dysfunction of lumbar region: Secondary | ICD-10-CM | POA: Diagnosis not present

## 2017-06-30 DIAGNOSIS — M545 Low back pain: Secondary | ICD-10-CM | POA: Diagnosis not present

## 2017-06-30 DIAGNOSIS — M50322 Other cervical disc degeneration at C5-C6 level: Secondary | ICD-10-CM | POA: Diagnosis not present

## 2017-06-30 DIAGNOSIS — M9903 Segmental and somatic dysfunction of lumbar region: Secondary | ICD-10-CM | POA: Diagnosis not present

## 2017-06-30 DIAGNOSIS — M9901 Segmental and somatic dysfunction of cervical region: Secondary | ICD-10-CM | POA: Diagnosis not present

## 2017-07-03 DIAGNOSIS — M50322 Other cervical disc degeneration at C5-C6 level: Secondary | ICD-10-CM | POA: Diagnosis not present

## 2017-07-03 DIAGNOSIS — M545 Low back pain: Secondary | ICD-10-CM | POA: Diagnosis not present

## 2017-07-03 DIAGNOSIS — M9903 Segmental and somatic dysfunction of lumbar region: Secondary | ICD-10-CM | POA: Diagnosis not present

## 2017-07-03 DIAGNOSIS — M9901 Segmental and somatic dysfunction of cervical region: Secondary | ICD-10-CM | POA: Diagnosis not present

## 2017-07-14 DIAGNOSIS — M9903 Segmental and somatic dysfunction of lumbar region: Secondary | ICD-10-CM | POA: Diagnosis not present

## 2017-07-14 DIAGNOSIS — M50322 Other cervical disc degeneration at C5-C6 level: Secondary | ICD-10-CM | POA: Diagnosis not present

## 2017-07-14 DIAGNOSIS — M9901 Segmental and somatic dysfunction of cervical region: Secondary | ICD-10-CM | POA: Diagnosis not present

## 2017-07-14 DIAGNOSIS — M545 Low back pain: Secondary | ICD-10-CM | POA: Diagnosis not present

## 2017-07-15 ENCOUNTER — Other Ambulatory Visit: Payer: Self-pay | Admitting: Family Medicine

## 2017-07-15 MED ORDER — VENLAFAXINE HCL ER 75 MG PO CP24: 75.0000 mg | ORAL_CAPSULE | Freq: Every day | ORAL | 0 refills | Status: DC

## 2017-07-15 MED ORDER — ATENOLOL 25 MG PO TABS: 25.0000 mg | ORAL_TABLET | Freq: Two times a day (BID) | ORAL | 0 refills | Status: DC

## 2017-07-15 MED ORDER — LISINOPRIL 5 MG PO TABS: 5.0000 mg | ORAL_TABLET | Freq: Every day | ORAL | 0 refills | Status: DC

## 2017-07-15 MED ORDER — ATORVASTATIN CALCIUM 40 MG PO TABS: 40.0000 mg | ORAL_TABLET | Freq: Every day | ORAL | 0 refills | Status: DC

## 2017-07-15 NOTE — Telephone Encounter (Signed)
Copied from [place] [phone]. Topic: Quick Communication - Rx Refill/Question >> Jul 15, 2017 11:04 AM Robina Ade, Helene Kelp D wrote: Medication: atenolol (TENORMIN) 25 MG tablet,lisinopril (PRINIVIL,ZESTRIL) 5 MG tablet,venlafaxine XR (EFFEXOR XR) 75 MG 24 hr capsule,atorvastatin (LIPITOR) 40 MG tablet Has the patient contacted their pharmacy? Yes, pt has an appt and needs refill to last him until his appt in June. (Agent: If no, request that the patient contact the pharmacy for the refill.) Preferred Pharmacy (with phone number or street name): [facility] [address] [place], [place] [address] Agent: Please be advised that RX refills may take up to 3 business days. We ask that you follow-up with your pharmacy.

## 2017-07-15 NOTE — Telephone Encounter (Signed)
Pt given refills of medication until seen for appt on 08/19/17

## 2017-07-17 DIAGNOSIS — M9903 Segmental and somatic dysfunction of lumbar region: Secondary | ICD-10-CM | POA: Diagnosis not present

## 2017-07-17 DIAGNOSIS — M50322 Other cervical disc degeneration at C5-C6 level: Secondary | ICD-10-CM | POA: Diagnosis not present

## 2017-07-17 DIAGNOSIS — M9901 Segmental and somatic dysfunction of cervical region: Secondary | ICD-10-CM | POA: Diagnosis not present

## 2017-07-17 DIAGNOSIS — M545 Low back pain: Secondary | ICD-10-CM | POA: Diagnosis not present

## 2017-07-21 DIAGNOSIS — M9903 Segmental and somatic dysfunction of lumbar region: Secondary | ICD-10-CM | POA: Diagnosis not present

## 2017-07-21 DIAGNOSIS — M545 Low back pain: Secondary | ICD-10-CM | POA: Diagnosis not present

## 2017-07-21 DIAGNOSIS — M50322 Other cervical disc degeneration at C5-C6 level: Secondary | ICD-10-CM | POA: Diagnosis not present

## 2017-07-21 DIAGNOSIS — M9901 Segmental and somatic dysfunction of cervical region: Secondary | ICD-10-CM | POA: Diagnosis not present

## 2017-07-24 DIAGNOSIS — M9903 Segmental and somatic dysfunction of lumbar region: Secondary | ICD-10-CM | POA: Diagnosis not present

## 2017-07-24 DIAGNOSIS — M545 Low back pain: Secondary | ICD-10-CM | POA: Diagnosis not present

## 2017-07-24 DIAGNOSIS — M50322 Other cervical disc degeneration at C5-C6 level: Secondary | ICD-10-CM | POA: Diagnosis not present

## 2017-07-24 DIAGNOSIS — M9901 Segmental and somatic dysfunction of cervical region: Secondary | ICD-10-CM | POA: Diagnosis not present

## 2017-07-28 DIAGNOSIS — M545 Low back pain: Secondary | ICD-10-CM | POA: Diagnosis not present

## 2017-07-28 DIAGNOSIS — M50322 Other cervical disc degeneration at C5-C6 level: Secondary | ICD-10-CM | POA: Diagnosis not present

## 2017-07-28 DIAGNOSIS — M9903 Segmental and somatic dysfunction of lumbar region: Secondary | ICD-10-CM | POA: Diagnosis not present

## 2017-07-28 DIAGNOSIS — M9901 Segmental and somatic dysfunction of cervical region: Secondary | ICD-10-CM | POA: Diagnosis not present

## 2017-07-31 DIAGNOSIS — M545 Low back pain: Secondary | ICD-10-CM | POA: Diagnosis not present

## 2017-07-31 DIAGNOSIS — M50322 Other cervical disc degeneration at C5-C6 level: Secondary | ICD-10-CM | POA: Diagnosis not present

## 2017-07-31 DIAGNOSIS — M9903 Segmental and somatic dysfunction of lumbar region: Secondary | ICD-10-CM | POA: Diagnosis not present

## 2017-07-31 DIAGNOSIS — M9901 Segmental and somatic dysfunction of cervical region: Secondary | ICD-10-CM | POA: Diagnosis not present

## 2017-08-04 DIAGNOSIS — M50322 Other cervical disc degeneration at C5-C6 level: Secondary | ICD-10-CM | POA: Diagnosis not present

## 2017-08-04 DIAGNOSIS — M545 Low back pain: Secondary | ICD-10-CM | POA: Diagnosis not present

## 2017-08-04 DIAGNOSIS — M9903 Segmental and somatic dysfunction of lumbar region: Secondary | ICD-10-CM | POA: Diagnosis not present

## 2017-08-04 DIAGNOSIS — M9901 Segmental and somatic dysfunction of cervical region: Secondary | ICD-10-CM | POA: Diagnosis not present

## 2017-08-18 ENCOUNTER — Other Ambulatory Visit: Payer: Self-pay | Admitting: Family Medicine

## 2017-08-19 ENCOUNTER — Ambulatory Visit (INDEPENDENT_AMBULATORY_CARE_PROVIDER_SITE_OTHER): Payer: Medicare Other | Admitting: Family Medicine

## 2017-08-19 ENCOUNTER — Encounter: Payer: Self-pay | Admitting: Family Medicine

## 2017-08-19 VITALS — BP 130/88 | HR 52 | Temp 97.6°F | Ht 72.0 in | Wt 240.6 lb

## 2017-08-19 DIAGNOSIS — K219 Gastro-esophageal reflux disease without esophagitis: Secondary | ICD-10-CM

## 2017-08-19 DIAGNOSIS — F411 Generalized anxiety disorder: Secondary | ICD-10-CM

## 2017-08-19 DIAGNOSIS — N138 Other obstructive and reflux uropathy: Secondary | ICD-10-CM

## 2017-08-19 DIAGNOSIS — N401 Enlarged prostate with lower urinary tract symptoms: Secondary | ICD-10-CM | POA: Diagnosis not present

## 2017-08-19 DIAGNOSIS — Z0000 Encounter for general adult medical examination without abnormal findings: Secondary | ICD-10-CM | POA: Diagnosis not present

## 2017-08-19 DIAGNOSIS — E782 Mixed hyperlipidemia: Secondary | ICD-10-CM

## 2017-08-19 DIAGNOSIS — I480 Paroxysmal atrial fibrillation: Secondary | ICD-10-CM | POA: Diagnosis not present

## 2017-08-19 DIAGNOSIS — I10 Essential (primary) hypertension: Secondary | ICD-10-CM | POA: Diagnosis not present

## 2017-08-19 DIAGNOSIS — E119 Type 2 diabetes mellitus without complications: Secondary | ICD-10-CM

## 2017-08-19 LAB — CBC WITH DIFFERENTIAL/PLATELET
Basophils Absolute: 0.1 10*3/uL (ref 0.0–0.1)
Basophils Relative: 1.2 % (ref 0.0–3.0)
EOS ABS: 0.4 10*3/uL (ref 0.0–0.7)
Eosinophils Relative: 5.6 % — ABNORMAL HIGH (ref 0.0–5.0)
HEMATOCRIT: 43.5 % (ref 39.0–52.0)
Hemoglobin: 14.9 g/dL (ref 13.0–17.0)
LYMPHS PCT: 22.5 % (ref 12.0–46.0)
Lymphs Abs: 1.5 10*3/uL (ref 0.7–4.0)
MCHC: 34.2 g/dL (ref 30.0–36.0)
MCV: 85.4 fl (ref 78.0–100.0)
MONOS PCT: 7.6 % (ref 3.0–12.0)
Monocytes Absolute: 0.5 10*3/uL (ref 0.1–1.0)
NEUTROS ABS: 4.3 10*3/uL (ref 1.4–7.7)
Neutrophils Relative %: 63.1 % (ref 43.0–77.0)
PLATELETS: 195 10*3/uL (ref 150.0–400.0)
RBC: 5.09 Mil/uL (ref 4.22–5.81)
RDW: 14.1 % (ref 11.5–15.5)
WBC: 6.9 10*3/uL (ref 4.0–10.5)

## 2017-08-19 LAB — BASIC METABOLIC PANEL
BUN: 15 mg/dL (ref 6–23)
CHLORIDE: 100 meq/L (ref 96–112)
CO2: 33 mEq/L — ABNORMAL HIGH (ref 19–32)
Calcium: 9.2 mg/dL (ref 8.4–10.5)
Creatinine, Ser: 1 mg/dL (ref 0.40–1.50)
GFR: 79.41 mL/min (ref >60.00)
GLUCOSE: 132 mg/dL — AB (ref 70–99)
POTASSIUM: 4.4 meq/L (ref 3.5–5.1)
Sodium: 138 mEq/L (ref 135–145)

## 2017-08-19 LAB — PSA: PSA: 1.7 ng/mL (ref 0.10–4.00)

## 2017-08-19 LAB — POC URINALSYSI DIPSTICK (AUTOMATED)
BILIRUBIN UA: NEGATIVE
GLUCOSE UA: NEGATIVE
Ketones, UA: NEGATIVE
Leukocytes, UA: NEGATIVE
NITRITE UA: NEGATIVE
Protein, UA: NEGATIVE
Spec Grav, UA: 1.02 (ref 1.010–1.025)
Urobilinogen, UA: 1 E.U./dL
pH, UA: 6.5 (ref 5.0–8.0)

## 2017-08-19 LAB — LIPID PANEL
Cholesterol: 159 mg/dL (ref 0–200)
HDL: 41.2 mg/dL (ref >39.00)
LDL CALC: 90 mg/dL (ref 0–99)
NONHDL: 117.56
Total CHOL/HDL Ratio: 4
Triglycerides: 136 mg/dL (ref 0.0–149.0)
VLDL: 27.2 mg/dL (ref 0.0–40.0)

## 2017-08-19 LAB — HEPATIC FUNCTION PANEL
ALBUMIN: 4.2 g/dL (ref 3.5–5.2)
ALK PHOS: 97 U/L (ref 39–117)
ALT: 17 U/L (ref 0–53)
AST: 16 U/L (ref 0–37)
Bilirubin, Direct: 0.2 mg/dL (ref 0.0–0.3)
TOTAL PROTEIN: 6.6 g/dL (ref 6.0–8.3)
Total Bilirubin: 1 mg/dL (ref 0.2–1.2)

## 2017-08-19 LAB — TSH: TSH: 3.26 u[IU]/mL (ref 0.35–4.50)

## 2017-08-19 LAB — HEMOGLOBIN A1C: Hgb A1c MFr Bld: 6.9 % — ABNORMAL HIGH (ref 4.6–6.5)

## 2017-08-19 MED ORDER — SILDENAFIL CITRATE 20 MG PO TABS: ORAL_TABLET | ORAL | 11 refills | Status: DC

## 2017-08-19 MED ORDER — VENLAFAXINE HCL ER 75 MG PO CP24: 75.0000 mg | ORAL_CAPSULE | Freq: Every day | ORAL | 3 refills | Status: DC

## 2017-08-19 MED ORDER — LISINOPRIL 5 MG PO TABS: 5.0000 mg | ORAL_TABLET | Freq: Every day | ORAL | 3 refills | Status: DC

## 2017-08-19 MED ORDER — ATENOLOL 25 MG PO TABS: 25.0000 mg | ORAL_TABLET | Freq: Two times a day (BID) | ORAL | 3 refills | Status: DC

## 2017-08-19 MED ORDER — ATORVASTATIN CALCIUM 40 MG PO TABS: 40.0000 mg | ORAL_TABLET | Freq: Every day | ORAL | 3 refills | Status: DC

## 2017-08-19 NOTE — Progress Notes (Signed)
   Subjective:    Patient ID: [name] [name], male    DOB: [birth year], 67 y.o.   MRN: [id number]  HPI Here to follow up on issues. He feels well. He still has an occasional bout of atrial fibrillation, but these are brief and infrequent. He still carries a supply of Flecainide with him to use prn, but he has never actually taken one. He is still working full time.    Review of Systems  Constitutional: Negative.   HENT: Negative.   Eyes: Negative.   Respiratory: Negative.   Cardiovascular: Negative.   Gastrointestinal: Negative.   Genitourinary: Negative.   Musculoskeletal: Negative.   Skin: Negative.   Neurological: Negative.   Psychiatric/Behavioral: Negative.        Objective:   Physical Exam  Constitutional: He is oriented to person, place, and time. He appears well-developed and well-nourished. No distress.  HENT:  Head: Normocephalic and atraumatic.  Right Ear: External ear normal.  Left Ear: External ear normal.  Nose: Nose normal.  Mouth/Throat: Oropharynx is clear and moist. No oropharyngeal exudate.  Eyes: Pupils are equal, round, and reactive to light. Conjunctivae and EOM are normal. Right eye exhibits no discharge. Left eye exhibits no discharge. No scleral icterus.  Neck: Neck supple. No JVD present. No tracheal deviation present. No thyromegaly present.  Cardiovascular: Normal rate, regular rhythm, normal heart sounds and intact distal pulses. Exam reveals no gallop and no friction rub.  No murmur heard. Pulmonary/Chest: Effort normal and breath sounds normal. No respiratory distress. He has no wheezes. He has no rales. He exhibits no tenderness.  Abdominal: Soft. Bowel sounds are normal. He exhibits no distension and no mass. There is no tenderness. There is no rebound and no guarding.  Genitourinary: Rectum normal, prostate normal and penis normal. Rectal exam shows guaiac negative stool. No penile tenderness.  Musculoskeletal: Normal range of motion. He exhibits  no edema or tenderness.  Lymphadenopathy:    He has no cervical adenopathy.  Neurological: He is alert and oriented to person, place, and time. He has normal reflexes. He displays normal reflexes. No cranial nerve deficit. He exhibits normal muscle tone. Coordination normal.  Skin: Skin is warm and dry. No rash noted. He is not diaphoretic. No erythema. No pallor.  Psychiatric: He has a normal mood and affect. His behavior is normal. Judgment and thought content normal.          Assessment & Plan:  His HTN is stable and the atrial fibrillation is well controlled. We will get fasting labs today to check lipids and an A1c. His anxiety is stable. He is due for another colonoscopy, so we will arrange this. Alysia Penna, MD

## 2017-08-20 ENCOUNTER — Telehealth: Payer: Self-pay | Admitting: Family Medicine

## 2017-08-20 NOTE — Telephone Encounter (Signed)
The patient dropped off a form for Cardiology assessment for dot certification form  Fax form to: [phone] ATTN: Letitia Libra MSN, FNP-C  Mail form to: Eran Mistry            [address]           [place], [place] [address] (AFTER FAXED)  Disposition: Dr's Folder

## 2017-08-24 NOTE — Telephone Encounter (Signed)
Form placed in Dr. Fry's RED folder  

## 2017-08-24 NOTE — Telephone Encounter (Signed)
The form is ready  

## 2017-08-26 NOTE — Telephone Encounter (Signed)
Form has been faxed and placed to be mailed to pt. This was done on 6/17.

## 2017-09-03 DIAGNOSIS — H43811 Vitreous degeneration, right eye: Secondary | ICD-10-CM | POA: Diagnosis not present

## 2017-09-03 DIAGNOSIS — H04123 Dry eye syndrome of bilateral lacrimal glands: Secondary | ICD-10-CM | POA: Diagnosis not present

## 2017-09-03 DIAGNOSIS — H35411 Lattice degeneration of retina, right eye: Secondary | ICD-10-CM | POA: Diagnosis not present

## 2017-09-03 DIAGNOSIS — H35371 Puckering of macula, right eye: Secondary | ICD-10-CM | POA: Diagnosis not present

## 2017-09-18 ENCOUNTER — Encounter: Payer: Self-pay | Admitting: Family Medicine

## 2017-10-02 DIAGNOSIS — M9903 Segmental and somatic dysfunction of lumbar region: Secondary | ICD-10-CM | POA: Diagnosis not present

## 2017-10-02 DIAGNOSIS — M9901 Segmental and somatic dysfunction of cervical region: Secondary | ICD-10-CM | POA: Diagnosis not present

## 2017-10-02 DIAGNOSIS — M50322 Other cervical disc degeneration at C5-C6 level: Secondary | ICD-10-CM | POA: Diagnosis not present

## 2017-10-02 DIAGNOSIS — M545 Low back pain: Secondary | ICD-10-CM | POA: Diagnosis not present

## 2017-10-06 DIAGNOSIS — M9903 Segmental and somatic dysfunction of lumbar region: Secondary | ICD-10-CM | POA: Diagnosis not present

## 2017-10-06 DIAGNOSIS — M9901 Segmental and somatic dysfunction of cervical region: Secondary | ICD-10-CM | POA: Diagnosis not present

## 2017-10-06 DIAGNOSIS — M50322 Other cervical disc degeneration at C5-C6 level: Secondary | ICD-10-CM | POA: Diagnosis not present

## 2017-10-06 DIAGNOSIS — M545 Low back pain: Secondary | ICD-10-CM | POA: Diagnosis not present

## 2017-10-30 ENCOUNTER — Encounter: Payer: Self-pay | Admitting: Gastroenterology

## 2017-11-23 ENCOUNTER — Ambulatory Visit: Payer: Self-pay | Admitting: Family Medicine

## 2017-11-23 NOTE — Telephone Encounter (Signed)
Pt has done A lot more than normal physical activity of digging in the yard and things and has seen some blood in urine for a couple of times and would like to talk with someone one about this

## 2017-11-23 NOTE — Telephone Encounter (Signed)
Attempted to call pt x 2 and message left for the pt to return call to the office to discuss current symptoms.

## 2017-11-24 ENCOUNTER — Encounter: Payer: Self-pay | Admitting: Internal Medicine

## 2017-11-24 ENCOUNTER — Observation Stay
Admission: EM | Admit: 2017-11-24 | Discharge: 2017-11-24 | Disposition: A | Discharge status: Home / Self Care | Payer: Medicare Other | Attending: Internal Medicine | Admitting: Internal Medicine

## 2017-11-24 ENCOUNTER — Emergency Department: Payer: Medicare Other

## 2017-11-24 ENCOUNTER — Other Ambulatory Visit: Payer: Self-pay

## 2017-11-24 ENCOUNTER — Observation Stay: Payer: Medicare Other

## 2017-11-24 DIAGNOSIS — N132 Hydronephrosis with renal and ureteral calculous obstruction: Principal | ICD-10-CM | POA: Insufficient documentation

## 2017-11-24 DIAGNOSIS — F419 Anxiety disorder, unspecified: Secondary | ICD-10-CM | POA: Insufficient documentation

## 2017-11-24 DIAGNOSIS — N201 Calculus of ureter: Secondary | ICD-10-CM

## 2017-11-24 DIAGNOSIS — N200 Calculus of kidney: Secondary | ICD-10-CM

## 2017-11-24 DIAGNOSIS — R739 Hyperglycemia, unspecified: Secondary | ICD-10-CM | POA: Diagnosis not present

## 2017-11-24 DIAGNOSIS — I10 Essential (primary) hypertension: Secondary | ICD-10-CM | POA: Diagnosis not present

## 2017-11-24 DIAGNOSIS — Z79899 Other long term (current) drug therapy: Secondary | ICD-10-CM | POA: Diagnosis not present

## 2017-11-24 DIAGNOSIS — I480 Paroxysmal atrial fibrillation: Secondary | ICD-10-CM | POA: Diagnosis not present

## 2017-11-24 DIAGNOSIS — E1165 Type 2 diabetes mellitus with hyperglycemia: Secondary | ICD-10-CM | POA: Diagnosis not present

## 2017-11-24 DIAGNOSIS — Z7982 Long term (current) use of aspirin: Secondary | ICD-10-CM | POA: Insufficient documentation

## 2017-11-24 DIAGNOSIS — Z87891 Personal history of nicotine dependence: Secondary | ICD-10-CM | POA: Diagnosis not present

## 2017-11-24 DIAGNOSIS — Z7951 Long term (current) use of inhaled steroids: Secondary | ICD-10-CM | POA: Insufficient documentation

## 2017-11-24 DIAGNOSIS — E782 Mixed hyperlipidemia: Secondary | ICD-10-CM | POA: Diagnosis not present

## 2017-11-24 DIAGNOSIS — R109 Unspecified abdominal pain: Secondary | ICD-10-CM | POA: Diagnosis present

## 2017-11-24 LAB — CBC WITH DIFFERENTIAL/PLATELET
BASOS PCT: 0 %
Basophils Absolute: 0 10*3/uL (ref 0–0.1)
EOS ABS: 0 10*3/uL (ref 0–0.7)
Eosinophils Relative: 0 %
HEMATOCRIT: 42.2 % (ref 40.0–52.0)
HEMOGLOBIN: 14.1 g/dL (ref 13.0–18.0)
LYMPHS ABS: 1 10*3/uL (ref 1.0–3.6)
Lymphocytes Relative: 9 %
MCH: 28.9 pg (ref 26.0–34.0)
MCHC: 33.5 g/dL (ref 32.0–36.0)
MCV: 86.3 fL (ref 80.0–100.0)
MONOS PCT: 5 %
Monocytes Absolute: 0.5 10*3/uL (ref 0.2–1.0)
NEUTROS ABS: 8.7 10*3/uL — AB (ref 1.4–6.5)
NEUTROS PCT: 86 %
Platelets: 184 10*3/uL (ref 150–440)
RBC: 4.88 MIL/uL (ref 4.40–5.90)
RDW: 14.1 % (ref 11.5–14.5)
WBC: 10.3 10*3/uL (ref 3.8–10.6)

## 2017-11-24 LAB — BASIC METABOLIC PANEL
ANION GAP: 10 (ref 5–15)
BUN: 22 mg/dL (ref 8–23)
CALCIUM: 7.9 mg/dL — AB (ref 8.9–10.3)
CHLORIDE: 105 mmol/L (ref 98–111)
CO2: 26 mmol/L (ref 22–32)
Creatinine, Ser: 1.01 mg/dL (ref 0.61–1.24)
GFR calc non Af Amer: >60 mL/min (ref >60)
GLUCOSE: 138 mg/dL — AB (ref 70–99)
POTASSIUM: 4.1 mmol/L (ref 3.5–5.1)
Sodium: 141 mmol/L (ref 135–145)

## 2017-11-24 LAB — URINALYSIS, COMPLETE (UACMP) WITH MICROSCOPIC
Bilirubin Urine: NEGATIVE
GLUCOSE, UA: NEGATIVE mg/dL
Ketones, ur: NEGATIVE mg/dL
Leukocytes, UA: NEGATIVE
Nitrite: NEGATIVE
Protein, ur: 30 mg/dL — AB
RBC / HPF: >50 RBC/hpf — ABNORMAL HIGH (ref 0–5)
SPECIFIC GRAVITY, URINE: 1.025 (ref 1.005–1.030)
SQUAMOUS EPITHELIAL / LPF: NONE SEEN (ref 0–5)
pH: 5 (ref 5.0–8.0)

## 2017-11-24 MED ORDER — FENTANYL CITRATE (PF) 100 MCG/2ML IJ SOLN
100.0000 ug | Freq: Once | INTRAMUSCULAR | Status: DC
  Filled 2017-11-24: qty 2

## 2017-11-24 MED ORDER — LIDOCAINE HCL (CARDIAC) PF 100 MG/5ML IV SOSY
1.5000 mg/kg | PREFILLED_SYRINGE | Freq: Once | INTRAVENOUS | Status: AC
  Administered 2017-11-24: 160 mg via INTRAVENOUS
  Filled 2017-11-24: qty 10

## 2017-11-24 MED ORDER — CALCIUM CARBONATE-VITAMIN D 500-200 MG-UNIT PO TABS
1.0000 | ORAL_TABLET | Freq: Every day | ORAL | Status: DC
  Filled 2017-11-24: qty 1

## 2017-11-24 MED ORDER — ASPIRIN 81 MG PO CHEW
81.0000 mg | CHEWABLE_TABLET | Freq: Every day | ORAL | Status: DC
  Filled 2017-11-24: qty 1

## 2017-11-24 MED ORDER — HYDROMORPHONE HCL 1 MG/ML IJ SOLN: 0.5000 mg | INTRAMUSCULAR | Status: DC | PRN

## 2017-11-24 MED ORDER — SENNOSIDES-DOCUSATE SODIUM 8.6-50 MG PO TABS: 1.0000 | ORAL_TABLET | Freq: Every evening | ORAL | Status: DC | PRN

## 2017-11-24 MED ORDER — VENLAFAXINE HCL ER 75 MG PO CP24
75.0000 mg | ORAL_CAPSULE | Freq: Every day | ORAL | Status: DC
  Administered 2017-11-24: 75 mg via ORAL
  Filled 2017-11-24: qty 1

## 2017-11-24 MED ORDER — KETOROLAC TROMETHAMINE 30 MG/ML IJ SOLN
15.0000 mg | Freq: Once | INTRAMUSCULAR | Status: AC
  Administered 2017-11-24: 15 mg via INTRAVENOUS
  Filled 2017-11-24: qty 1

## 2017-11-24 MED ORDER — ATORVASTATIN CALCIUM 20 MG PO TABS
40.0000 mg | ORAL_TABLET | Freq: Every day | ORAL | Status: DC
  Filled 2017-11-24: qty 2

## 2017-11-24 MED ORDER — LISINOPRIL 5 MG PO TABS
5.0000 mg | ORAL_TABLET | Freq: Every day | ORAL | Status: DC
  Administered 2017-11-24: 5 mg via ORAL
  Filled 2017-11-24: qty 1

## 2017-11-24 MED ORDER — ACETAMINOPHEN 650 MG RE SUPP: 650.0000 mg | Freq: Four times a day (QID) | RECTAL | Status: DC | PRN

## 2017-11-24 MED ORDER — TAMSULOSIN HCL 0.4 MG PO CAPS
0.4000 mg | ORAL_CAPSULE | Freq: Once | ORAL | Status: DC
  Filled 2017-11-24: qty 1

## 2017-11-24 MED ORDER — MORPHINE SULFATE (PF) 4 MG/ML IV SOLN
8.0000 mg | Freq: Once | INTRAVENOUS | Status: AC
  Administered 2017-11-24: 8 mg via INTRAVENOUS
  Filled 2017-11-24: qty 2

## 2017-11-24 MED ORDER — HALOPERIDOL LACTATE 5 MG/ML IJ SOLN
INTRAMUSCULAR | Status: AC
  Filled 2017-11-24: qty 1

## 2017-11-24 MED ORDER — ONDANSETRON HCL 4 MG/2ML IJ SOLN
INTRAMUSCULAR | Status: AC
  Administered 2017-11-24: 4 mg
  Filled 2017-11-24: qty 2

## 2017-11-24 MED ORDER — ONDANSETRON HCL 4 MG PO TABS: 4.0000 mg | ORAL_TABLET | Freq: Four times a day (QID) | ORAL | Status: DC | PRN

## 2017-11-24 MED ORDER — SODIUM CHLORIDE 0.9 % IV SOLN
1.0000 g | INTRAVENOUS | Status: DC
  Administered 2017-11-24: 1 g via INTRAVENOUS
  Filled 2017-11-24: qty 10
  Filled 2017-11-24: qty 1

## 2017-11-24 MED ORDER — TAMSULOSIN HCL 0.4 MG PO CAPS: 0.4000 mg | ORAL_CAPSULE | Freq: Every day | ORAL | 0 refills | Status: DC

## 2017-11-24 MED ORDER — TRAMADOL HCL 50 MG PO TABS: 50.0000 mg | ORAL_TABLET | Freq: Four times a day (QID) | ORAL | Status: DC | PRN

## 2017-11-24 MED ORDER — LACTATED RINGERS IV SOLN
INTRAVENOUS | Status: AC
  Administered 2017-11-24: 06:00:00 via INTRAVENOUS

## 2017-11-24 MED ORDER — ATENOLOL 25 MG PO TABS
25.0000 mg | ORAL_TABLET | Freq: Two times a day (BID) | ORAL | Status: DC
  Administered 2017-11-24: 25 mg via ORAL
  Filled 2017-11-24: qty 1

## 2017-11-24 MED ORDER — FENTANYL CITRATE (PF) 100 MCG/2ML IJ SOLN
100.0000 ug | Freq: Once | INTRAMUSCULAR | Status: AC
  Administered 2017-11-24: 100 ug via INTRAVENOUS
  Filled 2017-11-24: qty 2

## 2017-11-24 MED ORDER — ACETAMINOPHEN 325 MG PO TABS: 650.0000 mg | ORAL_TABLET | Freq: Four times a day (QID) | ORAL | Status: DC | PRN

## 2017-11-24 MED ORDER — HALOPERIDOL LACTATE 5 MG/ML IJ SOLN
2.5000 mg | Freq: Once | INTRAMUSCULAR | Status: AC
  Administered 2017-11-24: 2.5 mg via INTRAVENOUS

## 2017-11-24 MED ORDER — KETOROLAC TROMETHAMINE 30 MG/ML IJ SOLN: 30.0000 mg | Freq: Three times a day (TID) | INTRAMUSCULAR | Status: DC | PRN

## 2017-11-24 MED ORDER — ONDANSETRON HCL 4 MG/2ML IJ SOLN: 4.0000 mg | Freq: Four times a day (QID) | INTRAMUSCULAR | Status: DC | PRN

## 2017-11-24 MED ORDER — TRAMADOL HCL 50 MG PO TABS: 50.0000 mg | ORAL_TABLET | Freq: Four times a day (QID) | ORAL | 0 refills | Status: AC | PRN

## 2017-11-24 MED ORDER — FLECAINIDE ACETATE 100 MG PO TABS: 200.0000 mg | ORAL_TABLET | Freq: Every day | ORAL | Status: DC | PRN

## 2017-11-24 MED ORDER — ENOXAPARIN SODIUM 40 MG/0.4ML ~~LOC~~ SOLN: 40.0000 mg | SUBCUTANEOUS | Status: DC

## 2017-11-24 MED ORDER — BISACODYL 5 MG PO TBEC: 5.0000 mg | DELAYED_RELEASE_TABLET | Freq: Every day | ORAL | Status: DC | PRN

## 2017-11-24 MED ORDER — TAMSULOSIN HCL 0.4 MG PO CAPS
0.4000 mg | ORAL_CAPSULE | Freq: Every day | ORAL | Status: DC
  Administered 2017-11-24: 0.4 mg via ORAL
  Filled 2017-11-24: qty 1

## 2017-11-24 NOTE — Progress Notes (Signed)
DISCHARGE NOTE:  Pt given discharge instructions and script (tramadol). Pt verbalized understanding. Pt wheeled to car by staff.

## 2017-11-24 NOTE — Progress Notes (Signed)
Patient seen and family at bedside.  Agree with admitting MD plan.  I will add tramadol as per patient's request for pain control.  I have asked patient to strain urine.

## 2017-11-24 NOTE — ED Provider Notes (Signed)
[facility] Emergency Department Provider Note  ____________________________________________   First MD Initiated Contact with Patient 11/24/17 [phone]     (approximate)  I have reviewed the triage vital signs and the nursing notes.   HISTORY  Chief Complaint Flank Pain   HPI [name] [name] is a 67 y.o. male who comes to the emergency department with 2 days of painless hematuria and then 1 hour of sudden onset severe left flank pain radiating towards his groin associated with nausea.  He has retched multiple times.  The pain is in his back and his left flank radiating towards his left groin.  No history of the same.  No testicular pain.  No fevers or chills.  No chest pain or shortness of breath.  Symptoms came on suddenly are severe and nothing seems to make them better or worse.    Past Medical History:  Diagnosis Date  . Anxiety   . Atrial fibrillation [facility])    sees Dr. Lovena Le  . Dizziness    or vertigo  . GERD (gastroesophageal reflux disease)   . HLD (hyperlipidemia)   . HTN (hypertension)   . Osteopenia   . Sinus bradycardia     Patient Active Problem List   Diagnosis Date Noted  . Left ureteral stone 11/24/2017  . Diabetes mellitus without complication ([place]) 98/26/4158  . ERECTILE DYSFUNCTION, NON-ORGANIC, MILD 11/20/2008  . ATRIAL FIBRILLATION 06/02/2008  . SINUS BRADYCARDIA 06/02/2008  . OSTEOPENIA 06/02/2008  . GERD 04/19/2008  . DIZZINESS OR VERTIGO 04/19/2008  . Hyperlipemia, mixed 10/05/2006  . Anxiety state 10/05/2006  . Essential hypertension 10/05/2006    Past Surgical History:  Procedure Laterality Date  . CARDIAC CATHETERIZATION  2003   normal  . COLONOSCOPY  06-30-12   per Dr. Fuller Plan, adenomatous  polyps, repeat in 5 years  . POLYPECTOMY    . RETINAL LASER PROCEDURE Right    repair retinal tear   . WART REMOVAL     LEFT UPPER ARM  . WISDOM TOOTH EXTRACTION      Prior to Admission medications   Medication Sig  Start Date End Date Taking? Authorizing Provider  aspirin 81 MG tablet Take 1 tablet (81 mg total) by mouth daily. 08/15/16  Yes Laurey Morale, MD  atenolol (TENORMIN) 25 MG tablet Take 1 tablet (25 mg total) by mouth 2 (two) times daily. 08/19/17  Yes Laurey Morale, MD  atorvastatin (LIPITOR) 40 MG tablet Take 1 tablet (40 mg total) by mouth daily. 08/19/17  Yes Laurey Morale, MD  Calcium Carbonate-Vitamin D 600-400 MG-UNIT per tablet Take 1 tablet by mouth daily.     Yes [provider]  fluticasone (FLONASE) 50 MCG/ACT nasal spray Place 2 sprays into both nostrils daily.   Yes [provider]  lisinopril (PRINIVIL,ZESTRIL) 5 MG tablet Take 1 tablet (5 mg total) by mouth daily. 08/19/17  Yes Laurey Morale, MD  sildenafil (REVATIO) 20 MG tablet Take 5 tabs as needed 08/19/17  Yes Laurey Morale, MD  venlafaxine XR (EFFEXOR-XR) 75 MG 24 hr capsule Take 1 capsule (75 mg total) by mouth daily with breakfast. 08/19/17  Yes Laurey Morale, MD  flecainide [facility]) 100 MG tablet May take 2 tablets daily as needed for breakthrough a-fib 05/13/16   Evans Lance, MD    Allergies Sulfa antibiotics and Paroxetine  Family History  Problem Relation Age of Onset  . Hodgkin's lymphoma Father   . Hyperlipidemia Other   . Hypertension  Mother   . Stroke Mother   . Hypertension Brother   . Heart attack Brother   . Prostate cancer Brother        1st degree relative <50  . Diabetes Brother        1st degree relative  . Hypertension Brother   . Multiple myeloma Brother   . Heart attack Brother   . Colon cancer Neg Hx     Social History Social History   Tobacco Use  . Smoking status: Former Smoker    Last attempt to quit: 03/11/1975    Years since quitting: 42.7  . Smokeless tobacco: Never Used  Substance Use Topics  . Alcohol use: Yes    Alcohol/week: 0.0 standard drinks    Comment: couple times a month-SOCIALLY 1 BEER A MONTH  . Drug use: No    Review of  Systems Constitutional: No fever/chills Eyes: No visual changes. ENT: No sore throat. Cardiovascular: Denies chest pain. Respiratory: Denies shortness of breath. Gastrointestinal: Positive for abdominal pain.  Positive for nausea, positive for vomiting.  No diarrhea.  No constipation. Genitourinary: Positive for hematuria Musculoskeletal: Positive for back pain. Skin: Negative for rash. Neurological: Negative for headaches, focal weakness or numbness.   ____________________________________________   PHYSICAL EXAM:  VITAL SIGNS: ED Triage Vitals  Enc Vitals Group     BP 11/24/17 0135 (!) 157/80     Pulse Rate 11/24/17 0134 63     Resp 11/24/17 0134 (!) 24     Temp --      Temp src --      SpO2 11/24/17 0134 99 %     Weight 11/24/17 0133 235 lb (106.6 kg)     Height 11/24/17 0133 6' (1.829 m)     Head Circumference --      Peak Flow --      Pain Score --      Pain Loc --      Pain Edu? --      Excl. in [place]? --     Constitutional: Alert and oriented x4 appears miserable writhing on the bed crying in pain Eyes: PERRL EOMI. Head: Atraumatic. Nose: No congestion/rhinnorhea. Mouth/Throat: No trismus Neck: No stridor.   Cardiovascular: Normal rate, regular rhythm. Grossly normal heart sounds.  Good peripheral circulation. Respiratory: Normal respiratory effort.  No retractions. Lungs CTAB and moving good air Gastrointestinal: Soft nondistended nontender no rebound or guarding no peritonitis no costovertebral tenderness Musculoskeletal: No lower extremity edema   Neurologic:  Normal speech and language. No gross focal neurologic deficits are appreciated. Skin: Mildly diaphoretic Psychiatric: Mood and affect are normal. Speech and behavior are normal.    ____________________________________________   DIFFERENTIAL includes but not limited to  Pyelonephritis, nephrolithiasis, infected stone, AAA ____________________________________________   LABS (all labs ordered are  listed, but only abnormal results are displayed)  Labs Reviewed  URINALYSIS, COMPLETE (UACMP) WITH MICROSCOPIC - Abnormal; Notable for the following components:      Result Value   Color, Urine AMBER (*)    APPearance HAZY (*)    Hgb urine dipstick LARGE (*)    Protein, ur 30 (*)    RBC / HPF >50 (*)    Bacteria, UA RARE (*)    All other components within normal limits  BASIC METABOLIC PANEL - Abnormal; Notable for the following components:   Glucose, Bld 138 (*)    Calcium 7.9 (*)    All other components within normal limits  HIV ANTIBODY (ROUTINE TESTING W REFLEX)  CALCIUM, IONIZED  CBC WITH DIFFERENTIAL/PLATELET    Lab work reviewed by me with normal renal function.  Hematuria with no evidence of infection __________________________________________  EKG   ____________________________________________  RADIOLOGY  CT stone reviewed by me shows 4 mm left-sided mid ureter stone ____________________________________________   PROCEDURES  Procedure(s) performed: no  Procedures  Critical Care performed: no  ____________________________________________   INITIAL IMPRESSION / ASSESSMENT AND PLAN / ED COURSE  Pertinent labs & imaging results that were available during my care of the patient were reviewed by me and considered in my medical decision making (see chart for details).   As part of my medical decision making, I reviewed the following data within the [place] History obtained from family if available, nursing notes, old chart and ekg, as well as notes from prior ED visits.      ----------------------------------------- 4:10 AM on 11/24/2017 -----------------------------------------  After 100 mcg of IV fentanyl, 15 mg of IV Toradol, 1.5 mg/kg of IV lidocaine, and 8 mg of IV morphine the patient is still writhing in pain and extremely uncomfortable appearing.  I offered him outpatient management versus inpatient pain control and he is not  comfortable going home with the degree of pain which I think is actually entirely reasonable.  The Toradol and the fentanyl seemed to help before swell redosed those now and admit him to the hospital.  ____________________________________________   FINAL CLINICAL IMPRESSION(S) / ED DIAGNOSES  Final diagnoses:  Kidney stone      NEW MEDICATIONS STARTED DURING THIS VISIT:  Current Discharge Medication List       Note:  This document was prepared using Dragon voice recognition software and may include unintentional dictation errors.     Darel Hong, MD 11/24/17 [phone]

## 2017-11-24 NOTE — Discharge Summary (Signed)
[name] [name] NAME: [name] [name]    MR#:  [id number]  DATE OF BIRTH:  1950/05/07  DATE OF ADMISSION:  11/24/2017 ADMITTING PHYSICIAN: Arta Silence, MD  DATE OF DISCHARGE: 11/24/2017  PRIMARY CARE PHYSICIAN: Laurey Morale, MD    ADMISSION DIAGNOSIS:  Kidney stone [N20.0]  DISCHARGE DIAGNOSIS:  Active Problems:   Left ureteral stone   SECONDARY DIAGNOSIS:   Past Medical History:  Diagnosis Date  . Anxiety   . Atrial fibrillation [facility])    sees Dr. Lovena Le  . Dizziness    or vertigo  . GERD (gastroesophageal reflux disease)   . HLD (hyperlipidemia)   . HTN (hypertension)   . Osteopenia   . Sinus bradycardia     HOSPITAL COURSE:   67 y/o male presented with flank pain.  1. URetral stone: AS per Urology patient may be discharged home to pass stone.  He will continue Tamsulosin. DISCHARGE CONDITIONS AND DIET:  Stable regular doet  CONSULTS OBTAINED:  Treatment Team:  Arta Silence, MD Abbie Sons, MD  DRUG ALLERGIES:   Allergies  Allergen Reactions  . Sulfa Antibiotics Hives  . Paroxetine Other (See Comments)    NOT ABLE TO TOLERATE    DISCHARGE MEDICATIONS:   Allergies as of 11/24/2017      Reactions   Sulfa Antibiotics Hives   Paroxetine Other (See Comments)   NOT ABLE TO TOLERATE      Medication List    TAKE these medications   aspirin 81 MG tablet Take 1 tablet (81 mg total) by mouth daily.   atenolol 25 MG tablet Commonly known as:  TENORMIN Take 1 tablet (25 mg total) by mouth 2 (two) times daily.   atorvastatin 40 MG tablet Commonly known as:  LIPITOR Take 1 tablet (40 mg total) by mouth daily.   Calcium Carbonate-Vitamin D 600-400 MG-UNIT tablet Take 1 tablet by mouth daily.   flecainide 100 MG tablet Commonly known as:  TAMBOCOR May take 2 tablets daily as needed for breakthrough a-fib   fluticasone 50 MCG/ACT nasal spray Commonly known as:  FLONASE Place 2  sprays into both nostrils daily.   lisinopril 5 MG tablet Commonly known as:  PRINIVIL,ZESTRIL Take 1 tablet (5 mg total) by mouth daily.   sildenafil 20 MG tablet Commonly known as:  REVATIO Take 5 tabs as needed   traMADol 50 MG tablet Commonly known as:  ULTRAM Take 1 tablet (50 mg total) by mouth every 6 (six) hours as needed for up to 5 days for moderate pain.   venlafaxine XR 75 MG 24 hr capsule Commonly known as:  EFFEXOR-XR Take 1 capsule (75 mg total) by mouth daily with breakfast.         Today   CHIEF COMPLAINT:  Wants to go home to pass stone   VITAL SIGNS:  Blood pressure (!) 146/80, pulse 61, temperature 98.6 F (37 C), temperature source Oral, resp. rate 20, height 6' (1.829 m), weight 106.6 kg, SpO2 99 %.   REVIEW OF SYSTEMS:  Review of Systems  Constitutional: Negative.  Negative for chills, fever and malaise/fatigue.  HENT: Negative.  Negative for ear discharge, ear pain, hearing loss, nosebleeds and sore throat.   Eyes: Negative.  Negative for blurred vision and pain.  Respiratory: Negative.  Negative for cough, hemoptysis, shortness of breath and wheezing.   Cardiovascular: Negative.  Negative for chest pain, palpitations and leg swelling.  Gastrointestinal: Negative.  Negative for abdominal  pain, blood in stool, diarrhea, nausea and vomiting.  Genitourinary: Negative.  Negative for dysuria.  Musculoskeletal: Negative.  Negative for back pain.  Skin: Negative.   Neurological: Negative for dizziness, tremors, speech change, focal weakness, seizures and headaches.  Endo/Heme/Allergies: Negative.  Does not bruise/bleed easily.  Psychiatric/Behavioral: Negative.  Negative for depression, hallucinations and suicidal ideas.     PHYSICAL EXAMINATION:  GENERAL:  67 y.o.-year-old patient lying in the bed with no acute distress.  NECK:  Supple, no jugular venous distention. No thyroid enlargement, no tenderness.  LUNGS: Normal breath sounds  bilaterally, no wheezing, rales,rhonchi  No use of accessory muscles of respiration.  CARDIOVASCULAR: S1, S2 normal. No murmurs, rubs, or gallops.  ABDOMEN: Soft, non-tender, non-distended. Bowel sounds present. No organomegaly or mass.  EXTREMITIES: No pedal edema, cyanosis, or clubbing.  PSYCHIATRIC: The patient is alert and oriented x 3.  SKIN: No obvious rash, lesion, or ulcer.   DATA REVIEW:   CBC Recent Labs  Lab 11/24/17 0836  WBC 10.3  HGB 14.1  HCT 42.2  PLT 184    Chemistries  Recent Labs  Lab 11/24/17 0300  NA 141  K 4.1  CL 105  CO2 26  GLUCOSE 138*  BUN 22  CREATININE 1.01  CALCIUM 7.9*    Cardiac Enzymes No results for input(s): TROPONINI in the last 168 hours.  Microbiology Results  @MICRORSLT48 @  RADIOLOGY:  Dg Abd 1 View  Result Date: 11/24/2017 CLINICAL DATA:  male with a known history of HTN, HLD, PAF (ASA81, no AC) p/w LLQ AP + L flank/low back pain. He states he went to sleep @APPROX .0100AM on Tuesday 11/24/2017 morning. He was woken from sleep APPROX.39min later w/ sharp LLQ AP, mi.*comment was truncated* EXAM: ABDOMEN - 1 VIEW COMPARISON:  None. FINDINGS: Faint calcification at the L3-L4 level measuring 2 mm corresponds to the ureteral calculus seen on comparison CT. Calculus is difficult define on the background of bowel gas. No bowel obstruction. IMPRESSION: Subtle mid LEFT ureteral calculus. Electronically Signed   By: Suzy Bouchard M.D.   On: 11/24/2017 14:05   Ct Renal Stone Study  Result Date: 11/24/2017 CLINICAL DATA:  Initial evaluation for acute flank pain. EXAM: CT ABDOMEN AND PELVIS WITHOUT CONTRAST TECHNIQUE: Multidetector CT imaging of the abdomen and pelvis was performed following the standard protocol without IV contrast. COMPARISON:  Prior CT from 01/26/2012. FINDINGS: Lower chest: Minimal subsegmental atelectatic changes present within the lung bases. Visualized lungs otherwise clear. Hepatobiliary: Liver demonstrates a normal  unenhanced appearance. Gallbladder within normal limits. No biliary dilatation. Pancreas: Pancreas within normal limits. Spleen: Spleen within normal limits. Adrenals/Urinary Tract: Adrenal glands are normal. Right kidney unremarkable without nephrolithiasis or hydronephrosis. No radiopaque calculi seen along the course of the right renal collecting system. No right-sided hydroureter. On the left, there is an obstructive 4 mm stone within the mid left ureter with secondary mild left hydroureteronephrosis. Additional punctate 2 mm nonobstructive stone within the lower pole the left kidney. No other left-sided radiopaque calculi identified. Bladder largely decompressed without acute abnormality. No layering stones within the bladder lumen. Stomach/Bowel: Stomach within normal limits. No evidence for bowel obstruction. Normal appendix. Colonic diverticulosis without evidence for acute diverticulitis. No acute inflammatory changes seen about the bowels. Vascular/Lymphatic: Moderate aorto bi-iliac atherosclerotic disease. No aneurysm. No adenopathy. Reproductive: Prostate within normal limits. Other: No free air or fluid. Musculoskeletal: No acute osseous abnormality. No worrisome lytic or blastic osseous lesions. Degenerative spondylolysis present at L4-5 and L5-S1. IMPRESSION: 1. 4 mm  obstructive stone within the mid left ureter with secondary mild left hydroureteronephrosis. 2. Additional punctate 2 mm nonobstructive left renal calculus. 3. No other acute intra-abdominal or pelvic process. 4. Colonic diverticulosis without evidence for acute diverticulitis. Electronically Signed   By: Jeannine Boga M.D.   On: 11/24/2017 02:17      Allergies as of 11/24/2017      Reactions   Sulfa Antibiotics Hives   Paroxetine Other (See Comments)   NOT ABLE TO TOLERATE      Medication List    TAKE these medications   aspirin 81 MG tablet Take 1 tablet (81 mg total) by mouth daily.   atenolol 25 MG  tablet Commonly known as:  TENORMIN Take 1 tablet (25 mg total) by mouth 2 (two) times daily.   atorvastatin 40 MG tablet Commonly known as:  LIPITOR Take 1 tablet (40 mg total) by mouth daily.   Calcium Carbonate-Vitamin D 600-400 MG-UNIT tablet Take 1 tablet by mouth daily.   flecainide 100 MG tablet Commonly known as:  TAMBOCOR May take 2 tablets daily as needed for breakthrough a-fib   fluticasone 50 MCG/ACT nasal spray Commonly known as:  FLONASE Place 2 sprays into both nostrils daily.   lisinopril 5 MG tablet Commonly known as:  PRINIVIL,ZESTRIL Take 1 tablet (5 mg total) by mouth daily.   sildenafil 20 MG tablet Commonly known as:  REVATIO Take 5 tabs as needed   traMADol 50 MG tablet Commonly known as:  ULTRAM Take 1 tablet (50 mg total) by mouth every 6 (six) hours as needed for up to 5 days for moderate pain.   venlafaxine XR 75 MG 24 hr capsule Commonly known as:  EFFEXOR-XR Take 1 capsule (75 mg total) by mouth daily with breakfast.         Management plans discussed with the patient and he is in agreement. Stable for discharge home  Patient should follow up with PCP  CODE STATUS:     Code Status Orders  (From admission, onward)         Start     Ordered   11/24/17 0559  Full code  Continuous     11/24/17 0558        Code Status History    This patient has a current code status but no historical code status.      TOTAL TIME TAKING CARE OF THIS PATIENT: 38 minutes.    Note: This dictation was prepared with Dragon dictation along with smaller phrase technology. Any transcriptional errors that result from this process are unintentional.  Damarien Nyman M.D on 11/24/2017 at 5:18 PM  Between 7am to 6pm - Pager - [phone] After 6pm go to [email or web address] - password [email or web address] [place] [facility]  Office  [phone]  CC: Primary care physician; Laurey Morale, MD

## 2017-11-24 NOTE — H&P (Signed)
[facility] NAME: [name] [name]    MR#:  [id number]  DATE OF BIRTH:  [birth year]  DATE OF ADMISSION:  11/24/2017  PRIMARY CARE PHYSICIAN: Laurey Morale, MD   REQUESTING/REFERRING PHYSICIAN: Darel Hong, MD  CHIEF COMPLAINT:   Chief Complaint  Patient presents with  . Flank Pain    HISTORY OF PRESENT ILLNESS:  [name] [name]  is a 67 y.o. male with a known history of HTN, HLD, PAF (ASA81, no AC) p/w LLQ AP + L flank/low back pain. He states he went to sleep @~0100AM on Tuesday 11/24/2017 morning. He was woken from sleep ~77mn later w/ sharp LLQ AP, migrating to the L flank/low back. He states the pain would go back and forth between these locations. He endorses hematuria since Saturday 09/14, but denies burning, dysuria, frequency, hesitancy, urgency, incontinence, dribbling, nocturia. He denies having had any other symptoms at home, but endorses having chills, diaphoresis and N/V in the ED. He denies CP, SOB, palpitations, fever, rigors, diarrhea, LH/LOC. He states he had a bladder stone 3-528yrago, for which he saw a UrDealerHe appears to be in mild distress 2/2 pain (after having received pain medication). He does not appear septic/toxic at the time of my assessment.  PAST MEDICAL HISTORY:   Past Medical History:  Diagnosis Date  . Anxiety   . [facility]   [facility] Dr[facility] TaLovena Le. Dizziness    or vertigo  . GERD (gastroesophageal reflux disease)   . HLD (hyperlipidemia)   . HTN (hypertension)   . Osteopenia   . Sinus bradycardia     PAST SURGICAL HISTORY:   Past Surgical History:  Procedure Laterality Date  . CARDIAC CATHETERIZATION  2003   normal  . COLONOSCOPY  06-30-12   per Dr. StFuller Planadenomatous  polyps, repeat in 5 years  . POLYPECTOMY    . RETINAL LASER PROCEDURE Right    repair retinal tear   . WART REMOVAL     LEFT UPPER ARM  . WISDOM TOOTH EXTRACTION      SOCIAL HISTORY:   Social  History   Tobacco Use  . Smoking status: Former Smoker    Last attempt to quit: 03/11/1975    Years since quitting: 42.7  . Smokeless tobacco: Never Used  Substance Use Topics  . Alcohol use: Yes    Alcohol/week: 0.0 standard drinks    Comment: couple times a month-SOCIALLY 1 BEER A MONTH    FAMILY HISTORY:   Family History  Problem Relation Age of Onset  . Hodgkin's lymphoma Father   . Hyperlipidemia Other   . Hypertension Mother   . Stroke Mother   . Hypertension Brother   . Heart attack Brother   . Prostate cancer Brother        1st degree relative <50  . Diabetes Brother        1st degree relative  . Hypertension Brother   . Multiple myeloma Brother   . Heart attack Brother   . Colon cancer Neg Hx     DRUG ALLERGIES:   Allergies  Allergen Reactions  . Sulfa Antibiotics Hives  . Paroxetine Other (See Comments)    NOT ABLE TO TOLERATE    REVIEW OF SYSTEMS:   Review of Systems  Constitutional: Positive for chills and diaphoresis. Negative for fever, malaise/fatigue and weight loss.  HENT: Negative for congestion, ear pain, hearing loss, nosebleeds, sinus pain, sore throat and tinnitus.  Eyes: Negative for blurred vision, double vision and photophobia.  Respiratory: Negative for cough, hemoptysis, sputum production, shortness of breath and wheezing.   Cardiovascular: Negative for chest pain, palpitations, orthopnea, claudication, leg swelling and PND.  Gastrointestinal: Positive for abdominal pain, nausea and vomiting. Negative for blood in stool, constipation, diarrhea, heartburn and melena.  Genitourinary: Positive for flank pain and hematuria. Negative for dysuria, frequency and urgency.  Musculoskeletal: Positive for back pain. Negative for falls, joint pain, myalgias and neck pain.  Skin: Negative for itching and rash.  Neurological: Negative for dizziness, tingling, tremors, sensory change, speech change, focal weakness, seizures, loss of consciousness,  weakness and headaches.  Psychiatric/Behavioral: Negative for memory loss. The patient does not have insomnia.    MEDICATIONS AT HOME:   Prior to Admission medications   Medication Sig Start Date End Date Taking? Authorizing Provider  aspirin 81 MG tablet Take 1 tablet (81 mg total) by mouth daily. 08/15/16  Yes Laurey Morale, MD  atenolol (TENORMIN) 25 MG tablet Take 1 tablet (25 mg total) by mouth 2 (two) times daily. 08/19/17  Yes Laurey Morale, MD  atorvastatin (LIPITOR) 40 MG tablet Take 1 tablet (40 mg total) by mouth daily. 08/19/17  Yes Laurey Morale, MD  Calcium Carbonate-Vitamin D 600-400 MG-UNIT per tablet Take 1 tablet by mouth daily.     Yes [provider]  fluticasone (FLONASE) 50 MCG/ACT nasal spray Place 2 sprays into both nostrils daily.   Yes [provider]  lisinopril (PRINIVIL,ZESTRIL) 5 MG tablet Take 1 tablet (5 mg total) by mouth daily. 08/19/17  Yes Laurey Morale, MD  sildenafil (REVATIO) 20 MG tablet Take 5 tabs as needed 08/19/17  Yes Laurey Morale, MD  venlafaxine XR (EFFEXOR-XR) 75 MG 24 hr capsule Take 1 capsule (75 mg total) by mouth daily with breakfast. 08/19/17  Yes Laurey Morale, MD  flecainide [facility]) 100 MG tablet May take 2 tablets daily as needed for breakthrough a-fib 05/13/16   Evans Lance, MD      VITAL SIGNS:  Blood pressure 137/69, pulse (!) 59, temperature 98 F (36.7 C), temperature source Oral, resp. rate 17, height 6' (1.829 m), weight 106.6 kg, SpO2 100 %.  PHYSICAL EXAMINATION:  Physical Exam  Constitutional: He is oriented to person, place, and time. He appears well-developed and well-nourished. He is active and cooperative.  Non-toxic appearance. He does not have a sickly appearance. He does not appear ill. He appears distressed (Mild distress 2/2 abdominal/flank/back pain). He is not intubated.  HENT:  Head: Normocephalic and atraumatic.  Mouth/Throat: Oropharynx is clear and moist. No oropharyngeal exudate.    Eyes: Conjunctivae, EOM and lids are normal. No scleral icterus.  Neck: Neck supple. No JVD present. No thyromegaly present.  Cardiovascular: Normal rate, regular rhythm, S1 normal, S2 normal and normal heart sounds.  No extrasystoles are present. Exam reveals no gallop, no S3, no S4, no distant heart sounds and no friction rub.  No murmur heard. Pulmonary/Chest: Effort normal and breath sounds normal. No accessory muscle usage or stridor. No apnea, no tachypnea and no bradypnea. He is not intubated. No respiratory distress. He has no decreased breath sounds. He has no wheezes. He has no rhonchi. He has no rales.  Abdominal: Soft. Bowel sounds are normal. He exhibits no distension. There is tenderness in the left lower quadrant. There is CVA tenderness. There is no rebound and no guarding.  Musculoskeletal: Normal range of motion. He exhibits no edema or  tenderness.  Lymphadenopathy:    He has no cervical adenopathy.  Neurological: He is alert and oriented to person, place, and time. He is not disoriented.  Skin: Skin is warm, dry and intact. No rash noted. He is not diaphoretic. No erythema. No pallor.  Psychiatric: He has a normal mood and affect. His speech is normal and behavior is normal. Judgment and thought content normal.   Non-bradycardic on exam (HR 70s). LABORATORY PANEL:   CBC No results for input(s): WBC, HGB, HCT, PLT in the last 168 hours. ------------------------------------------------------------------------------------------------------------------  Chemistries  Recent Labs  Lab 11/24/17 0300  NA 141  K 4.1  CL 105  CO2 26  GLUCOSE 138*  BUN 22  CREATININE 1.01  CALCIUM 7.9*   ------------------------------------------------------------------------------------------------------------------  Cardiac Enzymes No results for input(s): TROPONINI in the last 168  hours. ------------------------------------------------------------------------------------------------------------------  RADIOLOGY:  Ct Renal Stone Study  Result Date: 11/24/2017 CLINICAL DATA:  Initial evaluation for acute flank pain. EXAM: CT ABDOMEN AND PELVIS WITHOUT CONTRAST TECHNIQUE: Multidetector CT imaging of the abdomen and pelvis was performed following the standard protocol without IV contrast. COMPARISON:  Prior CT from 01/26/2012. FINDINGS: Lower chest: Minimal subsegmental atelectatic changes present within the lung bases. Visualized lungs otherwise clear. Hepatobiliary: Liver demonstrates a normal unenhanced appearance. Gallbladder within normal limits. No biliary dilatation. Pancreas: Pancreas within normal limits. Spleen: Spleen within normal limits. Adrenals/Urinary Tract: Adrenal glands are normal. Right kidney unremarkable without nephrolithiasis or hydronephrosis. No radiopaque calculi seen along the course of the right renal collecting system. No right-sided hydroureter. On the left, there is an obstructive 4 mm stone within the mid left ureter with secondary mild left hydroureteronephrosis. Additional punctate 2 mm nonobstructive stone within the lower pole the left kidney. No other left-sided radiopaque calculi identified. Bladder largely decompressed without acute abnormality. No layering stones within the bladder lumen. Stomach/Bowel: Stomach within normal limits. No evidence for bowel obstruction. Normal appendix. Colonic diverticulosis without evidence for acute diverticulitis. No acute inflammatory changes seen about the bowels. Vascular/Lymphatic: Moderate aorto bi-iliac atherosclerotic disease. No aneurysm. No adenopathy. Reproductive: Prostate within normal limits. Other: No free air or fluid. Musculoskeletal: No acute osseous abnormality. No worrisome lytic or blastic osseous lesions. Degenerative spondylolysis present at L4-5 and L5-S1. IMPRESSION: 1. 4 mm obstructive stone  within the mid left ureter with secondary mild left hydroureteronephrosis. 2. Additional punctate 2 mm nonobstructive left renal calculus. 3. No other acute intra-abdominal or pelvic process. 4. Colonic diverticulosis without evidence for acute diverticulitis. Electronically Signed   By: Jeannine Boga M.D.   On: 11/24/2017 02:17   IMPRESSION AND PLAN:   A/P: 72M L ureteral stone, (+) intractable LLQ AP + L flank/low back pain. Hyperglycemia, hypocalcemia. -Ureteral stone, intractable pain: Pt w/ acute-onset sharp L abdominal/flank/back pain. CT (+) 28m L ureteral stone w/ mild hydroureteronephrosis. Creatinine normal. Afebrile. WBC pending. U/A contaminated, (+) > 50 RBC, (-) nitrite/leuk est. U/A may not reflect infxn proximal to obstructing L ureteral stone. Ceftriaxone. Flomax. Urology consult, conservative mgmt vs. ESWL vs. other. IVF, pain ctrl. -Hyperglycemia: Likely 2/2 N/V, dehydration. IVF. -Hypocalcemia: Ionized calcium. -c/w home meds. -FEN/GI: Cardiac diet as tolerated. -DVT PPx: Lovenox. -Code status: Full code. -Disposition: Observation, < 2 midnights.   All the records are reviewed and case discussed with ED provider. Management plans discussed with the patient, family and they are in agreement.  CODE STATUS: Full code.  TOTAL TIME TAKING CARE OF THIS PATIENT: 60 minutes.    PArta SilenceM.D on 11/24/2017 at 6:38  AM  Between 7am to 6pm - Pager - [phone]  After 6pm go to [email or web address] - password [email or web address] [facility]  [employer]  [facility]  Office  [phone]  CC: Primary care physician; Laurey Morale, MD   Note: This dictation was prepared with Dragon dictation along with smaller phrase technology. Any transcriptional errors that result from this process are unintentional.

## 2017-11-24 NOTE — Progress Notes (Signed)
Chaplain responded to and OR for and AD. Pt was alert with daughter at the bedside. Chaplain qualified Pt and gave education. Pt said wife is his HCPOA. Chaplain left AD for review. Pt prayed when informed that chaplain his here for support.    11/24/17 1300  Clinical Encounter Type  Visited With Patient and family together  Visit Type Initial  Referral From Physician  Spiritual Encounters  Spiritual Needs Brochure

## 2017-11-24 NOTE — ED Triage Notes (Signed)
Reports flank pain for approx 1 hours.

## 2017-11-24 NOTE — Consult Note (Signed)
Urology Consult  I have been asked to see the patient by Dr. Jodell Cipro, for evaluation and management of left renal colic secondary to a left proximal ureteral calculus..  Chief Complaint: Kidney stone  History of Present Illness: [name] [name] is a 67 y.o. year old male who presented to the ED early this morning with acute onset of severe left lower quadrant abdominal pain radiating to the left flank region.  His pain was described as severe without associated precipitating, aggravating or alleviating factors.  He denied fever/chills.  He had nausea without vomiting.  A stone protocol CT of the abdomen and pelvis was performed which showed a 3-4 mm left proximal ureteral calculus with mild hydronephrosis/hydroureter.  He had intermittent hematuria for 2 days prior to onset of his pain.  He states he was evaluated several years ago by urologist for microhematuria and apparently had a "calcification embedded" somewhere in his urinary tract which was felt to be the source of his microhematuria.  He received 8 mg of IV morphine/15 mg IV Toradol/100 mcg IV fentanyl without significant pain improvement.  The Toradol and fentanyl were redosed and he was admitted for further pain control by the hospitalist service.  He states since he left the ED he has been pain-free.   Past Medical History:  Diagnosis Date  . Anxiety   . Atrial fibrillation [facility])    sees Dr. Lovena Le  . Dizziness    or vertigo  . GERD (gastroesophageal reflux disease)   . HLD (hyperlipidemia)   . HTN (hypertension)   . Osteopenia   . Sinus bradycardia     Past Surgical History:  Procedure Laterality Date  . CARDIAC CATHETERIZATION  2003   normal  . COLONOSCOPY  06-30-12   per Dr. Fuller Plan, adenomatous  polyps, repeat in 5 years  . POLYPECTOMY    . RETINAL LASER PROCEDURE Right    repair retinal tear   . WART REMOVAL     LEFT UPPER ARM  . WISDOM TOOTH EXTRACTION      Home Medications:  Current Meds  Medication  Sig  . aspirin 81 MG tablet Take 1 tablet (81 mg total) by mouth daily.  Marland Kitchen atenolol (TENORMIN) 25 MG tablet Take 1 tablet (25 mg total) by mouth 2 (two) times daily.  Marland Kitchen atorvastatin (LIPITOR) 40 MG tablet Take 1 tablet (40 mg total) by mouth daily.  . Calcium Carbonate-Vitamin D 600-400 MG-UNIT per tablet Take 1 tablet by mouth daily.    . fluticasone (FLONASE) 50 MCG/ACT nasal spray Place 2 sprays into both nostrils daily.  Marland Kitchen lisinopril (PRINIVIL,ZESTRIL) 5 MG tablet Take 1 tablet (5 mg total) by mouth daily.  . sildenafil (REVATIO) 20 MG tablet Take 5 tabs as needed  . venlafaxine XR (EFFEXOR-XR) 75 MG 24 hr capsule Take 1 capsule (75 mg total) by mouth daily with breakfast.    Allergies:  Allergies  Allergen Reactions  . Sulfa Antibiotics Hives  . Paroxetine Other (See Comments)    NOT ABLE TO TOLERATE    Family History  Problem Relation Age of Onset  . Hodgkin's lymphoma Father   . Hyperlipidemia Other   . Hypertension Mother   . Stroke Mother   . Hypertension Brother   . Heart attack Brother   . Prostate cancer Brother        1st degree relative <50  . Diabetes Brother        1st degree relative  . Hypertension Brother   .  Multiple myeloma Brother   . Heart attack Brother   . Colon cancer Neg Hx     Social History:  reports that he quit smoking about 42 years ago. He has never used smokeless tobacco. He reports that he drinks alcohol. He reports that he does not use drugs.  ROS: A complete review of systems was performed.  All systems are negative except for pertinent findings as noted.  Physical Exam:  Vital signs in last 24 hours: Temp:  [98 F (36.7 C)-98.6 F (37 C)] 98.6 F (37 C) (09/17 0749) Pulse Rate:  [52-71] 61 (09/17 0749) Resp:  [17-24] 20 (09/17 0749) BP: (109-157)/(54-80) 146/80 (09/17 0749) SpO2:  [94 %-100 %] 99 % (09/17 0749) Weight:  [106.6 kg] 106.6 kg (09/17 0133) Constitutional:  Alert and oriented, No acute distress HEENT: [place] AT, moist  mucus membranes.  Trachea midline, no masses Cardiovascular: Regular rate and rhythm, no clubbing, cyanosis, or edema. Respiratory: Normal respiratory effort, lungs clear bilaterally GI: Abdomen is soft, nontender, nondistended, no abdominal masses GU: No CVA tenderness Skin: No rashes, bruises or suspicious lesions Lymph: No cervical or inguinal adenopathy Neurologic: Grossly intact, no focal deficits, moving all 4 extremities Psychiatric: Normal mood and affect   Laboratory Data:  Recent Labs    11/24/17 0836  WBC 10.3  HGB 14.1  HCT 42.2   Recent Labs    11/24/17 0300  NA 141  K 4.1  CL 105  CO2 26  GLUCOSE 138*  BUN 22  CREATININE 1.01  CALCIUM 7.9*    Radiologic Imaging: CT was personally reviewed Ct Renal Stone Study  Result Date: 11/24/2017 CLINICAL DATA:  Initial evaluation for acute flank pain. EXAM: CT ABDOMEN AND PELVIS WITHOUT CONTRAST TECHNIQUE: Multidetector CT imaging of the abdomen and pelvis was performed following the standard protocol without IV contrast. COMPARISON:  Prior CT from 01/26/2012. FINDINGS: Lower chest: Minimal subsegmental atelectatic changes present within the lung bases. Visualized lungs otherwise clear. Hepatobiliary: Liver demonstrates a normal unenhanced appearance. Gallbladder within normal limits. No biliary dilatation. Pancreas: Pancreas within normal limits. Spleen: Spleen within normal limits. Adrenals/Urinary Tract: Adrenal glands are normal. Right kidney unremarkable without nephrolithiasis or hydronephrosis. No radiopaque calculi seen along the course of the right renal collecting system. No right-sided hydroureter. On the left, there is an obstructive 4 mm stone within the mid left ureter with secondary mild left hydroureteronephrosis. Additional punctate 2 mm nonobstructive stone within the lower pole the left kidney. No other left-sided radiopaque calculi identified. Bladder largely decompressed without acute abnormality. No layering  stones within the bladder lumen. Stomach/Bowel: Stomach within normal limits. No evidence for bowel obstruction. Normal appendix. Colonic diverticulosis without evidence for acute diverticulitis. No acute inflammatory changes seen about the bowels. Vascular/Lymphatic: Moderate aorto bi-iliac atherosclerotic disease. No aneurysm. No adenopathy. Reproductive: Prostate within normal limits. Other: No free air or fluid. Musculoskeletal: No acute osseous abnormality. No worrisome lytic or blastic osseous lesions. Degenerative spondylolysis present at L4-5 and L5-S1. IMPRESSION: 1. 4 mm obstructive stone within the mid left ureter with secondary mild left hydroureteronephrosis. 2. Additional punctate 2 mm nonobstructive left renal calculus. 3. No other acute intra-abdominal or pelvic process. 4. Colonic diverticulosis without evidence for acute diverticulitis. Electronically Signed   By: Jeannine Boga M.D.   On: 11/24/2017 02:17    Impression:  67 year old male with left renal colic secondary to a 4 mm left proximal ureteral calculus.  He is presently pain-free.  He was informed there is a greater than 90%  chance of spontaneous passage of this calculus.  Recommendation:  If his pain can be controlled on oral analgesics would recommend discharging for a trial of passage.  Surgical options were discussed including ureteroscopic removal and shockwave lithotripsy.  A KUB was ordered to see if the calculus can be visualized on plain x-ray.  Continue tamsulosin for medical expulsive therapy.   11/24/2017, 1:10 PM  John Giovanni,  MD

## 2017-11-24 NOTE — Progress Notes (Signed)
Family Meeting Note  Advance Directive:no  Today a meeting took place with the Patient.spouse  The following clinical team members were present during this meeting MD The following were discussed:Patient's diagnosis: Kidney stone, Patient's progosis: > 12 months and Goals for treatment: Full Code  Additional follow-up to be provided: Chaplain consultation for advanced directives  Time spent during discussion: 16 minutes  [name] Brawley, MD

## 2017-11-25 LAB — HIV ANTIBODY (ROUTINE TESTING W REFLEX): HIV Screen 4th Generation wRfx: NONREACTIVE

## 2017-11-25 LAB — CALCIUM, IONIZED: Calcium, Ionized, Serum: 4.4 mg/dL — ABNORMAL LOW (ref 4.5–5.6)

## 2017-12-04 NOTE — Telephone Encounter (Signed)
Pt was admitted to the hospital on 9/17 for kidney stone.  Nothing further is needed.

## 2017-12-16 ENCOUNTER — Ambulatory Visit (AMBULATORY_SURGERY_CENTER): Payer: Self-pay | Admitting: *Deleted

## 2017-12-16 VITALS — Ht 72.0 in | Wt 236.0 lb

## 2017-12-16 DIAGNOSIS — Z86010 Personal history of colonic polyps: Secondary | ICD-10-CM

## 2017-12-16 MED ORDER — NA SULFATE-K SULFATE-MG SULF 17.5-3.13-1.6 GM/177ML PO SOLN: 1.0000 | Freq: Once | ORAL | 0 refills | Status: AC

## 2017-12-16 NOTE — Progress Notes (Signed)
PAY NO MORE THAN $50 COUPON TO PT IN PV TODAY No egg or soy allergy known to patient  No issues with past sedation with any surgeries  or procedures, no intubation problems   No diet pills per patient No home 02 use per patient  No blood thinners per patient  Pt denies issues with constipation  Hx of A fib- pt on flecanide- no blood thinners - has an episode of A FIb -1-2 times a year only  EMMI video sent to pt's e mail - pt declined

## 2018-01-04 ENCOUNTER — Telehealth: Payer: Self-pay | Admitting: Gastroenterology

## 2018-01-04 ENCOUNTER — Encounter: Payer: Self-pay | Admitting: Gastroenterology

## 2018-01-04 ENCOUNTER — Ambulatory Visit (AMBULATORY_SURGERY_CENTER): Payer: Medicare Other | Admitting: Gastroenterology

## 2018-01-04 VITALS — BP 127/63 | HR 48 | Temp 96.9°F | Resp 14 | Ht 72.0 in | Wt 240.0 lb

## 2018-01-04 DIAGNOSIS — D125 Benign neoplasm of sigmoid colon: Secondary | ICD-10-CM

## 2018-01-04 DIAGNOSIS — E669 Obesity, unspecified: Secondary | ICD-10-CM | POA: Diagnosis not present

## 2018-01-04 DIAGNOSIS — I10 Essential (primary) hypertension: Secondary | ICD-10-CM | POA: Diagnosis not present

## 2018-01-04 DIAGNOSIS — K635 Polyp of colon: Secondary | ICD-10-CM | POA: Diagnosis not present

## 2018-01-04 DIAGNOSIS — D122 Benign neoplasm of ascending colon: Secondary | ICD-10-CM

## 2018-01-04 DIAGNOSIS — E119 Type 2 diabetes mellitus without complications: Secondary | ICD-10-CM | POA: Diagnosis not present

## 2018-01-04 DIAGNOSIS — Z86010 Personal history of colonic polyps: Secondary | ICD-10-CM | POA: Diagnosis not present

## 2018-01-04 DIAGNOSIS — I4891 Unspecified atrial fibrillation: Secondary | ICD-10-CM | POA: Diagnosis not present

## 2018-01-04 HISTORY — PX: COLONOSCOPY: SHX174

## 2018-01-04 MED ORDER — SODIUM CHLORIDE 0.9 % IV SOLN: 500.0000 mL | Freq: Once | INTRAVENOUS | Status: DC

## 2018-01-04 NOTE — Op Note (Signed)
[place] Patient Name: [name] [name] Procedure Date: 01/04/2018 10:18 AM MRN: [id number] Endoscopist: Ladene Artist , MD Age: 67 Referring MD:  Date of Birth: [birth year] Gender: Male Account #: [id number] Procedure:                Colonoscopy Indications:              Surveillance: Personal history of adenomatous                            polyps on last colonoscopy 5 years ago Medicines:                Monitored Anesthesia Care Procedure:                Pre-Anesthesia Assessment:                           - Prior to the procedure, a History and Physical                            was performed, and patient medications and                            allergies were reviewed. The patient's tolerance of                            previous anesthesia was also reviewed. The risks                            and benefits of the procedure and the sedation                            options and risks were discussed with the patient.                            All questions were answered, and informed consent                            was obtained. Prior Anticoagulants: The patient has                            taken no previous anticoagulant or antiplatelet                            agents. ASA Grade Assessment: II - A patient with                            mild systemic disease. After reviewing the risks                            and benefits, the patient was deemed in                            satisfactory condition to undergo the procedure.  After obtaining informed consent, the colonoscope                            was passed under direct vision. Throughout the                            procedure, the patient's blood pressure, pulse, and                            oxygen saturations were monitored continuously. The                            Model PCF-[id number]) scope was introduced                            through the anus and  advanced to the the cecum,                            identified by appendiceal orifice and ileocecal                            valve. The ileocecal valve, appendiceal orifice,                            and rectum were photographed. The quality of the                            bowel preparation was good. The colonoscopy was                            performed without difficulty. The patient tolerated                            the procedure well. Scope In: 10:26:32 AM Scope Out: 10:50:52 AM Scope Withdrawal Time: 0 hours 20 minutes 16 seconds  Total Procedure Duration: 0 hours 24 minutes 20 seconds  Findings:                 The perianal and digital rectal examinations were                            normal.                           Four sessile polyps were found in the sigmoid colon                            (2) and ascending colon (2). The polyps were 7 to 8                            mm in size. These polyps were removed with a cold                            snare. Resection and retrieval were complete.  A 5 mm polyp was found in the ascending colon. The                            polyp was sessile. The polyp was removed with a                            cold biopsy forceps. Resection and retrieval were                            complete.                           Multiple medium-mouthed diverticula were found in                            the left colon. There was no evidence of                            diverticular bleeding.                           Internal hemorrhoids were found during                            retroflexion. The hemorrhoids were small and Grade                            I (internal hemorrhoids that do not prolapse).                           The exam was otherwise without abnormality on                            direct and retroflexion views. Complications:            No immediate complications. Estimated blood loss:                             None. Estimated Blood Loss:     Estimated blood loss: none. Impression:               - Four 7 to 8 mm polyps in the sigmoid colon and in                            the ascending colon, removed with a cold snare.                            Resected and retrieved.                           - One 5 mm polyp in the ascending colon, removed                            with a cold biopsy forceps. Resected and retrieved.                           -  Moderate diverticulosis in the left colon. There                            was no evidence of diverticular bleeding.                           - Internal hemorrhoids. Recommendation:           - Repeat colonoscopy in 3 - 5 years for                            surveillance pending pathology review.                           - Patient has a contact number available for                            emergencies. The signs and symptoms of potential                            delayed complications were discussed with the                            patient. Return to normal activities tomorrow.                            Written discharge instructions were provided to the                            patient.                           - High fiber diet.                           - Continue present medications.                           - Await pathology results. Ladene Artist, MD 01/04/2018 10:55:48 AM This report has been signed electronically.

## 2018-01-04 NOTE — Progress Notes (Signed)
Called to room to assist during endoscopic procedure.  Patient ID and intended procedure confirmed with present staff. Received instructions for my participation in the procedure from the performing physician.  

## 2018-01-04 NOTE — Telephone Encounter (Signed)
Left message on given phone number to come in anyway.

## 2018-01-04 NOTE — Progress Notes (Signed)
To recovery, report to RN, VSS. 

## 2018-01-04 NOTE — Patient Instructions (Signed)
Please read handouts provided. Continue present medications. High Fiber diet.     YOU HAD AN ENDOSCOPIC PROCEDURE TODAY AT [facility]:   Refer to the procedure report that was given to you for any specific questions about what was found during the examination.  If the procedure report does not answer your questions, please call your gastroenterologist to clarify.  If you requested that your care partner not be given the details of your procedure findings, then the procedure report has been included in a sealed envelope for you to review at your convenience later.  YOU SHOULD EXPECT: Some feelings of bloating in the abdomen. Passage of more gas than usual.  Walking can help get rid of the air that was put into your GI tract during the procedure and reduce the bloating. If you had a lower endoscopy (such as a colonoscopy or flexible sigmoidoscopy) you may notice spotting of blood in your stool or on the toilet paper. If you underwent a bowel prep for your procedure, you may not have a normal bowel movement for a few days.  Please Note:  You might notice some irritation and congestion in your nose or some drainage.  This is from the oxygen used during your procedure.  There is no need for concern and it should clear up in a day or so.  SYMPTOMS TO REPORT IMMEDIATELY:   Following lower endoscopy (colonoscopy or flexible sigmoidoscopy):  Excessive amounts of blood in the stool  Significant tenderness or worsening of abdominal pains  Swelling of the abdomen that is new, acute  Fever of 100F or higher    For urgent or emergent issues, a gastroenterologist can be reached at any hour by calling [phone].   DIET:  We do recommend a small meal at first, but then you may proceed to your regular diet.  Drink plenty of fluids but you should avoid alcoholic beverages for 24 hours.  ACTIVITY:  You should plan to take it easy for the rest of today and you should NOT DRIVE or use  heavy machinery until tomorrow (because of the sedation medicines used during the test).    FOLLOW UP: Our staff will call the number listed on your records the next business day following your procedure to check on you and address any questions or concerns that you may have regarding the information given to you following your procedure. If we do not reach you, we will leave a message.  However, if you are feeling well and you are not experiencing any problems, there is no need to return our call.  We will assume that you have returned to your regular daily activities without incident.  If any biopsies were taken you will be contacted by phone or by letter within the next 1-3 weeks.  Please call us at [phone] if you have not heard about the biopsies in 3 weeks.    SIGNATURES/CONFIDENTIALITY: You and/or your care partner have signed paperwork which will be entered into your electronic medical record.  These signatures attest to the fact that that the information above on your After Visit Summary has been reviewed and is understood.  Full responsibility of the confidentiality of this discharge information lies with you and/or your care-partner.

## 2018-01-04 NOTE — Progress Notes (Signed)
Pt's states no medical or surgical changes since previsit or office visit. 

## 2018-01-05 ENCOUNTER — Telehealth: Payer: Self-pay

## 2018-01-05 NOTE — Telephone Encounter (Signed)
Follow up call made, left a voicemail , name identifier. 

## 2018-01-05 NOTE — Telephone Encounter (Signed)
Second post procedure follow up call, no answer 

## 2018-01-08 ENCOUNTER — Encounter: Payer: Self-pay | Admitting: Urology

## 2018-01-08 ENCOUNTER — Ambulatory Visit
Admission: RE | Admit: 2018-01-08 | Discharge: 2018-01-08 | Disposition: A | Discharge status: Home / Self Care | Payer: Medicare Other | Source: Ambulatory Visit | Attending: Urology | Admitting: Urology

## 2018-01-08 ENCOUNTER — Ambulatory Visit (INDEPENDENT_AMBULATORY_CARE_PROVIDER_SITE_OTHER): Payer: Medicare Other | Admitting: Urology

## 2018-01-08 ENCOUNTER — Encounter

## 2018-01-08 VITALS — BP 127/74 | HR 55 | Ht 72.0 in | Wt 236.0 lb

## 2018-01-08 DIAGNOSIS — N201 Calculus of ureter: Secondary | ICD-10-CM

## 2018-01-08 LAB — MICROSCOPIC EXAMINATION
Epithelial Cells (non renal): NONE SEEN /hpf (ref 0–10)
WBC, UA: NONE SEEN /hpf (ref 0–5)

## 2018-01-08 LAB — URINALYSIS, COMPLETE
Bilirubin, UA: NEGATIVE
GLUCOSE, UA: NEGATIVE
KETONES UA: NEGATIVE
Leukocytes, UA: NEGATIVE
Nitrite, UA: NEGATIVE
PROTEIN UA: NEGATIVE
SPEC GRAV UA: 1.025 (ref 1.005–1.030)
Urobilinogen, Ur: 1 mg/dL (ref 0.2–1.0)
pH, UA: 5.5 (ref 5.0–7.5)

## 2018-01-08 MED ORDER — TAMSULOSIN HCL 0.4 MG PO CAPS: 0.4000 mg | ORAL_CAPSULE | Freq: Every day | ORAL | 0 refills | Status: DC

## 2018-01-08 NOTE — Progress Notes (Signed)
01/08/2018 10:02 AM   [name] [name] Feb 25, 1951 [id number]  Referring provider: Laurey Morale, MD [place], [place] [address]  Chief Complaint  Patient presents with  . Nephrolithiasis    [facility] follow up    HPI: 67 year old male presents for hospital follow-up.  He presented to the ED on 11/24/2017 and was admitted for left renal colic with poor pain control.  CT showed a 4 mm left proximal ureteral calculus with mild hydronephrosis and hydroureter.  Shortly after admission his pain significantly improved and he was discharged on tamsulosin and oral analgesics.  He is not aware of passing a stone.  He has occasional dull left flank pain and nausea.  He has not had severe pain.  The stone was visualized on a KUB performed prior to discharge.   PMH: Past Medical History:  Diagnosis Date  . Allergy   . Anxiety   . Arthritis   . Atrial fibrillation [facility])    sees Dr. Lovena Le  . Cataract    forming  . Chronic kidney disease    kidney stone  . Dizziness    or vertigo  . GERD (gastroesophageal reflux disease)   . HLD (hyperlipidemia)   . HTN (hypertension)   . Osteopenia   . Retinal tear   . Sinus bradycardia     Surgical History: Past Surgical History:  Procedure Laterality Date  . CARDIAC CATHETERIZATION  2003   normal  . COLONOSCOPY  06-30-12   per Dr. Fuller Plan, adenomatous  polyps, repeat in 5 years  . POLYPECTOMY    . RETINAL LASER PROCEDURE Right    repair retinal tear   . WART REMOVAL     LEFT UPPER ARM  . WISDOM TOOTH EXTRACTION      Home Medications:  Allergies as of 01/08/2018      Reactions   Sulfa Antibiotics Hives   Paroxetine Other (See Comments)   NOT ABLE TO TOLERATE      Medication List        Accurate as of 01/08/18 10:02 AM. Always use your most recent med list.          aspirin 81 MG tablet Take 1 tablet (81 mg total) by mouth daily.   atenolol 25 MG tablet Commonly known as:  TENORMIN Take 1 tablet (25 mg total) by  mouth 2 (two) times daily.   atorvastatin 40 MG tablet Commonly known as:  LIPITOR Take 1 tablet (40 mg total) by mouth daily.   Calcium Carbonate-Vitamin D 600-400 MG-UNIT tablet Take 1 tablet by mouth daily.   flecainide 100 MG tablet Commonly known as:  TAMBOCOR May take 2 tablets daily as needed for breakthrough a-fib   fluticasone 50 MCG/ACT nasal spray Commonly known as:  FLONASE Place 2 sprays into both nostrils daily.   lisinopril 5 MG tablet Commonly known as:  PRINIVIL,ZESTRIL Take 1 tablet (5 mg total) by mouth daily.   sildenafil 20 MG tablet Commonly known as:  REVATIO Take 5 tabs as needed   tamsulosin 0.4 MG Caps capsule Commonly known as:  FLOMAX Take 1 capsule (0.4 mg total) by mouth daily.   venlafaxine XR 75 MG 24 hr capsule Commonly known as:  EFFEXOR-XR Take 1 capsule (75 mg total) by mouth daily with breakfast.       Allergies:  Allergies  Allergen Reactions  . Sulfa Antibiotics Hives  . Paroxetine Other (See Comments)    NOT ABLE TO TOLERATE    Family History: Family  History  Problem Relation Age of Onset  . Hodgkin's lymphoma Father   . Hyperlipidemia Other   . Hypertension Mother   . Stroke Mother   . Heart attack Brother   . Prostate cancer Brother        1st degree relative <50  . Diabetes Brother        1st degree relative  . Multiple myeloma Brother   . Hypertension Brother   . Heart disease Brother   . Heart attack Brother   . Colon polyps Brother   . Colon cancer Neg Hx   . Esophageal cancer Neg Hx   . Rectal cancer Neg Hx   . Stomach cancer Neg Hx     Social History:  reports that he quit smoking about 42 years ago. He has never used smokeless tobacco. He reports that he drinks alcohol. He reports that he does not use drugs.  ROS: UROLOGY Frequent Urination?: No Hard to postpone urination?: No Burning/pain with urination?: No Get up at night to urinate?: No Leakage of urine?: No Urine stream starts and stops?:  No Trouble starting stream?: No Do you have to strain to urinate?: No Blood in urine?: No Urinary tract infection?: No Sexually transmitted disease?: No Injury to kidneys or bladder?: No Painful intercourse?: No Weak stream?: No Erection problems?: No Penile pain?: No  Gastrointestinal Nausea?: No Vomiting?: No Indigestion/heartburn?: No Diarrhea?: No Constipation?: No  Constitutional Fever: No Night sweats?: No Weight loss?: No Fatigue?: No  Skin Skin rash/lesions?: No Itching?: No  Eyes Blurred vision?: No Double vision?: No  Ears/Nose/Throat Sore throat?: No Sinus problems?: No  Hematologic/Lymphatic Swollen glands?: No Easy bruising?: No  Cardiovascular Leg swelling?: No Chest pain?: No  Respiratory Cough?: No Shortness of breath?: No  Endocrine Excessive thirst?: No  Musculoskeletal Back pain?: Yes Joint pain?: No  Neurological Headaches?: No Dizziness?: No  Psychologic Depression?: No Anxiety?: No  Physical Exam: BP 127/74   Pulse (!) 55   Ht 6' (1.829 m)   Wt 236 lb (107 kg)   BMI 32.01 kg/m   Constitutional:  Alert and oriented, No acute distress. HEENT: [place] AT, moist mucus membranes.  Trachea midline, no masses. Cardiovascular: No clubbing, cyanosis, or edema.  RRR Respiratory: Normal respiratory effort, no increased work of breathing.  Clear GI: Abdomen is soft, nontender, nondistended, no abdominal masses GU: No CVA tenderness Lymph: No cervical or inguinal lymphadenopathy. Skin: No rashes, bruises or suspicious lesions. Neurologic: Grossly intact, no focal deficits, moving all 4 extremities. Psychiatric: Normal mood and affect.  Assessment & Plan:   67 year old male with a 4 mm left proximal ureteral calculus.  He is not aware of passing a stone and has intermittent mild discomfort.  Will obtain a repeat KUB to see if there has been any distal stone progression.  He is out of tamsulosin which was refilled.  If there has  been no significant progression management options were discussed including shockwave lithotripsy and ureteroscopy.  The pros and cons of each procedure were discussed and he will think over these options but would be leaning more towards shockwave lithotripsy.   Abbie Sons, [place] [address], [place], [place] [address] [phone]

## 2018-01-11 ENCOUNTER — Telehealth: Payer: Self-pay

## 2018-01-11 NOTE — Telephone Encounter (Signed)
Pt would like to discuss scheduling lithotripsy can you call him and discuss this with him? He has questions about scheduling.

## 2018-01-11 NOTE — Telephone Encounter (Signed)
-----   Message from Abbie Sons, MD sent at 01/10/2018 10:18 AM EST ----- KUB was reviewed.  His stone has progressed to the lower ureter.  Could continue a trial of passage.  He will also be a candidate for ureteroscopy and shockwave lithotripsy if he desired to proceed with treatment.  If he elects trial of passage would recommend a follow-up KUB 2 weeks

## 2018-01-13 ENCOUNTER — Other Ambulatory Visit: Payer: Self-pay | Admitting: Radiology

## 2018-01-13 DIAGNOSIS — N201 Calculus of ureter: Secondary | ICD-10-CM

## 2018-01-13 NOTE — Telephone Encounter (Signed)
Notified patient of lithotripsy scheduled 01/21/2018 at 8:15. Patient should arrive at that time to [facility] Day Surgery. Pre-op instructions for lithotripsy were provided to patient.    Advised patient to hold Aspirin 81mg  for 3 days prior to the procedure beginning on 01/18/2018.  Patient's questions were answered & he expresses understanding of these instructions.   [name] [name]

## 2018-01-14 ENCOUNTER — Other Ambulatory Visit: Payer: Self-pay | Admitting: Radiology

## 2018-01-14 DIAGNOSIS — N201 Calculus of ureter: Secondary | ICD-10-CM

## 2018-01-15 ENCOUNTER — Encounter: Payer: Self-pay | Admitting: Gastroenterology

## 2018-01-20 ENCOUNTER — Other Ambulatory Visit: Payer: Self-pay | Admitting: Radiology

## 2018-01-20 ENCOUNTER — Encounter
Admission: RE | Admit: 2018-01-20 | Discharge: 2018-01-20 | Disposition: A | Discharge status: Home / Self Care | Payer: Medicare Other | Source: Ambulatory Visit | Attending: Urology | Admitting: Urology

## 2018-01-20 DIAGNOSIS — I10 Essential (primary) hypertension: Secondary | ICD-10-CM | POA: Diagnosis not present

## 2018-01-20 DIAGNOSIS — R001 Bradycardia, unspecified: Secondary | ICD-10-CM | POA: Insufficient documentation

## 2018-01-20 DIAGNOSIS — N201 Calculus of ureter: Secondary | ICD-10-CM | POA: Diagnosis not present

## 2018-01-20 HISTORY — DX: Personal history of urinary calculi: Z87.442

## 2018-01-21 ENCOUNTER — Ambulatory Visit: Payer: Medicare Other

## 2018-01-21 ENCOUNTER — Other Ambulatory Visit: Payer: Self-pay

## 2018-01-21 ENCOUNTER — Encounter: Payer: Self-pay | Admitting: *Deleted

## 2018-01-21 ENCOUNTER — Encounter
Admission: RE | Disposition: A | Discharge status: Home / Self Care | Payer: Self-pay | Source: Ambulatory Visit | Attending: Urology

## 2018-01-21 ENCOUNTER — Ambulatory Visit
Admission: RE | Admit: 2018-01-21 | Discharge: 2018-01-21 | Disposition: A | Discharge status: Home / Self Care | Payer: Medicare Other | Source: Ambulatory Visit | Attending: Urology | Admitting: Urology

## 2018-01-21 DIAGNOSIS — N201 Calculus of ureter: Secondary | ICD-10-CM | POA: Insufficient documentation

## 2018-01-21 DIAGNOSIS — N200 Calculus of kidney: Secondary | ICD-10-CM | POA: Diagnosis not present

## 2018-01-21 DIAGNOSIS — I10 Essential (primary) hypertension: Secondary | ICD-10-CM | POA: Insufficient documentation

## 2018-01-21 HISTORY — PX: EXTRACORPOREAL SHOCK WAVE LITHOTRIPSY: SHX1557

## 2018-01-21 SURGERY — LITHOTRIPSY, ESWL
Anesthesia: Moderate Sedation | Laterality: Left

## 2018-01-21 MED ORDER — HYDROCODONE-ACETAMINOPHEN 5-325 MG PO TABS: 1.0000 | ORAL_TABLET | Freq: Four times a day (QID) | ORAL | 0 refills | Status: DC | PRN

## 2018-01-21 MED ORDER — DOCUSATE SODIUM 100 MG PO CAPS: 100.0000 mg | ORAL_CAPSULE | Freq: Two times a day (BID) | ORAL | 0 refills | Status: DC

## 2018-01-21 MED ORDER — DIAZEPAM 5 MG PO TABS
ORAL_TABLET | ORAL | Status: AC
  Administered 2018-01-21: 10 mg via ORAL
  Filled 2018-01-21: qty 2

## 2018-01-21 MED ORDER — ONDANSETRON HCL 4 MG/2ML IJ SOLN
4.0000 mg | Freq: Once | INTRAMUSCULAR | Status: AC
  Administered 2018-01-21: 4 mg via INTRAVENOUS

## 2018-01-21 MED ORDER — ONDANSETRON HCL 2 MG/ML IV SOLN: 4.0000 mg | Freq: Once | INTRAVENOUS | Status: DC

## 2018-01-21 MED ORDER — DIAZEPAM 5 MG PO TABS
10.0000 mg | ORAL_TABLET | ORAL | Status: AC
  Administered 2018-01-21: 10 mg via ORAL

## 2018-01-21 MED ORDER — DIPHENHYDRAMINE HCL 25 MG PO CAPS
25.0000 mg | ORAL_CAPSULE | ORAL | Status: AC
  Administered 2018-01-21: 25 mg via ORAL

## 2018-01-21 MED ORDER — CIPROFLOXACIN HCL 500 MG PO TABS
ORAL_TABLET | ORAL | Status: AC
  Administered 2018-01-21: 500 mg via ORAL
  Filled 2018-01-21: qty 1

## 2018-01-21 MED ORDER — DIPHENHYDRAMINE HCL 25 MG PO CAPS
ORAL_CAPSULE | ORAL | Status: AC
  Administered 2018-01-21: 25 mg via ORAL
  Filled 2018-01-21: qty 1

## 2018-01-21 MED ORDER — SODIUM CHLORIDE 0.9 % IV SOLN
INTRAVENOUS | Status: DC
  Administered 2018-01-21: 09:00:00 via INTRAVENOUS

## 2018-01-21 MED ORDER — ONDANSETRON HCL 4 MG/2ML IJ SOLN
INTRAMUSCULAR | Status: AC
  Administered 2018-01-21: 4 mg via INTRAVENOUS
  Filled 2018-01-21: qty 2

## 2018-01-21 MED ORDER — CIPROFLOXACIN HCL 500 MG PO TABS
500.0000 mg | ORAL_TABLET | ORAL | Status: AC
  Administered 2018-01-21: 500 mg via ORAL

## 2018-01-21 NOTE — Discharge Instructions (Signed)

## 2018-01-21 NOTE — H&P (Signed)
H&P updated on [place] stone paper.  See scanned documents.

## 2018-01-22 ENCOUNTER — Telehealth: Payer: Self-pay | Admitting: Family Medicine

## 2018-01-22 ENCOUNTER — Other Ambulatory Visit: Payer: Self-pay

## 2018-01-22 ENCOUNTER — Encounter: Payer: Self-pay | Admitting: Emergency Medicine

## 2018-01-22 ENCOUNTER — Emergency Department: Payer: Medicare Other

## 2018-01-22 ENCOUNTER — Emergency Department
Admission: EM | Admit: 2018-01-22 | Discharge: 2018-01-22 | Disposition: A | Discharge status: Home / Self Care | Payer: Medicare Other | Attending: Emergency Medicine | Admitting: Emergency Medicine

## 2018-01-22 DIAGNOSIS — Z79899 Other long term (current) drug therapy: Secondary | ICD-10-CM | POA: Diagnosis not present

## 2018-01-22 DIAGNOSIS — Z7982 Long term (current) use of aspirin: Secondary | ICD-10-CM | POA: Insufficient documentation

## 2018-01-22 DIAGNOSIS — R1032 Left lower quadrant pain: Secondary | ICD-10-CM | POA: Diagnosis present

## 2018-01-22 DIAGNOSIS — N23 Unspecified renal colic: Secondary | ICD-10-CM

## 2018-01-22 DIAGNOSIS — N200 Calculus of kidney: Secondary | ICD-10-CM | POA: Diagnosis not present

## 2018-01-22 DIAGNOSIS — E119 Type 2 diabetes mellitus without complications: Secondary | ICD-10-CM | POA: Insufficient documentation

## 2018-01-22 DIAGNOSIS — G8918 Other acute postprocedural pain: Secondary | ICD-10-CM | POA: Diagnosis not present

## 2018-01-22 DIAGNOSIS — R1110 Vomiting, unspecified: Secondary | ICD-10-CM | POA: Diagnosis not present

## 2018-01-22 DIAGNOSIS — N132 Hydronephrosis with renal and ureteral calculous obstruction: Secondary | ICD-10-CM | POA: Diagnosis not present

## 2018-01-22 DIAGNOSIS — I10 Essential (primary) hypertension: Secondary | ICD-10-CM | POA: Insufficient documentation

## 2018-01-22 LAB — COMPREHENSIVE METABOLIC PANEL
ALBUMIN: 3.9 g/dL (ref 3.5–5.0)
ALK PHOS: 71 U/L (ref 38–126)
ALT: 20 U/L (ref 0–44)
ANION GAP: 6 (ref 5–15)
AST: 23 U/L (ref 15–41)
BILIRUBIN TOTAL: 0.8 mg/dL (ref 0.3–1.2)
BUN: 17 mg/dL (ref 8–23)
CALCIUM: 8.3 mg/dL — AB (ref 8.9–10.3)
CO2: 29 mmol/L (ref 22–32)
CREATININE: 1.05 mg/dL (ref 0.61–1.24)
Chloride: 102 mmol/L (ref 98–111)
GFR calc Af Amer: >60 mL/min (ref >60)
GFR calc non Af Amer: >60 mL/min (ref >60)
GLUCOSE: 148 mg/dL — AB (ref 70–99)
Potassium: 4.4 mmol/L (ref 3.5–5.1)
SODIUM: 137 mmol/L (ref 135–145)
TOTAL PROTEIN: 6.8 g/dL (ref 6.5–8.1)

## 2018-01-22 LAB — URINALYSIS, COMPLETE (UACMP) WITH MICROSCOPIC
BILIRUBIN URINE: NEGATIVE
Bacteria, UA: NONE SEEN
Glucose, UA: NEGATIVE mg/dL
Ketones, ur: NEGATIVE mg/dL
LEUKOCYTES UA: NEGATIVE
Nitrite: NEGATIVE
PH: 6 (ref 5.0–8.0)
Protein, ur: NEGATIVE mg/dL
RBC / HPF: >50 RBC/hpf — ABNORMAL HIGH (ref 0–5)
Specific Gravity, Urine: 1.02 (ref 1.005–1.030)

## 2018-01-22 LAB — CBC WITH DIFFERENTIAL/PLATELET
Abs Immature Granulocytes: 0.05 10*3/uL (ref 0.00–0.07)
BASOS PCT: 1 %
Basophils Absolute: 0.1 10*3/uL (ref 0.0–0.1)
EOS ABS: 0.3 10*3/uL (ref 0.0–0.5)
EOS PCT: 3 %
HEMATOCRIT: 44.9 % (ref 39.0–52.0)
Hemoglobin: 14.7 g/dL (ref 13.0–17.0)
IMMATURE GRANULOCYTES: 1 %
LYMPHS ABS: 1.1 10*3/uL (ref 0.7–4.0)
Lymphocytes Relative: 11 %
MCH: 28.5 pg (ref 26.0–34.0)
MCHC: 32.7 g/dL (ref 30.0–36.0)
MCV: 87.2 fL (ref 80.0–100.0)
MONOS PCT: 7 %
Monocytes Absolute: 0.7 10*3/uL (ref 0.1–1.0)
Neutro Abs: 8.2 10*3/uL — ABNORMAL HIGH (ref 1.7–7.7)
Neutrophils Relative %: 77 %
PLATELETS: 184 10*3/uL (ref 150–400)
RBC: 5.15 MIL/uL (ref 4.22–5.81)
RDW: 12.9 % (ref 11.5–15.5)
WBC: 10.4 10*3/uL (ref 4.0–10.5)
nRBC: 0 % (ref 0.0–0.2)

## 2018-01-22 MED ORDER — ONDANSETRON HCL 4 MG/2ML IJ SOLN
4.0000 mg | Freq: Once | INTRAMUSCULAR | Status: AC
  Administered 2018-01-22: 4 mg via INTRAVENOUS
  Filled 2018-01-22: qty 2

## 2018-01-22 MED ORDER — SODIUM CHLORIDE 0.9 % IV BOLUS
1000.0000 mL | Freq: Once | INTRAVENOUS | Status: AC
  Administered 2018-01-22: 1000 mL via INTRAVENOUS

## 2018-01-22 MED ORDER — ONDANSETRON 4 MG PO TBDP: 4.0000 mg | ORAL_TABLET | Freq: Three times a day (TID) | ORAL | 0 refills | Status: DC | PRN

## 2018-01-22 MED ORDER — IBUPROFEN 600 MG PO TABS: 600.0000 mg | ORAL_TABLET | Freq: Three times a day (TID) | ORAL | 0 refills | Status: DC | PRN

## 2018-01-22 MED ORDER — HYDROCODONE-ACETAMINOPHEN 5-325 MG PO TABS: 1.0000 | ORAL_TABLET | Freq: Four times a day (QID) | ORAL | 0 refills | Status: DC | PRN

## 2018-01-22 MED ORDER — LIDOCAINE HCL (CARDIAC) PF 100 MG/5ML IV SOSY
1.5000 mg/kg | PREFILLED_SYRINGE | Freq: Once | INTRAVENOUS | Status: AC
  Administered 2018-01-22: 160.6 mg via INTRAVENOUS
  Filled 2018-01-22: qty 10

## 2018-01-22 MED ORDER — KETOROLAC TROMETHAMINE 30 MG/ML IJ SOLN
15.0000 mg | Freq: Once | INTRAMUSCULAR | Status: AC
  Administered 2018-01-22: 15 mg via INTRAVENOUS
  Filled 2018-01-22: qty 1

## 2018-01-22 MED ORDER — MORPHINE SULFATE (PF) 10 MG/ML IV SOLN
10.0000 mg | Freq: Once | INTRAVENOUS | Status: AC
  Administered 2018-01-22: 10 mg via INTRAVENOUS
  Filled 2018-01-22: qty 1

## 2018-01-22 MED ORDER — FUROSEMIDE 10 MG/ML IJ SOLN
10.0000 mg | Freq: Once | INTRAMUSCULAR | Status: DC
  Filled 2018-01-22: qty 4

## 2018-01-22 MED ORDER — FENTANYL CITRATE (PF) 100 MCG/2ML IJ SOLN
100.0000 ug | Freq: Once | INTRAMUSCULAR | Status: AC
  Administered 2018-01-22: 100 ug via INTRAVENOUS
  Filled 2018-01-22: qty 2

## 2018-01-22 NOTE — Discharge Instructions (Addendum)
Today your CT scan showed that you have persistent stones on your left side although as we discussed this can be completely normal and I fully expect you to continue to pass the stones.  He had no evidence of infection.  I spoke with the on-call urologist Dr. Diamantina Providence who recommended that you call the clinic later on today to discuss the case with Dr. Erlene Quan and see if she would like to see you any sooner.  Please return to the emergency department immediately for any new or worsening symptoms such as worsening pain, fevers, chills, if you cannot eat or drink, or for any other issues whatsoever.  It was a pleasure to take care of you today, and thank you for coming to our emergency department.  If you have any questions or concerns before leaving please ask the nurse to grab me and I'm more than happy to go through your aftercare instructions again.  If you were prescribed any opioid pain medication today such as Norco, Vicodin, Percocet, morphine, hydrocodone, or oxycodone please make sure you do not drive when you are taking this medication as it can alter your ability to drive safely.  If you have any concerns once you are home that you are not improving or are in fact getting worse before you can make it to your follow-up appointment, please do not hesitate to call 911 and come back for further evaluation.  Darel Hong, MD  Results for orders placed or performed during the hospital encounter of 01/22/18  Urinalysis, Complete w Microscopic  Result Value Ref Range   Color, Urine YELLOW (A) YELLOW   APPearance CLEAR (A) CLEAR   Specific Gravity, Urine 1.020 1.005 - 1.030   pH 6.0 5.0 - 8.0   Glucose, UA NEGATIVE NEGATIVE mg/dL   Hgb urine dipstick LARGE (A) NEGATIVE   Bilirubin Urine NEGATIVE NEGATIVE   Ketones, ur NEGATIVE NEGATIVE mg/dL   Protein, ur NEGATIVE NEGATIVE mg/dL   Nitrite NEGATIVE NEGATIVE   Leukocytes, UA NEGATIVE NEGATIVE   RBC / HPF >50 (H) 0 - 5 RBC/hpf   WBC, UA 0-5 0  - 5 WBC/hpf   Bacteria, UA NONE SEEN NONE SEEN   Squamous Epithelial / LPF 0-5 0 - 5   Mucus PRESENT   Comprehensive metabolic panel  Result Value Ref Range   Sodium 137 135 - 145 mmol/L   Potassium 4.4 3.5 - 5.1 mmol/L   Chloride 102 98 - 111 mmol/L   CO2 29 22 - 32 mmol/L   Glucose, Bld 148 (H) 70 - 99 mg/dL   BUN 17 8 - 23 mg/dL   Creatinine, Ser 1.05 0.61 - 1.24 mg/dL   Calcium 8.3 (L) 8.9 - 10.3 mg/dL   Total Protein 6.8 6.5 - 8.1 g/dL   Albumin 3.9 3.5 - 5.0 g/dL   AST 23 15 - 41 U/L   ALT 20 0 - 44 U/L   Alkaline Phosphatase 71 38 - 126 U/L   Total Bilirubin 0.8 0.3 - 1.2 mg/dL   GFR calc non Af Amer >60 >60 mL/min   GFR calc Af Amer >60 >60 mL/min   Anion gap 6 5 - 15  CBC with Differential  Result Value Ref Range   WBC 10.4 4.0 - 10.5 K/uL   RBC 5.15 4.22 - 5.81 MIL/uL   Hemoglobin 14.7 13.0 - 17.0 g/dL   HCT 44.9 39.0 - 52.0 %   MCV 87.2 80.0 - 100.0 fL   MCH 28.5 26.0 - 34.0  pg   MCHC 32.7 30.0 - 36.0 g/dL   RDW 12.9 11.5 - 15.5 %   Platelets 184 150 - 400 K/uL   nRBC 0.0 0.0 - 0.2 %   Neutrophils Relative % 77 %   Neutro Abs 8.2 (H) 1.7 - 7.7 K/uL   Lymphocytes Relative 11 %   Lymphs Abs 1.1 0.7 - 4.0 K/uL   Monocytes Relative 7 %   Monocytes Absolute 0.7 0.1 - 1.0 K/uL   Eosinophils Relative 3 %   Eosinophils Absolute 0.3 0.0 - 0.5 K/uL   Basophils Relative 1 %   Basophils Absolute 0.1 0.0 - 0.1 K/uL   Immature Granulocytes 1 %   Abs Immature Granulocytes 0.05 0.00 - 0.07 K/uL   Dg Abd 1 View  Result Date: 01/21/2018 CLINICAL DATA:  Morning of procedure-pre lithotripsy left ureteral calculus EXAM: ABDOMEN - 1 VIEW COMPARISON:  01/08/2018 FINDINGS: Bowel gas pattern is nonobstructed. No new calcifications identified in the expected location of the kidneys. Within the pelvis, there are numerous prostatic and vascular calcifications. 4 millimeter calculus in the LEFT hemipelvis is stable since prior and could represent a distal ureteral stone. Otherwise  the courses of the ureters are unremarkable. IMPRESSION: Possible LEFT ureterovesical junction stone measuring 4 millimeters. Electronically Signed   By: Nolon Nations M.D.   On: 01/21/2018 09:51   Abdomen 1 View (kub)  Result Date: 01/08/2018 CLINICAL DATA:  History of left ureteral stone EXAM: ABDOMEN - 1 VIEW COMPARISON:  11/24/2017 FINDINGS: There is a small calcification in the left side of the pelvis which may reflect the previously seen mid left ureteral stone. No calcification is seen in the area of previously noted adjacent to the L3-4 disc space. No other suspicious calcification. Nonobstructive bowel gas pattern. No free air or organomegaly. IMPRESSION: Small calcification in the left side of the pelvis may reflect distal migration of the previously seen mid left ureteral stone which would now likely be near the left UVJ. Electronically Signed   By: Rolm Baptise M.D.   On: 01/08/2018 11:22   Ct Renal Stone Study  Result Date: 01/22/2018 CLINICAL DATA:  Acute onset of left lower quadrant abdominal pain and vomiting. EXAM: CT ABDOMEN AND PELVIS WITHOUT CONTRAST TECHNIQUE: Multidetector CT imaging of the abdomen and pelvis was performed following the standard protocol without IV contrast. COMPARISON:  CT of the abdomen and pelvis performed 11/24/2017 FINDINGS: Lower chest: Minimal bibasilar atelectasis is noted. Mild coronary artery calcification is seen. Hepatobiliary: The liver is unremarkable in appearance. The gallbladder is unremarkable in appearance. The common bile duct remains normal in caliber. Pancreas: The pancreas is within normal limits. Spleen: The spleen is unremarkable in appearance. Adrenals/Urinary Tract: The adrenal glands are unremarkable in appearance. There is mild left-sided hydronephrosis, with left-sided perinephric stranding and fluid. This reflects an obstructing 4 mm stone at the distal left ureter, just above the left vesicoureteral junction. A 2 mm nonobstructing  stone is noted at the interpole region of the left kidney. The right kidney is unremarkable in appearance. Stomach/Bowel: The stomach is unremarkable in appearance. The small bowel is within normal limits. The appendix is normal in caliber, without evidence of appendicitis. Mild diverticulosis is noted along the sigmoid colon, without evidence of diverticulitis. Vascular/Lymphatic: Scattered calcification is seen along the abdominal aorta and its branches. The abdominal aorta is otherwise grossly unremarkable. The inferior vena cava is grossly unremarkable. No retroperitoneal lymphadenopathy is seen. No pelvic sidewall lymphadenopathy is identified. Reproductive: The bladder is largely  decompressed. Mild soft tissue inflammation about the bladder could reflect cystitis. The prostate remains normal in size, with scattered calcification. Other: No additional soft tissue abnormalities are seen. Musculoskeletal: No acute osseous abnormalities are identified. The visualized musculature is unremarkable in appearance. IMPRESSION: 1. Mild left-sided hydronephrosis, reflecting an obstructing 4 mm stone at the distal left ureter, just above the left vesicoureteral junction. 2. 2 mm nonobstructing stone at the interpole region of the left kidney. 3. Mild soft tissue inflammation about the bladder could reflect cystitis. 4. Mild diverticulosis along the sigmoid colon, without evidence of diverticulitis. 5. Mild coronary artery calcification. Aortic Atherosclerosis (ICD10-I70.0). Electronically Signed   By: Garald Balding M.D.   On: 01/22/2018 05:03   While here in the ER today you received very powerful medicine that makes it unsafe for you to drive for the rest of the day.  Do not drive until tomorrow.

## 2018-01-22 NOTE — Telephone Encounter (Signed)
Patient called stating he went to ED over night because he was in so much pain post Litho. He was given pain medication and Zofran to take for nausea. He is wanting to know if he should come in to be seen sooner than December. Please advise.

## 2018-01-22 NOTE — Telephone Encounter (Signed)
The stone is small and now has made it all the way down to the edge of the bladder.  Lets to see if he passes it on his own with Flomax if his pain is reasonably controlled.  If his pain is severe, he can see Dr. Bernardo Heater sooner to discuss options since he is Stoioff's patient.  Hollice Espy, MD

## 2018-01-22 NOTE — ED Triage Notes (Signed)
Patient to ER for c/o abd pain to left lower abd. Patient states he had lithotripsy done at approx 0930 on 01/21/18. Patient reports emesis just prior to arrival.

## 2018-01-22 NOTE — ED Provider Notes (Signed)
[facility] Emergency Department Provider Note  ____________________________________________   First MD Initiated Contact with Patient 01/22/18 0423     (approximate)  I have reviewed the triage vital signs and the nursing notes.   HISTORY  Chief Complaint Post-op Problem   HPI [name] [name] is a 67 y.o. male who comes to the emergency department with sudden onset severe left lower quadrant and left flank abdominal pain associated with nausea that began about 2 hours prior to arrival.  He has had a left-sided kidney stone for the past 2 months and at 9:30 AM today he had lithotripsy performed.  He initially did well following the procedure however his pain were heard suddenly shortly prior to arrival.  It is now severe radiating from his flank towards his groin and associated with nausea.  No fevers or chills.  Nothing seems to make the pain better or worse.    Past Medical History:  Diagnosis Date  . Allergy   . Anxiety   . Arthritis   . Atrial fibrillation [facility])    sees Dr. Lovena Le  . Cataract    forming  . Chronic kidney disease    kidney stone  . Dizziness    or vertigo  . GERD (gastroesophageal reflux disease)   . HLD (hyperlipidemia)   . HTN (hypertension)   . Osteopenia   . Retinal tear   . Sinus bradycardia     Patient Active Problem List   Diagnosis Date Noted  . Left ureteral stone 11/24/2017  . Diabetes mellitus without complication ([place]) 97/35/3299  . ERECTILE DYSFUNCTION, NON-ORGANIC, MILD 11/20/2008  . ATRIAL FIBRILLATION 06/02/2008  . SINUS BRADYCARDIA 06/02/2008  . OSTEOPENIA 06/02/2008  . GERD 04/19/2008  . DIZZINESS OR VERTIGO 04/19/2008  . Hyperlipemia, mixed 10/05/2006  . Anxiety state 10/05/2006  . Essential hypertension 10/05/2006    Past Surgical History:  Procedure Laterality Date  . CARDIAC CATHETERIZATION  2003   normal  . COLONOSCOPY  06-30-12   per Dr. Fuller Plan, adenomatous  polyps, repeat in 5 years  .  EXTRACORPOREAL SHOCK WAVE LITHOTRIPSY Left 01/21/2018   Procedure: EXTRACORPOREAL SHOCK WAVE LITHOTRIPSY (ESWL);  Surgeon: Hollice Espy, MD;  Location: [facility] ORS;  Service: Urology;  Laterality: Left;  . POLYPECTOMY    . RETINAL LASER PROCEDURE Right    repair retinal tear   . WART REMOVAL     LEFT UPPER ARM  . WISDOM TOOTH EXTRACTION      Prior to Admission medications   Medication Sig Start Date End Date Taking? Authorizing Provider  aspirin 81 MG tablet Take 1 tablet (81 mg total) by mouth daily. 08/15/16   Laurey Morale, MD  atenolol (TENORMIN) 25 MG tablet Take 1 tablet (25 mg total) by mouth 2 (two) times daily. 08/19/17   Laurey Morale, MD  atorvastatin (LIPITOR) 40 MG tablet Take 1 tablet (40 mg total) by mouth daily. 08/19/17   Laurey Morale, MD  Calcium Carbonate-Vitamin D 600-400 MG-UNIT per tablet Take 1 tablet by mouth daily.      [provider]  docusate sodium (COLACE) 100 MG capsule Take 1 capsule (100 mg total) by mouth 2 (two) times daily. 01/21/18   Hollice Espy, MD  flecainide [facility]) 100 MG tablet May take 2 tablets daily as needed for breakthrough a-fib Patient not taking: Reported on 01/21/2018 05/13/16   Evans Lance, MD  fluticasone [facility]) 50 MCG/ACT nasal spray Place 2 sprays into both nostrils daily.  [provider]  HYDROcodone-acetaminophen (NORCO) 5-325 MG tablet Take 1 tablet by mouth every 6 (six) hours as needed for up to 15 doses for severe pain. 01/22/18   Darel Hong, MD  ibuprofen (ADVIL,MOTRIN) 600 MG tablet Take 1 tablet (600 mg total) by mouth every 8 (eight) hours as needed. 01/22/18   Darel Hong, MD  lisinopril (PRINIVIL,ZESTRIL) 5 MG tablet Take 1 tablet (5 mg total) by mouth daily. 08/19/17   Laurey Morale, MD  ondansetron (ZOFRAN ODT) 4 MG disintegrating tablet Take 1 tablet (4 mg total) by mouth every 8 (eight) hours as needed for nausea or vomiting. 01/22/18   Darel Hong, MD  sildenafil (REVATIO) 20  MG tablet Take 5 tabs as needed Patient not taking: Reported on 01/21/2018 08/19/17   Laurey Morale, MD  tamsulosin (FLOMAX) 0.4 MG CAPS capsule Take 1 capsule (0.4 mg total) by mouth daily. 01/08/18   Abbie Sons, MD  venlafaxine XR (EFFEXOR-XR) 75 MG 24 hr capsule Take 1 capsule (75 mg total) by mouth daily with breakfast. 08/19/17   Laurey Morale, MD    Allergies Sulfa antibiotics and Paroxetine  Family History  Problem Relation Age of Onset  . Hodgkin's lymphoma Father   . Hyperlipidemia Other   . Hypertension Mother   . Stroke Mother   . Heart attack Brother   . Prostate cancer Brother        1st degree relative <50  . Diabetes Brother        1st degree relative  . Multiple myeloma Brother   . Hypertension Brother   . Heart disease Brother   . Heart attack Brother   . Colon polyps Brother   . Colon cancer Neg Hx   . Esophageal cancer Neg Hx   . Rectal cancer Neg Hx   . Stomach cancer Neg Hx     Social History Social History   Tobacco Use  . Smoking status: Former Smoker    Last attempt to quit: 03/11/1975    Years since quitting: 42.9  . Smokeless tobacco: Never Used  Substance Use Topics  . Alcohol use: Yes    Alcohol/week: 0.0 standard drinks    Comment: couple times a month-SOCIALLY 1 BEER A MONTH  . Drug use: No    Review of Systems Constitutional: No fever/chills Eyes: No visual changes. ENT: No sore throat. Cardiovascular: Denies chest pain. Respiratory: Denies shortness of breath. Gastrointestinal: Positive for abdominal pain.  Positive for nausea, no vomiting.  No diarrhea.  No constipation. Genitourinary: Negative for dysuria. Musculoskeletal: Positive for back pain. Skin: Negative for rash. Neurological: Negative for headaches, focal weakness or numbness.   ____________________________________________   PHYSICAL EXAM:  VITAL SIGNS: ED Triage Vitals  Enc Vitals Group     BP 01/22/18 0416 (!) 163/83     Pulse Rate 01/22/18 0416 (!) 59      Resp 01/22/18 0416 18     Temp 01/22/18 0416 97.6 F (36.4 C)     Temp Source 01/22/18 0416 Oral     SpO2 01/22/18 0416 99 %     Weight 01/22/18 0422 236 lb 1.8 oz (107.1 kg)     Height 01/22/18 0422 6' (1.829 m)     Head Circumference --      Peak Flow --      Pain Score 01/22/18 0421 7     Pain Loc --      Pain Edu? --      Excl. in [place]? --  Constitutional: Alert and oriented x4 tearful and miserable appearing Eyes: PERRL EOMI. Head: Atraumatic. Nose: No congestion/rhinnorhea. Mouth/Throat: No trismus Neck: No stridor.   Cardiovascular: Normal rate, regular rhythm. Grossly normal heart sounds.  Good peripheral circulation. Respiratory: Normal respiratory effort.  No retractions. Lungs CTAB and moving good air Gastrointestinal: Soft nontender no peritonitis no costovertebral tenderness Musculoskeletal: No lower extremity edema   Neurologic:  Normal speech and language. No gross focal neurologic deficits are appreciated. Skin:  Skin is warm, dry and intact. No rash noted. Psychiatric: Mood and affect are normal. Speech and behavior are normal.    ____________________________________________   DIFFERENTIAL includes but not limited to  Pyelonephritis, nephrolithiasis, infected stone, renal failure ____________________________________________   LABS (all labs ordered are listed, but only abnormal results are displayed)  Labs Reviewed  URINALYSIS, COMPLETE (UACMP) WITH MICROSCOPIC - Abnormal; Notable for the following components:      Result Value   Color, Urine YELLOW (*)    APPearance CLEAR (*)    Hgb urine dipstick LARGE (*)    RBC / HPF >50 (*)    All other components within normal limits  COMPREHENSIVE METABOLIC PANEL - Abnormal; Notable for the following components:   Glucose, Bld 148 (*)    Calcium 8.3 (*)    All other components within normal limits  CBC WITH DIFFERENTIAL/PLATELET - Abnormal; Notable for the following components:   Neutro Abs 8.2 (*)      All other components within normal limits    Lab work reviewed by me with no evidence of acute kidney injury no evidence of infection __________________________________________  EKG   ____________________________________________  RADIOLOGY  CT stone reviewed by me with 4 mm stone at the left UVJ ____________________________________________   PROCEDURES  Procedure(s) performed: no  Procedures  Critical Care performed: no  ____________________________________________   INITIAL IMPRESSION / ASSESSMENT AND PLAN / ED COURSE  Pertinent labs & imaging results that were available during my care of the patient were reviewed by me and considered in my medical decision making (see chart for details).   As part of my medical decision making, I reviewed the following data within the [place] History obtained from family if available, nursing notes, old chart and ekg, as well as notes from prior ED visits.  The patient comes to the emergency department with symptoms consistent with recurrent renal colic after lithotripsy today.  Given the recurrent symptoms that of actually worsened following lithotripsy I will obtain a CT scan lab work and urinalysis once again.  Will start with 15 mg of Toradol and 100 mcg of fentanyl IV for pain and then reevaluate.   The patient's CT scan shows 4 mm left-sided UVJ stone.  I discussed with on-call urologist who indicated that is not at all unusual for her to take several days for the stones to pass following lithotripsy and that even though it appears to be one stone on CT it is not actually one stone.  He felt that if were able to control the patient's pain he can be followed as an outpatient with Flomax and pain control.  Patient's pain was nearly completely resolved following fentanyl however it is now recurring.  Given 10 mg of IV morphine, 4 mg of IV Zofran, and 1.5 mg/kg of IV lidocaine with near complete resolution of his  symptoms.  I will discharge home with hydrocodone, ibuprofen, Zofran, and refer him back to urology.  Strict return precautions have been given. ____________________________________________  FINAL CLINICAL IMPRESSION(S) / ED DIAGNOSES  Final diagnoses:  Renal colic      NEW MEDICATIONS STARTED DURING THIS VISIT:  Discharge Medication List as of 01/22/2018  6:55 AM    START taking these medications   Details  ibuprofen (ADVIL,MOTRIN) 600 MG tablet Take 1 tablet (600 mg total) by mouth every 8 (eight) hours as needed., Starting Fri 01/22/2018, Print    ondansetron (ZOFRAN ODT) 4 MG disintegrating tablet Take 1 tablet (4 mg total) by mouth every 8 (eight) hours as needed for nausea or vomiting., Starting Fri 01/22/2018, Print         Note:  This document was prepared using Dragon voice recognition software and may include unintentional dictation errors.     Darel Hong, MD 01/24/18 [id number]

## 2018-01-22 NOTE — Telephone Encounter (Signed)
Patient notified and voiced understanding. He states he will call next week if he is not improving.

## 2018-01-25 ENCOUNTER — Other Ambulatory Visit: Payer: Self-pay

## 2018-01-27 ENCOUNTER — Emergency Department
Admission: EM | Admit: 2018-01-27 | Discharge: 2018-01-27 | Disposition: A | Discharge status: Home / Self Care | Payer: Medicare Other | Attending: Emergency Medicine | Admitting: Emergency Medicine

## 2018-01-27 ENCOUNTER — Telehealth: Payer: Self-pay | Admitting: Urology

## 2018-01-27 ENCOUNTER — Encounter: Payer: Self-pay | Admitting: Emergency Medicine

## 2018-01-27 ENCOUNTER — Emergency Department: Payer: Medicare Other

## 2018-01-27 ENCOUNTER — Other Ambulatory Visit: Payer: Self-pay

## 2018-01-27 DIAGNOSIS — R110 Nausea: Secondary | ICD-10-CM | POA: Diagnosis not present

## 2018-01-27 DIAGNOSIS — R1032 Left lower quadrant pain: Secondary | ICD-10-CM | POA: Diagnosis present

## 2018-01-27 DIAGNOSIS — N132 Hydronephrosis with renal and ureteral calculous obstruction: Secondary | ICD-10-CM | POA: Insufficient documentation

## 2018-01-27 DIAGNOSIS — I10 Essential (primary) hypertension: Secondary | ICD-10-CM | POA: Insufficient documentation

## 2018-01-27 DIAGNOSIS — Z7982 Long term (current) use of aspirin: Secondary | ICD-10-CM | POA: Insufficient documentation

## 2018-01-27 DIAGNOSIS — E119 Type 2 diabetes mellitus without complications: Secondary | ICD-10-CM | POA: Insufficient documentation

## 2018-01-27 DIAGNOSIS — N200 Calculus of kidney: Secondary | ICD-10-CM | POA: Diagnosis not present

## 2018-01-27 DIAGNOSIS — Z79899 Other long term (current) drug therapy: Secondary | ICD-10-CM | POA: Insufficient documentation

## 2018-01-27 DIAGNOSIS — N179 Acute kidney failure, unspecified: Secondary | ICD-10-CM | POA: Diagnosis not present

## 2018-01-27 DIAGNOSIS — Z87891 Personal history of nicotine dependence: Secondary | ICD-10-CM | POA: Diagnosis not present

## 2018-01-27 LAB — URINALYSIS, COMPLETE (UACMP) WITH MICROSCOPIC
BACTERIA UA: NONE SEEN
Bilirubin Urine: NEGATIVE
Glucose, UA: NEGATIVE mg/dL
KETONES UR: 5 mg/dL — AB
Leukocytes, UA: NEGATIVE
Nitrite: NEGATIVE
PROTEIN: NEGATIVE mg/dL
Specific Gravity, Urine: 1.027 (ref 1.005–1.030)
pH: 5 (ref 5.0–8.0)

## 2018-01-27 LAB — COMPREHENSIVE METABOLIC PANEL
ALBUMIN: 4.1 g/dL (ref 3.5–5.0)
ALK PHOS: 80 U/L (ref 38–126)
ALT: 15 U/L (ref 0–44)
ANION GAP: 9 (ref 5–15)
AST: 16 U/L (ref 15–41)
BUN: 23 mg/dL (ref 8–23)
CO2: 29 mmol/L (ref 22–32)
Calcium: 8.9 mg/dL (ref 8.9–10.3)
Chloride: 98 mmol/L (ref 98–111)
Creatinine, Ser: 1.76 mg/dL — ABNORMAL HIGH (ref 0.61–1.24)
GFR calc Af Amer: 45 mL/min — ABNORMAL LOW (ref >60)
GFR calc non Af Amer: 39 mL/min — ABNORMAL LOW (ref >60)
GLUCOSE: 112 mg/dL — AB (ref 70–99)
POTASSIUM: 4.3 mmol/L (ref 3.5–5.1)
Sodium: 136 mmol/L (ref 135–145)
Total Bilirubin: 1.4 mg/dL — ABNORMAL HIGH (ref 0.3–1.2)
Total Protein: 7.3 g/dL (ref 6.5–8.1)

## 2018-01-27 LAB — CBC
HCT: 42.8 % (ref 39.0–52.0)
HEMOGLOBIN: 14.1 g/dL (ref 13.0–17.0)
MCH: 28.4 pg (ref 26.0–34.0)
MCHC: 32.9 g/dL (ref 30.0–36.0)
MCV: 86.1 fL (ref 80.0–100.0)
Platelets: 214 10*3/uL (ref 150–400)
RBC: 4.97 MIL/uL (ref 4.22–5.81)
RDW: 12.5 % (ref 11.5–15.5)
WBC: 11.6 10*3/uL — ABNORMAL HIGH (ref 4.0–10.5)
nRBC: 0 % (ref 0.0–0.2)

## 2018-01-27 LAB — LIPASE, BLOOD: Lipase: 26 U/L (ref 11–51)

## 2018-01-27 MED ORDER — SODIUM CHLORIDE 0.9 % IV BOLUS
1000.0000 mL | Freq: Once | INTRAVENOUS | Status: AC
  Administered 2018-01-27: 1000 mL via INTRAVENOUS

## 2018-01-27 NOTE — ED Triage Notes (Signed)
Patient states he had lithotripsy done on Thursday for 4 mm obstructing stone. States he was seen in ED Friday for continued pain and nausea. Patient reports no improvement of symptoms despite taking prescribed medications. States he was sent by urologist for follow up CT to see if stone has moved. Patient has piece of stone with him that he passed at home.

## 2018-01-27 NOTE — ED Notes (Signed)
Pt. Signed paper copy of discharge summary.  Topaz not working.

## 2018-01-27 NOTE — ED Notes (Addendum)
First RN: pt here last week for 19mm kidney stone. States pain is bearable, much better than last week but states doesn't think he has passed it. States his MD wanted him to be seen to have another CT to see where stone is. Pt has urine cup with very small stone noted but pt denies passing the 12mm stone seen on scan last week.

## 2018-01-27 NOTE — ED Provider Notes (Signed)
[facility] Emergency Department Provider Note  ____________________________________________  Time seen: Approximately 7:34 PM  I have reviewed the triage vital signs and the nursing notes.   HISTORY  Chief Complaint Abdominal Pain and Nausea   HPI [name] [name] is a 67 y.o. male status post lithotripsy 6 days ago for a mid ureteral 4 mm obstructing stone who presents at the request of his urologist for repeat CT scan due to ongoing pain and nausea.  Patient reports that since having lithotripsy his pain has been constant, moderate to severe, sharp, located in the left flank and radiating down to his left groin.  He has had persistent nausea.  No vomiting, no fever chills, no dysuria or hematuria.  Today he called his urologist to let them know that the pain was still persistent and he recommended repeat CT scan in the emergency room for reevaluation.  Patient reports that since being in the waiting room the pain subsided significantly and is currently mild.  Past Medical History:  Diagnosis Date  . Allergy   . Anxiety   . Arthritis   . Atrial fibrillation [facility])    sees Dr. Lovena Le  . Cataract    forming  . Chronic kidney disease    kidney stone  . Dizziness    or vertigo  . GERD (gastroesophageal reflux disease)   . HLD (hyperlipidemia)   . HTN (hypertension)   . Osteopenia   . Retinal tear   . Sinus bradycardia     Patient Active Problem List   Diagnosis Date Noted  . Left ureteral stone 11/24/2017  . Diabetes mellitus without complication ([place]) 00/17/4944  . ERECTILE DYSFUNCTION, NON-ORGANIC, MILD 11/20/2008  . ATRIAL FIBRILLATION 06/02/2008  . SINUS BRADYCARDIA 06/02/2008  . OSTEOPENIA 06/02/2008  . GERD 04/19/2008  . DIZZINESS OR VERTIGO 04/19/2008  . Hyperlipemia, mixed 10/05/2006  . Anxiety state 10/05/2006  . Essential hypertension 10/05/2006    Past Surgical History:  Procedure Laterality Date  . CARDIAC CATHETERIZATION  2003    normal  . COLONOSCOPY  06-30-12   per Dr. Fuller Plan, adenomatous  polyps, repeat in 5 years  . EXTRACORPOREAL SHOCK WAVE LITHOTRIPSY Left 01/21/2018   Procedure: EXTRACORPOREAL SHOCK WAVE LITHOTRIPSY (ESWL);  Surgeon: Hollice Espy, MD;  Location: [facility] ORS;  Service: Urology;  Laterality: Left;  . POLYPECTOMY    . RETINAL LASER PROCEDURE Right    repair retinal tear   . WART REMOVAL     LEFT UPPER ARM  . WISDOM TOOTH EXTRACTION      Prior to Admission medications   Medication Sig Start Date End Date Taking? Authorizing Provider  aspirin 81 MG tablet Take 1 tablet (81 mg total) by mouth daily. 08/15/16   Laurey Morale, MD  atenolol (TENORMIN) 25 MG tablet Take 1 tablet (25 mg total) by mouth 2 (two) times daily. 08/19/17   Laurey Morale, MD  atorvastatin (LIPITOR) 40 MG tablet Take 1 tablet (40 mg total) by mouth daily. 08/19/17   Laurey Morale, MD  Calcium Carbonate-Vitamin D 600-400 MG-UNIT per tablet Take 1 tablet by mouth daily.      [provider]  docusate sodium (COLACE) 100 MG capsule Take 1 capsule (100 mg total) by mouth 2 (two) times daily. 01/21/18   Hollice Espy, MD  flecainide [facility]) 100 MG tablet May take 2 tablets daily as needed for breakthrough a-fib Patient not taking: Reported on 01/21/2018 05/13/16   Evans Lance, MD  fluticasone [facility]) 50  MCG/ACT nasal spray Place 2 sprays into both nostrils daily.    [provider]  HYDROcodone-acetaminophen (NORCO) 5-325 MG tablet Take 1 tablet by mouth every 6 (six) hours as needed for up to 15 doses for severe pain. 01/22/18   Darel Hong, MD  ibuprofen (ADVIL,MOTRIN) 600 MG tablet Take 1 tablet (600 mg total) by mouth every 8 (eight) hours as needed. 01/22/18   Darel Hong, MD  lisinopril (PRINIVIL,ZESTRIL) 5 MG tablet Take 1 tablet (5 mg total) by mouth daily. 08/19/17   Laurey Morale, MD  ondansetron (ZOFRAN ODT) 4 MG disintegrating tablet Take 1 tablet (4 mg total) by mouth every 8 (eight)  hours as needed for nausea or vomiting. 01/22/18   Darel Hong, MD  sildenafil (REVATIO) 20 MG tablet Take 5 tabs as needed Patient not taking: Reported on 01/21/2018 08/19/17   Laurey Morale, MD  tamsulosin (FLOMAX) 0.4 MG CAPS capsule Take 1 capsule (0.4 mg total) by mouth daily. 01/08/18   Abbie Sons, MD  venlafaxine XR (EFFEXOR-XR) 75 MG 24 hr capsule Take 1 capsule (75 mg total) by mouth daily with breakfast. 08/19/17   Laurey Morale, MD    Allergies Sulfa antibiotics and Paroxetine  Family History  Problem Relation Age of Onset  . Hodgkin's lymphoma Father   . Hyperlipidemia Other   . Hypertension Mother   . Stroke Mother   . Heart attack Brother   . Prostate cancer Brother        1st degree relative <50  . Diabetes Brother        1st degree relative  . Multiple myeloma Brother   . Hypertension Brother   . Heart disease Brother   . Heart attack Brother   . Colon polyps Brother   . Colon cancer Neg Hx   . Esophageal cancer Neg Hx   . Rectal cancer Neg Hx   . Stomach cancer Neg Hx     Social History Social History   Tobacco Use  . Smoking status: Former Smoker    Last attempt to quit: 03/11/1975    Years since quitting: 42.9  . Smokeless tobacco: Never Used  Substance Use Topics  . Alcohol use: Yes    Alcohol/week: 0.0 standard drinks    Comment: couple times a month-SOCIALLY 1 BEER A MONTH  . Drug use: No    Review of Systems  Constitutional: Negative for fever. Eyes: Negative for visual changes. ENT: Negative for sore throat. Neck: No neck pain  Cardiovascular: Negative for chest pain. Respiratory: Negative for shortness of breath. Gastrointestinal: Negative for abdominal pain, vomiting or diarrhea. + nausea Genitourinary: Negative for dysuria. + L flank pain Musculoskeletal: Negative for back pain. Skin: Negative for rash. Neurological: Negative for headaches, weakness or numbness. Psych: No SI or  HI  ____________________________________________   PHYSICAL EXAM:  VITAL SIGNS: ED Triage Vitals  Enc Vitals Group     BP 01/27/18 1713 137/66     Pulse Rate 01/27/18 1713 (!) 58     Resp --      Temp 01/27/18 1713 98.1 F (36.7 C)     Temp Source 01/27/18 1713 Oral     SpO2 01/27/18 1713 99 %     Weight 01/27/18 1714 230 lb (104.3 kg)     Height 01/27/18 1714 6' (1.829 m)     Head Circumference --      Peak Flow --      Pain Score 01/27/18 1746 0  Pain Loc --      Pain Edu? --      Excl. in [place]? --     Constitutional: Alert and oriented. Well appearing and in no apparent distress. HEENT:      Head: Normocephalic and atraumatic.         Eyes: Conjunctivae are normal. Sclera is non-icteric.       Mouth/Throat: Mucous membranes are moist.       Neck: Supple with no signs of meningismus. Cardiovascular: Regular rate and rhythm. No murmurs, gallops, or rubs. 2+ symmetrical distal pulses are present in all extremities. No JVD. Respiratory: Normal respiratory effort. Lungs are clear to auscultation bilaterally. No wheezes, crackles, or rhonchi.  Gastrointestinal: Soft, non tender, and non distended with positive bowel sounds. No rebound or guarding. Genitourinary: No CVA tenderness. Musculoskeletal: Nontender with normal range of motion in all extremities. No edema, cyanosis, or erythema of extremities. Neurologic: Normal speech and language. Face is symmetric. Moving all extremities. No gross focal neurologic deficits are appreciated. Skin: Skin is warm, dry and intact. No rash noted. Psychiatric: Mood and affect are normal. Speech and behavior are normal.  ____________________________________________   LABS (all labs ordered are listed, but only abnormal results are displayed)  Labs Reviewed  COMPREHENSIVE METABOLIC PANEL - Abnormal; Notable for the following components:      Result Value   Glucose, Bld 112 (*)    Creatinine, Ser 1.76 (*)    Total Bilirubin 1.4 (*)     GFR calc non Af Amer 39 (*)    GFR calc Af Amer 45 (*)    All other components within normal limits  CBC - Abnormal; Notable for the following components:   WBC 11.6 (*)    All other components within normal limits  URINALYSIS, COMPLETE (UACMP) WITH MICROSCOPIC - Abnormal; Notable for the following components:   Color, Urine YELLOW (*)    APPearance CLEAR (*)    Hgb urine dipstick MODERATE (*)    Ketones, ur 5 (*)    All other components within normal limits  LIPASE, BLOOD   ____________________________________________  EKG  none  ____________________________________________  RADIOLOGY  I have personally reviewed the images performed during this visit and I agree with the Radiologist's read.   Interpretation by Radiologist:  Ct Renal Stone Study  Result Date: 01/27/2018 CLINICAL DATA:  Flank pain, recurrent urolithiasis EXAM: CT ABDOMEN AND PELVIS WITHOUT CONTRAST TECHNIQUE: Multidetector CT imaging of the abdomen and pelvis was performed following the standard protocol without IV contrast. COMPARISON:  11/24/2017 FINDINGS: Lower chest: No acute abnormality. Hepatobiliary: No focal liver abnormality is seen. No gallstones, gallbladder wall thickening, or biliary dilatation. Pancreas: Unremarkable. No pancreatic ductal dilatation or surrounding inflammatory changes. Spleen: Normal in size without focal abnormality. Adrenals/Urinary Tract: Adrenal glands are unremarkable. 5 mm left UVJ calculus resulting in moderate left hydroureteronephrosis. Punctate nonobstructing left renal calculus. No right obstructive uropathy. Bladder is unremarkable. Stomach/Bowel: Stomach is within normal limits. Appendix appears normal. No evidence of bowel wall thickening, distention, or inflammatory changes. Vascular/Lymphatic: Abdominal aortic atherosclerosis. Normal caliber abdominal aorta. No lymphadenopathy. Reproductive: Prostate is unremarkable. Other: No abdominal wall hernia or abnormality. No  abdominopelvic ascites. Musculoskeletal: No acute or significant osseous findings. IMPRESSION: 1. 5 mm left UVJ calculus resulting in moderate left hydroureteronephrosis. 2.  Aortic Atherosclerosis (ICD10-I70.0). Electronically Signed   By: Kathreen Devoid   On: 01/27/2018 19:31     ____________________________________________   PROCEDURES  Procedure(s) performed: None Procedures Critical Care performed:  None  ____________________________________________   INITIAL IMPRESSION / ASSESSMENT AND PLAN / ED COURSE   67 y.o. male status post lithotripsy 6 days ago for a mid ureteral 4 mm obstructing stone who presents at the request of his urologist for repeat CT scan due to ongoing pain and nausea.  CT was done at the request of Dr. Bernardo Heater and shows a left UVJ 5 mm obstructing stone causing moderate left-sided hydroureteronephrosis.  Patient's prior stone was mid ureter and 4 mm in size.  He does have acute kidney injury with GFR going from greater than 60-39.  This could be due to the stone but also due to NSAID use.  Patient has been taking 600 mg of ibuprofen every 6 hours for several days.  Will give IV fluids.  Patient is allergic to sulfa therefore will hold off on Flomax. UA with no overlying infection. Will discuss with Urology.    _________________________ 8:04 PM on 01/27/2018 -----------------------------------------  Discussed with Dr. Erlene Quan findings of the CT and lab work.  Discussed finding of AKI, she recommended IVF. She recommended follow-up in the office tomorrow for ureteral stent placement.  Patient's pain is well controlled.  Encouraged him to take Tylenol or tramadol instead of NSAIDs for pain control.  Offered to give him a prescription for Percocets however patient reports that he is not able to tolerate strong narcotics at home.  Patient is comfortable with the plan.   As part of my medical decision making, I reviewed the following data within the electronic medical  record:  Nursing notes reviewed and incorporated, Labs reviewed , Old chart reviewed, Radiograph reviewed , A consult was requested and obtained from this/these consultant(s) Urology, Notes from prior ED visits and [place] Controlled Substance Database    Pertinent labs & imaging results that were available during my care of the patient were reviewed by me and considered in my medical decision making (see chart for details).    ____________________________________________   FINAL CLINICAL IMPRESSION(S) / ED DIAGNOSES  Final diagnoses:  Kidney stone  AKI (acute kidney injury) ([place])      NEW MEDICATIONS STARTED DURING THIS VISIT:  ED Discharge Orders    None       Note:  This document was prepared using Dragon voice recognition software and may include unintentional dictation errors.    Rudene Re, MD 01/27/18 2006

## 2018-01-27 NOTE — ED Notes (Signed)
Spoke with first nurse Anda Kraft rather or not I should do blood work. I informed pt that first nurse stated that his kidney functions could have changed since he was last seen and pt stated "I was just trying to keep the expenses down." Informed pt that before I drew blood that he could talk to a nurse and go from there. Pt is fine with that.

## 2018-01-27 NOTE — Telephone Encounter (Signed)
Pt called office today stating that he has passed little fragments, but is still experiencing intense pain. He has an ESWL with Dr. Erlene Quan, but does not feel he is going to be able to pass the stone on his own. Per Dr. Bernardo Heater, pt to go to ED for evaluation, pain management, and possible CT Scan.   Pt states he would like to avoid the ED, would like to know if we can order a scan for him. Please advise.

## 2018-01-27 NOTE — Telephone Encounter (Signed)
Discussed patient with Dr. Alfred Levins (ED) today.  He has returned now for the second time following ESWL with persistent UVJ stone and pain.  Today, his creatinine is been more elevated to 1.7.  His pain was ultimately able to be controlled in the ER and he will be discharged later.  I would like him to be seen in the office ASAP to discuss the option of ureteroscopy urgently to address the stone via URS in the next several days.  Hollice Espy, MD

## 2018-01-28 ENCOUNTER — Ambulatory Visit (INDEPENDENT_AMBULATORY_CARE_PROVIDER_SITE_OTHER): Payer: Medicare Other | Admitting: Urology

## 2018-01-28 ENCOUNTER — Other Ambulatory Visit: Payer: Self-pay | Admitting: Radiology

## 2018-01-28 ENCOUNTER — Encounter: Payer: Self-pay | Admitting: Urology

## 2018-01-28 VITALS — BP 133/71 | HR 52 | Ht 72.0 in | Wt 235.1 lb

## 2018-01-28 DIAGNOSIS — N201 Calculus of ureter: Secondary | ICD-10-CM

## 2018-01-28 DIAGNOSIS — N132 Hydronephrosis with renal and ureteral calculous obstruction: Secondary | ICD-10-CM | POA: Diagnosis not present

## 2018-01-28 DIAGNOSIS — N23 Unspecified renal colic: Secondary | ICD-10-CM

## 2018-01-28 DIAGNOSIS — M8580 Other specified disorders of bone density and structure, unspecified site: Secondary | ICD-10-CM | POA: Insufficient documentation

## 2018-01-28 NOTE — Telephone Encounter (Signed)
Patient was given the [facility] Information form below as well as the Instructions for Pre-Admission Testing form & a map of [facility].   [address], [address] [place], [place] [address] Telephone: [phone] Fax: [phone]   Thank you for choosing [facility] for your upcoming surgery!  We are always here to assist in your urological needs.  Please read the following information with specific details for your upcoming appointments related to your surgery. Please contact Corrisa Gibby at [phone] Option 3 with any questions.  The Name of Your Surgery: Left ureteroscopy, laser lithotripsy, ureteral stent placement Your Surgery Date: 02/01/2018 with arrival to Registration desk on the 1st floor of [facility] at 12:00 Your Surgeon: Hollice Espy  Surgery will take place at Same Day Surgery which is located on the second floor of the [facility].   Patient was advised to have nothing to eat or drink after midnight the night prior to surgery except that he may have only water until 2 hours before surgery with nothing to drink within 2 hours of surgery & to have a driver present upon discharge after surgery.  The patient states he currently takes aspirin 81 mg & was informed to hold medication beginning immediately. Patient's questions were answered and he expressed understanding of these instructions.

## 2018-01-28 NOTE — Progress Notes (Signed)
01/28/2018 1:17 PM   [name] 10-07-1950 [id number]  Referring provider: Laurey Morale, MD [place], [place] [address]  Chief Complaint  Patient presents with  . Nephrolithiasis    HPI: 67 year old male status post shockwave lithotripsy of a 5 mm left distal ureteral calculus by Dr. Erlene Quan on 01/21/2018.  He has had 2 ED visits for renal colic on 86/76 and 19/50.  CT scans were performed at each visit both showing an approximately 5 mm distal fragment with moderate hydronephrosis/hydroureter.  He has had some nausea without vomiting.  Denies fever or chills.  He is on tamsulosin.  He has passed one small fragment.  Noncontrast CT scan performed yesterday shows a 5 mm fragment at the UVJ.  PMH: Past Medical History:  Diagnosis Date  . Allergy   . Anxiety   . Arthritis   . Atrial fibrillation [facility])    sees Dr. Lovena Le  . Cataract    forming  . Chronic kidney disease    kidney stone  . Dizziness    or vertigo  . GERD (gastroesophageal reflux disease)   . HLD (hyperlipidemia)   . HTN (hypertension)   . Osteopenia   . Retinal tear   . Sinus bradycardia     Surgical History: Past Surgical History:  Procedure Laterality Date  . CARDIAC CATHETERIZATION  2003   normal  . COLONOSCOPY  06-30-12   per Dr. Fuller Plan, adenomatous  polyps, repeat in 5 years  . EXTRACORPOREAL SHOCK WAVE LITHOTRIPSY Left 01/21/2018   Procedure: EXTRACORPOREAL SHOCK WAVE LITHOTRIPSY (ESWL);  Surgeon: Hollice Espy, MD;  Location: [facility] ORS;  Service: Urology;  Laterality: Left;  . POLYPECTOMY    . RETINAL LASER PROCEDURE Right    repair retinal tear   . WART REMOVAL     LEFT UPPER ARM  . WISDOM TOOTH EXTRACTION      Home Medications:  Allergies as of 01/28/2018      Reactions   Sulfa Antibiotics Hives   Oxycodone Nausea And Vomiting   Paroxetine Other (See Comments)   NOT ABLE TO TOLERATE      Medication List        Accurate as of 01/28/18  1:17 PM. Always  use your most recent med list.          aspirin 81 MG tablet Take 1 tablet (81 mg total) by mouth daily.   atenolol 25 MG tablet Commonly known as:  TENORMIN Take 1 tablet (25 mg total) by mouth 2 (two) times daily.   atorvastatin 40 MG tablet Commonly known as:  LIPITOR Take 1 tablet (40 mg total) by mouth daily.   Calcium Carbonate-Vitamin D 600-400 MG-UNIT tablet Take 1 tablet by mouth daily.   docusate sodium 100 MG capsule Commonly known as:  COLACE Take 1 capsule (100 mg total) by mouth 2 (two) times daily.   flecainide 100 MG tablet Commonly known as:  TAMBOCOR May take 2 tablets daily as needed for breakthrough a-fib   fluticasone 50 MCG/ACT nasal spray Commonly known as:  FLONASE Place 2 sprays into both nostrils daily.   HYDROcodone-acetaminophen 5-325 MG tablet Commonly known as:  NORCO/VICODIN Take 1 tablet by mouth every 6 (six) hours as needed for up to 15 doses for severe pain.   ibuprofen 600 MG tablet Commonly known as:  ADVIL,MOTRIN Take 1 tablet (600 mg total) by mouth every 8 (eight) hours as needed.   lisinopril 5 MG tablet Commonly known as:  PRINIVIL,ZESTRIL Take  1 tablet (5 mg total) by mouth daily.   ondansetron 4 MG disintegrating tablet Commonly known as:  ZOFRAN-ODT Take 1 tablet (4 mg total) by mouth every 8 (eight) hours as needed for nausea or vomiting.   sildenafil 20 MG tablet Commonly known as:  REVATIO Take 5 tabs as needed   tamsulosin 0.4 MG Caps capsule Commonly known as:  FLOMAX Take 1 capsule (0.4 mg total) by mouth daily.   venlafaxine XR 75 MG 24 hr capsule Commonly known as:  EFFEXOR-XR Take 1 capsule (75 mg total) by mouth daily with breakfast.       Allergies:  Allergies  Allergen Reactions  . Sulfa Antibiotics Hives  . Oxycodone Nausea And Vomiting  . Paroxetine Other (See Comments)    NOT ABLE TO TOLERATE    Family History: Family History  Problem Relation Age of Onset  . Hodgkin's lymphoma Father    . Hyperlipidemia Other   . Hypertension Mother   . Stroke Mother   . Heart attack Brother   . Prostate cancer Brother        1st degree relative <50  . Diabetes Brother        1st degree relative  . Multiple myeloma Brother   . Hypertension Brother   . Heart disease Brother   . Heart attack Brother   . Colon polyps Brother   . Colon cancer Neg Hx   . Esophageal cancer Neg Hx   . Rectal cancer Neg Hx   . Stomach cancer Neg Hx     Social History:  reports that he quit smoking about 42 years ago. He has never used smokeless tobacco. He reports that he drinks alcohol. He reports that he does not use drugs.  ROS: UROLOGY Frequent Urination?: No Hard to postpone urination?: No Burning/pain with urination?: No Get up at night to urinate?: No Leakage of urine?: No Urine stream starts and stops?: No Trouble starting stream?: No Do you have to strain to urinate?: No Blood in urine?: No Urinary tract infection?: No Sexually transmitted disease?: No Injury to kidneys or bladder?: No Painful intercourse?: No Weak stream?: No Erection problems?: No Penile pain?: No  Gastrointestinal Nausea?: Yes Vomiting?: No Indigestion/heartburn?: No Diarrhea?: No Constipation?: No  Constitutional Fever: No Night sweats?: No Weight loss?: No Fatigue?: No  Skin Skin rash/lesions?: No Itching?: No  Eyes Blurred vision?: No Double vision?: No  Ears/Nose/Throat Sore throat?: No Sinus problems?: No  Hematologic/Lymphatic Swollen glands?: No Easy bruising?: No  Cardiovascular Leg swelling?: No Chest pain?: No  Respiratory Cough?: No Shortness of breath?: No  Endocrine Excessive thirst?: No  Musculoskeletal Back pain?: No Joint pain?: No  Neurological Headaches?: No Dizziness?: No  Psychologic Depression?: No Anxiety?: No  Physical Exam: BP 133/71 (BP Location: Left Arm, Patient Position: Sitting, Cuff Size: Large)   Pulse (!) 52   Ht 6' (1.829 m)   Wt  235 lb 1.6 oz (106.6 kg)   BMI 31.89 kg/m   Constitutional:  Alert and oriented, No acute distress. HEENT: [place] AT, moist mucus membranes.  Trachea midline, no masses. Cardiovascular: No clubbing, cyanosis, or edema.  RRR Respiratory: Normal respiratory effort, no increased work of breathing.  Lungs clear GI: Abdomen is soft, nontender, nondistended, no abdominal masses GU: No CVA tenderness Lymph: No cervical or inguinal lymphadenopathy. Skin: No rashes, bruises or suspicious lesions. Neurologic: Grossly intact, no focal deficits, moving all 4 extremities. Psychiatric: Normal mood and affect.    Urinalysis    Component  Value Date/Time   COLORURINE YELLOW (A) 01/27/2018 1748   APPEARANCEUR CLEAR (A) 01/27/2018 1748   APPEARANCEUR Clear 01/08/2018 0928   LABSPEC 1.027 01/27/2018 1748   PHURINE 5.0 01/27/2018 1748   GLUCOSEU NEGATIVE 01/27/2018 1748   HGBUR MODERATE (A) 01/27/2018 1748   HGBUR trace-lysed 10/10/2009 0816   BILIRUBINUR NEGATIVE 01/27/2018 1748   BILIRUBINUR Negative 01/08/2018 0928   KETONESUR 5 (A) 01/27/2018 1748   PROTEINUR NEGATIVE 01/27/2018 1748   UROBILINOGEN 1.0 08/19/2017 0931   UROBILINOGEN 0.2 10/10/2009 0816   NITRITE NEGATIVE 01/27/2018 1748   LEUKOCYTESUR NEGATIVE 01/27/2018 1748   LEUKOCYTESUR Negative 01/08/2018 0928    Lab Results  Component Value Date   LABMICR See below: 01/08/2018   WBCUA None seen 01/08/2018   RBCUA 0-2 01/08/2018   LABEPIT None seen 01/08/2018   MUCUS Present (A) 01/08/2018   BACTERIA NONE SEEN 01/27/2018    Pertinent Imaging: CT images personally reviewed Results for orders placed during the hospital encounter of 01/27/18  CT Renal Stone Study   Narrative CLINICAL DATA:  Flank pain, recurrent urolithiasis  EXAM: CT ABDOMEN AND PELVIS WITHOUT CONTRAST  TECHNIQUE: Multidetector CT imaging of the abdomen and pelvis was performed following the standard protocol without IV contrast.  COMPARISON:   11/24/2017  FINDINGS: Lower chest: No acute abnormality.  Hepatobiliary: No focal liver abnormality is seen. No gallstones, gallbladder wall thickening, or biliary dilatation.  Pancreas: Unremarkable. No pancreatic ductal dilatation or surrounding inflammatory changes.  Spleen: Normal in size without focal abnormality.  Adrenals/Urinary Tract: Adrenal glands are unremarkable. 5 mm left UVJ calculus resulting in moderate left hydroureteronephrosis. Punctate nonobstructing left renal calculus. No right obstructive uropathy. Bladder is unremarkable.  Stomach/Bowel: Stomach is within normal limits. Appendix appears normal. No evidence of bowel wall thickening, distention, or inflammatory changes.  Vascular/Lymphatic: Abdominal aortic atherosclerosis. Normal caliber abdominal aorta. No lymphadenopathy.  Reproductive: Prostate is unremarkable.  Other: No abdominal wall hernia or abnormality. No abdominopelvic ascites.  Musculoskeletal: No acute or significant osseous findings.  IMPRESSION: 1. 5 mm left UVJ calculus resulting in moderate left hydroureteronephrosis. 2.  Aortic Atherosclerosis (ICD10-I70.0).   Electronically Signed   By: Kathreen Devoid   On: 01/27/2018 19:31     Assessment & Plan:   Status post shockwave lithotripsy of a 5 mm left distal ureteral calculus with incomplete fragmentation and persistent colic.  He would like to tentatively schedule ureteroscopic removal early next week if he does not pass his stone.  Dr. Erlene Quan had an opening on Monday and she and the patient agreed for her to do the case. The indications and nature of the planned procedure were discussed as well as the potential  benefits and expected outcome.  Alternatives have been discussed in detail. The most common complications and side effects were discussed including but not limited to infection/sepsis; blood loss; damage to urethra, bladder, ureter; need for prolonged stent placement as  well as general anesthesia risks.  All of his questions were answered and he desires to proceed.  At the time of our visit he did not appear to be distracted or in pain.   Abbie Sons, [place] [address], [place], [place] [address] [phone]

## 2018-01-29 ENCOUNTER — Other Ambulatory Visit: Payer: Self-pay

## 2018-01-29 ENCOUNTER — Encounter: Payer: Self-pay | Admitting: *Deleted

## 2018-01-29 NOTE — Pre-Procedure Instructions (Signed)
Made telephone contact with patient to review medical history, medications, allergies, etc.  Identified which medications to take on the morning of surgery, NPO status and having a ride home. Patient understands and agrees with all information provided to him.

## 2018-01-30 ENCOUNTER — Encounter: Payer: Self-pay | Admitting: Anesthesiology

## 2018-01-31 MED ORDER — CEFAZOLIN SODIUM-DEXTROSE 2-4 GM/100ML-% IV SOLN: 2.0000 g | INTRAVENOUS | Status: DC

## 2018-02-01 ENCOUNTER — Ambulatory Visit: Admission: RE | Admit: 2018-02-01 | Payer: Medicare Other | Source: Ambulatory Visit | Admitting: Urology

## 2018-02-01 ENCOUNTER — Telehealth: Payer: Self-pay | Admitting: Radiology

## 2018-02-01 HISTORY — DX: Headache, unspecified: R51.9

## 2018-02-01 HISTORY — DX: Major depressive disorder, single episode, unspecified: F32.9

## 2018-02-01 HISTORY — DX: Cardiac arrhythmia, unspecified: I49.9

## 2018-02-01 HISTORY — DX: Depression, unspecified: F32.A

## 2018-02-01 HISTORY — DX: Headache: R51

## 2018-02-01 SURGERY — CYSTOSCOPY/URETEROSCOPY/HOLMIUM LASER/STENT PLACEMENT
Anesthesia: Choice | Laterality: Left

## 2018-02-01 NOTE — Telephone Encounter (Signed)
Patient reports passing kidney stone over the weekend & wants to cancel surgery. Per Dr Erlene Quan patient first needs to bring stone to office with possible KUB prior to determining need to cancel surgery. Patient states he had bacon & eggs at 8:00 this morning. Advised patient to remain NPO until it is known if surgery is still needed. Patient expresses understanding of conversation.

## 2018-02-08 ENCOUNTER — Encounter: Payer: Self-pay | Admitting: Urology

## 2018-02-08 ENCOUNTER — Ambulatory Visit (INDEPENDENT_AMBULATORY_CARE_PROVIDER_SITE_OTHER): Payer: Medicare Other | Admitting: Urology

## 2018-02-08 ENCOUNTER — Encounter: Payer: Self-pay | Admitting: Family Medicine

## 2018-02-08 VITALS — BP 139/78 | HR 60 | Ht 72.0 in | Wt 234.0 lb

## 2018-02-08 DIAGNOSIS — N200 Calculus of kidney: Secondary | ICD-10-CM | POA: Diagnosis not present

## 2018-02-08 DIAGNOSIS — N132 Hydronephrosis with renal and ureteral calculous obstruction: Secondary | ICD-10-CM

## 2018-02-08 NOTE — Progress Notes (Signed)
02/08/2018 2:02 PM   [name] [name] [birth year] [id number]  No chief complaint on file.  HPI: [name] [name] is a 67 y.o. male status post shockwave lithotripsy of a 5 mm left distal ureteral calculus by Dr. Erlene Quan on 01/21/2018.  He has had 2 ED visits for renal colic on 90/24 and 09/73.  CT scans were performed at each visit both showing an approximately 5 mm distal fragment with moderate hydronephrosis/hydroureter. Noncontrast CT scan performed 01/27/2018 showed a 84m fragment at the UVJ. He was scheduled to have his stone removed on 02/01/2018 but passed it on 01/30/2018 and has since been relieved of his symptoms. He presents today to follow up.  He was told years ago that he has a 278mstone in his kidney stone in his lining that presents no concern.  He is implementing recommended lifestyle changes by drinking more water.  PMH: Past Medical History:  Diagnosis Date  . Allergy   . Anxiety   . Arthritis   . [facility]   [facility] Dr. TaLovena Le. Cataract    forming  . Chronic kidney disease    kidney stone  . Depression   . Dizziness    or vertigo  . Dysrhythmia    atrial fibrillation, once or twice a year  . GERD (gastroesophageal reflux disease)   . Headache    eyglass flicker in eye is migraine symptoms  . History of kidney stones   . HLD (hyperlipidemia)   . HTN (hypertension)   . Osteopenia   . Retinal tear   . Sinus bradycardia     Surgical History: Past Surgical History:  Procedure Laterality Date  . CARDIAC CATHETERIZATION  2003   normal  . COLONOSCOPY  06-30-12   per Dr. StFuller Planadenomatous  polyps, repeat in 5 years  . EXTRACORPOREAL SHOCK WAVE LITHOTRIPSY Left 01/21/2018   Procedure: EXTRACORPOREAL SHOCK WAVE LITHOTRIPSY (ESWL);  Surgeon: BrHollice EspyMD;  Location: [facility] ORS;  Service: Urology;  Laterality: Left;  . EYE SURGERY     retinal  . POLYPECTOMY    . RETINAL LASER PROCEDURE Right    repair retinal tear   . WART REMOVAL       LEFT UPPER ARM  . WISDOM TOOTH EXTRACTION     Home Medications:  Allergies as of 02/08/2018      Reactions   Oxycodone Nausea And Vomiting   Paroxetine Nausea And Vomiting, Other (See Comments)   Altered mental state   Sulfa Antibiotics Hives      Medication List        Accurate as of 02/08/18  2:02 PM. Always use your most recent med list.          atenolol 25 MG tablet Commonly known as:  TENORMIN Take 1 tablet (25 mg total) by mouth 2 (two) times daily.   atorvastatin 40 MG tablet Commonly known as:  LIPITOR Take 1 tablet (40 mg total) by mouth daily.   flecainide 100 MG tablet Commonly known as:  TAMBOCOR May take 2 tablets daily as needed for breakthrough a-fib   fluticasone 50 MCG/ACT nasal spray Commonly known as:  FLONASE Place 2 sprays into both nostrils daily as needed for allergies.   lisinopril 5 MG tablet Commonly known as:  PRINIVIL,ZESTRIL Take 1 tablet (5 mg total) by mouth daily.   sildenafil 20 MG tablet Commonly known as:  REVATIO Take 5 tabs as needed   SYSTANE BALANCE OP Place 1 drop into both  eyes daily as needed (dry eyes).   tamsulosin 0.4 MG Caps capsule Commonly known as:  FLOMAX Take 1 capsule (0.4 mg total) by mouth daily.   venlafaxine XR 75 MG 24 hr capsule Commonly known as:  EFFEXOR-XR Take 1 capsule (75 mg total) by mouth daily with breakfast.      Allergies:  Allergies  Allergen Reactions  . Oxycodone Nausea And Vomiting  . Paroxetine Nausea And Vomiting and Other (See Comments)    Altered mental state  . Sulfa Antibiotics Hives   Family History: Family History  Problem Relation Age of Onset  . Hodgkin's lymphoma Father   . Hyperlipidemia Other   . Hypertension Mother   . Stroke Mother   . Heart attack Brother   . Prostate cancer Brother        1st degree relative <50  . Diabetes Brother        1st degree relative  . Multiple myeloma Brother   . Hypertension Brother   . Heart disease Brother   . Heart  attack Brother   . Colon polyps Brother   . Colon cancer Neg Hx   . Esophageal cancer Neg Hx   . Rectal cancer Neg Hx   . Stomach cancer Neg Hx    Social History:  reports that he quit smoking about 42 years ago. His smoking use included cigarettes. He has never used smokeless tobacco. He reports that he drinks alcohol. He reports that he does not use drugs.  ROS: UROLOGY Frequent Urination?: No Hard to postpone urination?: No Burning/pain with urination?: No Get up at night to urinate?: No Leakage of urine?: No Urine stream starts and stops?: No Trouble starting stream?: No Do you have to strain to urinate?: No Blood in urine?: No Urinary tract infection?: No Sexually transmitted disease?: No Injury to kidneys or bladder?: No Painful intercourse?: No Weak stream?: No Erection problems?: No Penile pain?: No  Gastrointestinal Nausea?: No Vomiting?: No Indigestion/heartburn?: No Diarrhea?: No Constipation?: No  Constitutional Fever: No Night sweats?: No Weight loss?: No Fatigue?: No  Skin Skin rash/lesions?: No Itching?: No  Eyes Blurred vision?: No Double vision?: No  Ears/Nose/Throat Sore throat?: No Sinus problems?: No  Hematologic/Lymphatic Swollen glands?: No Easy bruising?: No  Cardiovascular Leg swelling?: No Chest pain?: No  Respiratory Cough?: No Shortness of breath?: No  Endocrine Excessive thirst?: No  Musculoskeletal Back pain?: No Joint pain?: No  Neurological Headaches?: No Dizziness?: No  Psychologic Depression?: No Anxiety?: No  Physical Exam BP 139/78 (BP Location: Left Arm, Patient Position: Sitting, Cuff Size: Normal)   Pulse 60   Ht 6' (1.829 m)   Wt 234 lb (106.1 kg)   BMI 31.74 kg/m   Constitutional:  Alert and oriented, No acute distress. Respiratory: Normal respiratory effort, no increased work of breathing.  GU: No CVA tenderness Skin: No rashes, bruises or suspicious lesions. Neurologic: Grossly intact,  no focal deficits, moving all 4 extremities. Psychiatric: Normal mood and affect.   Assessment & Plan:    1. Nephrolithiasis - Recommended a follow up RUS before the end of the year - Discussed his options following stone including lifestyle and dietary changes as well as metabolic evaluation. Patient prefers to continue implementing lifestyle changes at this time. - It was recommended that he increase his water intake to keep his urine output greater than 2L per day. Ten 10 ounce glasses of water per day is generally enough to produce this output. Oxalate moderation was discussed and he was  provided literature on high oxalate foods and beverages. - Avoidance of salty foods and added salt was discussed as well as avoidance of excessive intake of animal protein.  Increased intake of potassium rich citrus products was recommended. - Renal US by the end of this month - RTC in 6 months for follow up and KUB   Abbie Sons, MD  [place] [address] [place], [place] [address] [phone]  I, Temidayo Atanda-[place] , am acting as a scribe for [employer], MD  I, Abbie Sons, MD, have reviewed all documentation for this visit. The documentation on 02/08/18 for the exam, diagnosis, procedures, and orders are all accurate and complete.

## 2018-02-09 NOTE — Telephone Encounter (Signed)
Dr. Fry please advise. Thanks  

## 2018-02-11 ENCOUNTER — Other Ambulatory Visit: Payer: Self-pay | Admitting: Urology

## 2018-02-12 NOTE — Telephone Encounter (Signed)
That would be an excellent question for him to ask Dr. Lovena Le, his cardiologist

## 2018-02-14 NOTE — Progress Notes (Unsigned)
Stone analysis was mixed calcium oxalate

## 2018-02-15 ENCOUNTER — Ambulatory Visit
Admission: RE | Admit: 2018-02-15 | Discharge: 2018-02-15 | Disposition: A | Discharge status: Home / Self Care | Payer: Medicare Other | Source: Ambulatory Visit | Attending: Urology | Admitting: Urology

## 2018-02-15 ENCOUNTER — Telehealth: Payer: Self-pay

## 2018-02-15 DIAGNOSIS — N132 Hydronephrosis with renal and ureteral calculous obstruction: Secondary | ICD-10-CM | POA: Insufficient documentation

## 2018-02-15 NOTE — Telephone Encounter (Signed)
Stone analysis was mixed calcium oxalate

## 2018-02-16 ENCOUNTER — Telehealth: Payer: Self-pay | Admitting: Family Medicine

## 2018-02-16 NOTE — Telephone Encounter (Signed)
LMOM for patient to return call.

## 2018-02-16 NOTE — Telephone Encounter (Signed)
-----   Message from Abbie Sons, MD sent at 02/16/2018  8:40 AM EST ----- Ultrasound showed resolution of previously noted hydronephrosis.

## 2018-02-17 NOTE — Telephone Encounter (Signed)
Patient notified and voiced understanding.

## 2018-04-06 DIAGNOSIS — Z0100 Encounter for examination of eyes and vision without abnormal findings: Secondary | ICD-10-CM | POA: Diagnosis not present

## 2018-04-14 DIAGNOSIS — M50322 Other cervical disc degeneration at C5-C6 level: Secondary | ICD-10-CM | POA: Diagnosis not present

## 2018-04-14 DIAGNOSIS — M546 Pain in thoracic spine: Secondary | ICD-10-CM | POA: Diagnosis not present

## 2018-04-14 DIAGNOSIS — M9902 Segmental and somatic dysfunction of thoracic region: Secondary | ICD-10-CM | POA: Diagnosis not present

## 2018-04-14 DIAGNOSIS — M9901 Segmental and somatic dysfunction of cervical region: Secondary | ICD-10-CM | POA: Diagnosis not present

## 2018-04-14 DIAGNOSIS — M9903 Segmental and somatic dysfunction of lumbar region: Secondary | ICD-10-CM | POA: Diagnosis not present

## 2018-04-14 DIAGNOSIS — M545 Low back pain: Secondary | ICD-10-CM | POA: Diagnosis not present

## 2018-04-16 DIAGNOSIS — M9903 Segmental and somatic dysfunction of lumbar region: Secondary | ICD-10-CM | POA: Diagnosis not present

## 2018-04-16 DIAGNOSIS — M545 Low back pain: Secondary | ICD-10-CM | POA: Diagnosis not present

## 2018-04-16 DIAGNOSIS — M9902 Segmental and somatic dysfunction of thoracic region: Secondary | ICD-10-CM | POA: Diagnosis not present

## 2018-04-16 DIAGNOSIS — M9901 Segmental and somatic dysfunction of cervical region: Secondary | ICD-10-CM | POA: Diagnosis not present

## 2018-04-16 DIAGNOSIS — M546 Pain in thoracic spine: Secondary | ICD-10-CM | POA: Diagnosis not present

## 2018-04-16 DIAGNOSIS — M50322 Other cervical disc degeneration at C5-C6 level: Secondary | ICD-10-CM | POA: Diagnosis not present

## 2018-04-20 DIAGNOSIS — M546 Pain in thoracic spine: Secondary | ICD-10-CM | POA: Diagnosis not present

## 2018-04-20 DIAGNOSIS — M545 Low back pain: Secondary | ICD-10-CM | POA: Diagnosis not present

## 2018-04-20 DIAGNOSIS — M9901 Segmental and somatic dysfunction of cervical region: Secondary | ICD-10-CM | POA: Diagnosis not present

## 2018-04-20 DIAGNOSIS — M9902 Segmental and somatic dysfunction of thoracic region: Secondary | ICD-10-CM | POA: Diagnosis not present

## 2018-04-20 DIAGNOSIS — M50322 Other cervical disc degeneration at C5-C6 level: Secondary | ICD-10-CM | POA: Diagnosis not present

## 2018-04-20 DIAGNOSIS — M9903 Segmental and somatic dysfunction of lumbar region: Secondary | ICD-10-CM | POA: Diagnosis not present

## 2018-05-26 ENCOUNTER — Ambulatory Visit (INDEPENDENT_AMBULATORY_CARE_PROVIDER_SITE_OTHER): Payer: Medicare HMO

## 2018-05-26 ENCOUNTER — Encounter: Payer: Self-pay | Admitting: Podiatry

## 2018-05-26 ENCOUNTER — Other Ambulatory Visit: Payer: Self-pay | Admitting: Podiatry

## 2018-05-26 ENCOUNTER — Other Ambulatory Visit: Payer: Self-pay

## 2018-05-26 ENCOUNTER — Ambulatory Visit (INDEPENDENT_AMBULATORY_CARE_PROVIDER_SITE_OTHER): Payer: Medicare HMO | Admitting: Podiatry

## 2018-05-26 DIAGNOSIS — R52 Pain, unspecified: Secondary | ICD-10-CM

## 2018-05-26 DIAGNOSIS — M67479 Ganglion, unspecified ankle and foot: Secondary | ICD-10-CM

## 2018-05-26 DIAGNOSIS — M67471 Ganglion, right ankle and foot: Secondary | ICD-10-CM

## 2018-05-26 NOTE — Progress Notes (Signed)
Subjective:  Patient ID: [name] [name], male    DOB: 1950/09/21,  MRN: [id number] HPI Chief Complaint  Patient presents with  . Toe Pain    Patient presents today for knot on right 2nd toe x 3-4 months.  He reports he has a burning pain in the knot, but does not hurt.  He says it bothers him when he is exercising.  No treatment has been done    68 y.o. male presents with the above complaint.   ROS: Denies fever chills nausea vomiting muscle aches pains calf pain back pain chest pain shortness of breath.  Past Medical History:  Diagnosis Date  . Allergy   . Anxiety   . Arthritis   . Atrial fibrillation [facility])    sees Dr. Lovena Le  . Cataract    forming  . Chronic kidney disease    kidney stone  . Depression   . Dizziness    or vertigo  . Dysrhythmia    atrial fibrillation, once or twice a year  . GERD (gastroesophageal reflux disease)   . Headache    eyglass flicker in eye is migraine symptoms  . History of kidney stones   . HLD (hyperlipidemia)   . HTN (hypertension)   . Osteopenia   . Retinal tear   . Sinus bradycardia    Past Surgical History:  Procedure Laterality Date  . CARDIAC CATHETERIZATION  2003   normal  . COLONOSCOPY  06-30-12   per Dr. Fuller Plan, adenomatous  polyps, repeat in 5 years  . EXTRACORPOREAL SHOCK WAVE LITHOTRIPSY Left 01/21/2018   Procedure: EXTRACORPOREAL SHOCK WAVE LITHOTRIPSY (ESWL);  Surgeon: Hollice Espy, MD;  Location: [facility] ORS;  Service: Urology;  Laterality: Left;  . EYE SURGERY     retinal  . POLYPECTOMY    . RETINAL LASER PROCEDURE Right    repair retinal tear   . WART REMOVAL     LEFT UPPER ARM  . WISDOM TOOTH EXTRACTION      Current Outpatient Medications:  .  atenolol (TENORMIN) 25 MG tablet, Take 1 tablet (25 mg total) by mouth 2 (two) times daily., Disp: 180 tablet, Rfl: 3 .  atorvastatin (LIPITOR) 40 MG tablet, Take 1 tablet (40 mg total) by mouth daily. (Patient taking differently: Take 40 mg by mouth every evening. ),  Disp: 90 tablet, Rfl: 3 .  flecainide (TAMBOCOR) 100 MG tablet, May take 2 tablets daily as needed for breakthrough a-fib (Patient taking differently: Take 200 mg by mouth daily as needed (breakthrough a-fib). ), Disp: 30 tablet, Rfl: 5 .  fluticasone (FLONASE) 50 MCG/ACT nasal spray, Place 2 sprays into both nostrils daily as needed for allergies. , Disp: , Rfl:  .  lisinopril (PRINIVIL,ZESTRIL) 5 MG tablet, Take 1 tablet (5 mg total) by mouth daily., Disp: 90 tablet, Rfl: 3 .  Propylene Glycol (SYSTANE BALANCE OP), Place 1 drop into both eyes daily as needed (dry eyes)., Disp: , Rfl:  .  sildenafil (REVATIO) 20 MG tablet, Take 5 tabs as needed (Patient taking differently: Take 100 mg by mouth daily as needed (ED). ), Disp: 50 tablet, Rfl: 11 .  venlafaxine XR (EFFEXOR-XR) 75 MG 24 hr capsule, Take 1 capsule (75 mg total) by mouth daily with breakfast., Disp: 90 capsule, Rfl: 3 No current facility-administered medications for this visit.   Facility-Administered Medications Ordered in Other Visits:  .  ceFAZolin (ANCEF) IVPB 2g/100 mL premix, 2 g, Intravenous, 30 min Pre-Op, Stoioff, Scott C, MD  Allergies  Allergen Reactions  .  Oxycodone Nausea And Vomiting  . Paroxetine Nausea And Vomiting and Other (See Comments)    Altered mental state  . Sulfa Antibiotics Hives   Review of Systems Objective:  There were no vitals filed for this visit.  General: Well developed, nourished, in no acute distress, alert and oriented x3   Dermatological: Skin is warm, dry and supple bilateral. Nails x 10 are well maintained; remaining integument appears unremarkable at this time. There are no open sores, no preulcerative lesions, no rash or signs of infection present.  Small freely moving nodular mass measuring less than 6 mm in diameter medial aspect second digit right foot.  Does not appear to be vascular in nature appears to be a ganglion  Vascular: Dorsalis Pedis artery and Posterior Tibial artery pedal  pulses are 2/4 bilateral with immedate capillary fill time. Pedal hair growth present. No varicosities and no lower extremity edema present bilateral.   Neruologic: Grossly intact via light touch bilateral. Vibratory intact via tuning fork bilateral. Protective threshold with Semmes Wienstein monofilament intact to all pedal sites bilateral. Patellar and Achilles deep tendon reflexes 2+ bilateral. No Babinski or clonus noted bilateral.   Musculoskeletal: No gross boney pedal deformities bilateral. No pain, crepitus, or limitation noted with foot and ankle range of motion bilateral. Muscular strength 5/5 in all groups tested bilateral.  Gait: Unassisted, Nonantalgic.    Radiographs:  No acute findings  Assessment & Plan:   Assessment: Ganglion cyst second digit right foot.  Plan: Local anesthetic was administered about the second digit of the right foot a total of 2 cc was utilized.  It was then prepped with Betadine and the cyst was drained I introduced Kenalog into the area and applied a dry sterile dressing.  The cyst appeared to be a ganglion fluid was clear and gelatinous.  Follow-up with him as needed     Thos Matsumoto T. [place], [place]

## 2018-07-21 ENCOUNTER — Telehealth: Payer: Self-pay | Admitting: Family Medicine

## 2018-07-21 NOTE — Telephone Encounter (Signed)
Patient faxed a [employer] form for work over about 3 weeks ago but it was never received.  Now the form for work is late.  He is refaxing the form but wants to know if we can get it filled out pretty quickly.  Thanks!

## 2018-07-23 NOTE — Telephone Encounter (Signed)
Dr. Sarajane Jews please advise.  Pt is anxious about getting the forms back in before the due date. thanks

## 2018-07-23 NOTE — Telephone Encounter (Signed)
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 Advised per Truman Hayward that it is likely at the front desk, but as soon as she received this form she will contact him and let him know.  CB#: [phone]

## 2018-07-23 NOTE — Telephone Encounter (Signed)
Pt called back in to confirm received fax, Cherene Altes) confirmed receiving. Made pt aware. Pt says that he is almost past his deadline with getting form back. Pt would like to know if this could be handled as soon as possible.

## 2018-07-29 ENCOUNTER — Ambulatory Visit: Payer: Medicare Other | Admitting: Family Medicine

## 2018-08-03 NOTE — Telephone Encounter (Signed)
I answered this before my vacation. He needs to have his cardiologist fill out this form, not me

## 2018-08-04 ENCOUNTER — Encounter: Payer: Self-pay | Admitting: *Deleted

## 2018-08-24 ENCOUNTER — Telehealth (HOSPITAL_COMMUNITY): Payer: Self-pay | Admitting: *Deleted

## 2018-08-24 ENCOUNTER — Ambulatory Visit (INDEPENDENT_AMBULATORY_CARE_PROVIDER_SITE_OTHER): Payer: Medicare HMO | Admitting: Family Medicine

## 2018-08-24 ENCOUNTER — Other Ambulatory Visit: Payer: Self-pay

## 2018-08-24 ENCOUNTER — Encounter: Payer: Self-pay | Admitting: Family Medicine

## 2018-08-24 ENCOUNTER — Ambulatory Visit: Payer: Medicare Other | Admitting: Urology

## 2018-08-24 VITALS — BP 134/80 | HR 41 | Temp 97.6°F | Ht 73.0 in | Wt 237.0 lb

## 2018-08-24 DIAGNOSIS — R739 Hyperglycemia, unspecified: Secondary | ICD-10-CM | POA: Diagnosis not present

## 2018-08-24 DIAGNOSIS — Z0000 Encounter for general adult medical examination without abnormal findings: Secondary | ICD-10-CM

## 2018-08-24 LAB — POC URINALSYSI DIPSTICK (AUTOMATED)
Glucose, UA: NEGATIVE
Ketones, UA: NEGATIVE
Leukocytes, UA: NEGATIVE
Nitrite, UA: NEGATIVE
Protein, UA: POSITIVE — AB
Spec Grav, UA: >=1.03 — AB (ref 1.010–1.025)
Urobilinogen, UA: 2 E.U./dL — AB
pH, UA: 6 (ref 5.0–8.0)

## 2018-08-24 LAB — CBC WITH DIFFERENTIAL/PLATELET
Basophils Absolute: 0.1 10*3/uL (ref 0.0–0.1)
Basophils Relative: 1 % (ref 0.0–3.0)
Eosinophils Absolute: 0.4 10*3/uL (ref 0.0–0.7)
Eosinophils Relative: 5.6 % — ABNORMAL HIGH (ref 0.0–5.0)
HCT: 43.2 % (ref 39.0–52.0)
Hemoglobin: 14.6 g/dL (ref 13.0–17.0)
Lymphocytes Relative: 22.6 % (ref 12.0–46.0)
Lymphs Abs: 1.5 10*3/uL (ref 0.7–4.0)
MCHC: 33.7 g/dL (ref 30.0–36.0)
MCV: 85.9 fl (ref 78.0–100.0)
Monocytes Absolute: 0.6 10*3/uL (ref 0.1–1.0)
Monocytes Relative: 8.4 % (ref 3.0–12.0)
Neutro Abs: 4.3 10*3/uL (ref 1.4–7.7)
Neutrophils Relative %: 62.4 % (ref 43.0–77.0)
Platelets: 193 10*3/uL (ref 150.0–400.0)
RBC: 5.03 Mil/uL (ref 4.22–5.81)
RDW: 14.2 % (ref 11.5–15.5)
WBC: 6.8 10*3/uL (ref 4.0–10.5)

## 2018-08-24 LAB — PSA: PSA: 2.02 ng/mL (ref 0.10–4.00)

## 2018-08-24 LAB — LIPID PANEL
Cholesterol: 132 mg/dL (ref 0–200)
HDL: 41.1 mg/dL (ref >39.00)
LDL Cholesterol: 72 mg/dL (ref 0–99)
NonHDL: 90.87
Total CHOL/HDL Ratio: 3
Triglycerides: 92 mg/dL (ref 0.0–149.0)
VLDL: 18.4 mg/dL (ref 0.0–40.0)

## 2018-08-24 LAB — BASIC METABOLIC PANEL
BUN: 21 mg/dL (ref 6–23)
CO2: 31 mEq/L (ref 19–32)
Calcium: 8.7 mg/dL (ref 8.4–10.5)
Chloride: 102 mEq/L (ref 96–112)
Creatinine, Ser: 0.97 mg/dL (ref 0.40–1.50)
GFR: 77.15 mL/min (ref >60.00)
Glucose, Bld: 118 mg/dL — ABNORMAL HIGH (ref 70–99)
Potassium: 4 mEq/L (ref 3.5–5.1)
Sodium: 139 mEq/L (ref 135–145)

## 2018-08-24 LAB — HEPATIC FUNCTION PANEL
ALT: 17 U/L (ref 0–53)
AST: 16 U/L (ref 0–37)
Albumin: 3.9 g/dL (ref 3.5–5.2)
Alkaline Phosphatase: 66 U/L (ref 39–117)
Bilirubin, Direct: 0.2 mg/dL (ref 0.0–0.3)
Total Bilirubin: 0.9 mg/dL (ref 0.2–1.2)
Total Protein: 5.9 g/dL — ABNORMAL LOW (ref 6.0–8.3)

## 2018-08-24 LAB — HEMOGLOBIN A1C: Hgb A1c MFr Bld: 7 % — ABNORMAL HIGH (ref 4.6–6.5)

## 2018-08-24 LAB — TSH: TSH: 2.08 u[IU]/mL (ref 0.35–4.50)

## 2018-08-24 MED ORDER — ASPIRIN EC 81 MG PO TBEC: 81.0000 mg | DELAYED_RELEASE_TABLET | Freq: Every day | ORAL | 0 refills | Status: DC

## 2018-08-24 MED ORDER — LISINOPRIL 5 MG PO TABS: 5.0000 mg | ORAL_TABLET | Freq: Every day | ORAL | 3 refills | Status: DC

## 2018-08-24 MED ORDER — SILDENAFIL CITRATE 20 MG PO TABS: ORAL_TABLET | ORAL | 11 refills | Status: DC

## 2018-08-24 MED ORDER — ATENOLOL 25 MG PO TABS: 25.0000 mg | ORAL_TABLET | Freq: Two times a day (BID) | ORAL | 3 refills | Status: DC

## 2018-08-24 MED ORDER — ATORVASTATIN CALCIUM 40 MG PO TABS: 40.0000 mg | ORAL_TABLET | Freq: Every day | ORAL | 3 refills | Status: DC

## 2018-08-24 MED ORDER — VENLAFAXINE HCL ER 75 MG PO CP24: 75.0000 mg | ORAL_CAPSULE | Freq: Every day | ORAL | 3 refills | Status: DC

## 2018-08-24 NOTE — Progress Notes (Signed)
   Subjective:    Patient ID: [name] [name], male    DOB: 12/24/1950, 68 y.o.   MRN: [id number]  HPI Here for a well exam. He feels great. He passed another kidney stone a few months ago. His BP is stable. He remains in sinus rhythm, and he notes his last bout of atrial fibrillation was over a year ago.    Review of Systems  Constitutional: Negative.   HENT: Negative.   Eyes: Negative.   Respiratory: Negative.   Cardiovascular: Negative.   Gastrointestinal: Negative.   Genitourinary: Negative.   Musculoskeletal: Negative.   Skin: Negative.   Neurological: Negative.   Psychiatric/Behavioral: Negative.        Objective:   Physical Exam Constitutional:      General: He is not in acute distress.    Appearance: He is well-developed. He is not diaphoretic.  HENT:     Head: Normocephalic and atraumatic.     Right Ear: External ear normal.     Left Ear: External ear normal.     Nose: Nose normal.     Mouth/Throat:     Pharynx: No oropharyngeal exudate.  Eyes:     General: No scleral icterus.       Right eye: No discharge.        Left eye: No discharge.     Conjunctiva/sclera: Conjunctivae normal.     Pupils: Pupils are equal, round, and reactive to light.  Neck:     Musculoskeletal: Neck supple.     Thyroid: No thyromegaly.     Vascular: No JVD.     Trachea: No tracheal deviation.  Cardiovascular:     Rate and Rhythm: Normal rate and regular rhythm.     Heart sounds: Normal heart sounds. No murmur. No friction rub. No gallop.   Pulmonary:     Effort: Pulmonary effort is normal. No respiratory distress.     Breath sounds: Normal breath sounds. No wheezing or rales.  Chest:     Chest wall: No tenderness.  Abdominal:     General: Bowel sounds are normal. There is no distension.     Palpations: Abdomen is soft. There is no mass.     Tenderness: There is no abdominal tenderness. There is no guarding or rebound.  Genitourinary:    Penis: Normal. No tenderness.    Prostate: Normal.     Rectum: Normal. Guaiac result negative.  Musculoskeletal: Normal range of motion.        General: No tenderness.  Lymphadenopathy:     Cervical: No cervical adenopathy.  Skin:    General: Skin is warm and dry.     Coloration: Skin is not pale.     Findings: No erythema or rash.  Neurological:     Mental Status: He is alert and oriented to person, place, and time.     Cranial Nerves: No cranial nerve deficit.     Motor: No abnormal muscle tone.     Coordination: Coordination normal.     Deep Tendon Reflexes: Reflexes are normal and symmetric. Reflexes normal.  Psychiatric:        Behavior: Behavior normal.        Thought Content: Thought content normal.        Judgment: Judgment normal.           Assessment & Plan:  Well exam. We discussed diet and exercise. Get fasting labs. Alysia Penna, MD

## 2018-08-24 NOTE — Telephone Encounter (Signed)
Pt had called and left a voicemail for Dr. Lovena Le on the [facility] phone - he has faxed over forms for the [facility] to get his license back. Please call him at [phone]

## 2018-08-30 ENCOUNTER — Encounter: Payer: Self-pay | Admitting: *Deleted

## 2018-09-02 ENCOUNTER — Other Ambulatory Visit: Payer: Self-pay

## 2018-09-02 ENCOUNTER — Other Ambulatory Visit: Payer: Medicare HMO

## 2018-09-02 ENCOUNTER — Encounter: Payer: Self-pay | Admitting: Family Medicine

## 2018-09-02 ENCOUNTER — Ambulatory Visit (INDEPENDENT_AMBULATORY_CARE_PROVIDER_SITE_OTHER): Payer: Medicare HMO | Admitting: Family Medicine

## 2018-09-02 ENCOUNTER — Telehealth: Payer: Self-pay | Admitting: General Practice

## 2018-09-02 DIAGNOSIS — Z20822 Contact with and (suspected) exposure to covid-19: Secondary | ICD-10-CM

## 2018-09-02 DIAGNOSIS — J069 Acute upper respiratory infection, unspecified: Secondary | ICD-10-CM | POA: Diagnosis not present

## 2018-09-02 DIAGNOSIS — Z20828 Contact with and (suspected) exposure to other viral communicable diseases: Secondary | ICD-10-CM

## 2018-09-02 DIAGNOSIS — R6889 Other general symptoms and signs: Secondary | ICD-10-CM | POA: Diagnosis not present

## 2018-09-02 NOTE — Telephone Encounter (Signed)
Pt has been scheduled for covid testing  Scheduled with pt directly. Pt was referred by: Martinique, Betty G, MD

## 2018-09-02 NOTE — Addendum Note (Signed)
Addended by: Denman George on: 09/02/2018 11:44 AM   Modules accepted: Orders

## 2018-09-02 NOTE — Progress Notes (Signed)
Virtual Visit via Video Note   I connected with Mr [name] [name] on 09/02/18 at 10:30 AM EDT by a video enabled telemedicine application and verified that I am speaking with the correct person using two identifiers.  Location patient: home Location provider:home office Persons participating in the virtual visit: patient, provider  I discussed the limitations of evaluation and management by telemedicine and the availability of in person appointments. The patient expressed understanding and agreed to proceed.   HPI: Mr Bukhari is a 68 yo male with Hx of allergies,anxiety,atrial fib,and CKD who is concerned about exposure to COVID 19. 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  A few days after meeting contact person started with fever and respiratory symptoms,he was seen on 08/30/18,tested for COVID 19 and was told yesterday that test was positive.  Ms Lehrmann started with sore throat this past weekend,noted some redness. Mild retroocular pressure headache. Mild nasal congestion,attributted to allergies. He denies chills,fever,body aches,cough,dyspnea,wheezing,abdominal pain,N/V,diarrhea,or skin rash. Negative for anosmia and ageustia.  He has not taken OTC treatments.  Today he is feeling better. Sore throat improved. Negative for dysphagia and stridor.   ROS: See pertinent positives and negatives per HPI.  Past Medical History:  Diagnosis Date  . Allergy   . Anxiety   . Arthritis   . Atrial fibrillation [facility])    sees Dr. Lovena Le  . Cataract    forming  . Chronic kidney disease    kidney stone  . Depression   . Dizziness    or vertigo  . Dysrhythmia    atrial fibrillation, once or twice a year  . GERD (gastroesophageal reflux disease)   . Headache    eyglass flicker in eye is migraine symptoms  . History of kidney stones   . HLD (hyperlipidemia)   . HTN (hypertension)   . Osteopenia   . Retinal tear   .  Sinus bradycardia     Past Surgical History:  Procedure Laterality Date  . CARDIAC CATHETERIZATION  2003   normal  . COLONOSCOPY  01/04/2018   per Dr. Fuller Plan, adenomatous  polyp, repeat in 5 years  . EXTRACORPOREAL SHOCK WAVE LITHOTRIPSY Left 01/21/2018   Procedure: EXTRACORPOREAL SHOCK WAVE LITHOTRIPSY (ESWL);  Surgeon: Hollice Espy, MD;  Location: [facility] ORS;  Service: Urology;  Laterality: Left;  . EYE SURGERY     retinal  . POLYPECTOMY    . RETINAL LASER PROCEDURE Right    repair retinal tear   . WART REMOVAL     LEFT UPPER ARM  . WISDOM TOOTH EXTRACTION      Family History  Problem Relation Age of Onset  . Hodgkin's lymphoma Father   . Hyperlipidemia Other   . Hypertension Mother   . Stroke Mother   . Heart attack Brother   . Prostate cancer Brother        1st degree relative <50  . Diabetes Brother        1st degree relative  . Multiple myeloma Brother   . Hypertension Brother   . Heart disease Brother   . Heart attack Brother   . Colon polyps Brother   . Colon cancer Neg Hx   . Esophageal cancer Neg Hx   . Rectal cancer Neg Hx   . Stomach cancer Neg Hx     Social History   Socioeconomic History  . Marital status: Married    Spouse name: nancy  .  Number of children: Not on file  . Years of education: Not on file  . Highest education level: Not on file  Occupational History  . Occupation: Multimedia programmer for [employer]  . Financial resource strain: Not on file  . Food insecurity    Worry: Not on file    Inability: Not on file  . Transportation needs    Medical: Not on file    Non-medical: Not on file  Tobacco Use  . Smoking status: Former Smoker    Types: Cigarettes    Quit date: 03/11/1975    Years since quitting: 43.5  . Smokeless tobacco: Never Used  Substance and Sexual Activity  . Alcohol use: Yes    Alcohol/week: 0.0 standard drinks    Comment: couple times a month-SOCIALLY 1 BEER A MONTH  . Drug use: No  . Sexual activity: Yes     Birth control/protection: None  Lifestyle  . Physical activity    Days per week: Not on file    Minutes per session: Not on file  . Stress: Not on file  Relationships  . Social Herbalist on phone: Not on file    Gets together: Not on file    Attends religious service: Not on file    Active member of club or organization: Not on file    Attends meetings of clubs or organizations: Not on file    Relationship status: Not on file  . Intimate partner violence    Fear of current or ex partner: Not on file    Emotionally abused: Not on file    Physically abused: Not on file    Forced sexual activity: Not on file  Other Topics Concern  . Not on file  Social History Narrative   Occupation: Optometrist.      Current Outpatient Medications:  .  aspirin EC 81 MG tablet, Take 1 tablet (81 mg total) by mouth daily., Disp: 1 tablet, Rfl: 0 .  atenolol (TENORMIN) 25 MG tablet, Take 1 tablet (25 mg total) by mouth 2 (two) times daily., Disp: 180 tablet, Rfl: 3 .  atorvastatin (LIPITOR) 40 MG tablet, Take 1 tablet (40 mg total) by mouth daily., Disp: 90 tablet, Rfl: 3 .  flecainide (TAMBOCOR) 100 MG tablet, May take 2 tablets daily as needed for breakthrough a-fib (Patient taking differently: Take 200 mg by mouth daily as needed (breakthrough a-fib). ), Disp: 30 tablet, Rfl: 5 .  fluticasone (FLONASE) 50 MCG/ACT nasal spray, Place 2 sprays into both nostrils daily as needed for allergies. , Disp: , Rfl:  .  lisinopril (ZESTRIL) 5 MG tablet, Take 1 tablet (5 mg total) by mouth daily., Disp: 90 tablet, Rfl: 3 .  Propylene Glycol (SYSTANE BALANCE OP), Place 1 drop into both eyes daily as needed (dry eyes)., Disp: , Rfl:  .  sildenafil (REVATIO) 20 MG tablet, Take 5 tabs as needed, Disp: 50 tablet, Rfl: 11 .  venlafaxine XR (EFFEXOR-XR) 75 MG 24 hr capsule, Take 1 capsule (75 mg total) by mouth daily with breakfast., Disp: 90 capsule, Rfl: 3 No current facility-administered medications for  this visit.   Facility-Administered Medications Ordered in Other Visits:  .  ceFAZolin (ANCEF) IVPB 2g/100 mL premix, 2 g, Intravenous, 30 min Pre-Op, Stoioff, Ronda Fairly, MD  EXAM:  VITALS per patient if applicable:N/A  GENERAL: alert, oriented, appears well and in no acute distress  HEENT: atraumatic,normocephalic, conjunctiva clear, no obvious abnormalities on inspection of external nose and  ears  LUNGS: on inspection no signs of respiratory distress, breathing rate appears normal, no obvious gross SOB, gasping or wheezing  CV: no obvious cyanosis  MS: moves all visible extremities without noticeable abnormality  PSYCH/NEURO: pleasant and cooperative, no obvious depression or anxiety, speech and thought processing grossly intact  ASSESSMENT AND PLAN:  Discussed the following assessment and plan:  URI, acute  Symptoms improved. Continue symptomatic treatment as needed.  Exposure to Covid-19 Virus - Plan: Continue monitoring for new symptoms. Social distancing/quarentine for 14 days since date of exposure. COVID 19 test will be arranged. Clearly instructed about warning signs.   15 min face to face OV. > 50% was dedicated to discussion of differential  Dx's, prognosis. Educated about COVID 19 incubation period,signs,and symptoms.   I discussed the assessment and treatment plan with the patient. He was provided an opportunity to ask questions and all were answered. The patient agreed with the plan and demonstrated an understanding of the instructions.   The patient was advised to call back or seek an in-person evaluation if the symptoms worsen or if the condition fails to improve as anticipated.  Return if symptoms worsen or fail to improve.    Fard Borunda Martinique, MD

## 2018-09-06 LAB — NOVEL CORONAVIRUS, NAA: SARS-CoV-2, NAA: NOT DETECTED

## 2018-10-20 ENCOUNTER — Other Ambulatory Visit: Payer: Self-pay | Admitting: Family Medicine

## 2018-11-11 ENCOUNTER — Other Ambulatory Visit: Payer: Self-pay | Admitting: Family Medicine

## 2019-03-14 ENCOUNTER — Other Ambulatory Visit: Payer: Self-pay | Admitting: Urology

## 2019-03-14 DIAGNOSIS — N201 Calculus of ureter: Secondary | ICD-10-CM

## 2019-05-19 DIAGNOSIS — L814 Other melanin hyperpigmentation: Secondary | ICD-10-CM | POA: Diagnosis not present

## 2019-05-19 DIAGNOSIS — D225 Melanocytic nevi of trunk: Secondary | ICD-10-CM | POA: Diagnosis not present

## 2019-05-19 DIAGNOSIS — Z1283 Encounter for screening for malignant neoplasm of skin: Secondary | ICD-10-CM | POA: Diagnosis not present

## 2019-05-19 DIAGNOSIS — L219 Seborrheic dermatitis, unspecified: Secondary | ICD-10-CM | POA: Diagnosis not present

## 2019-05-19 DIAGNOSIS — D2230 Melanocytic nevi of unspecified part of face: Secondary | ICD-10-CM | POA: Diagnosis not present

## 2019-05-19 DIAGNOSIS — L578 Other skin changes due to chronic exposure to nonionizing radiation: Secondary | ICD-10-CM | POA: Diagnosis not present

## 2019-05-19 DIAGNOSIS — L400 Psoriasis vulgaris: Secondary | ICD-10-CM | POA: Diagnosis not present

## 2019-05-19 DIAGNOSIS — L821 Other seborrheic keratosis: Secondary | ICD-10-CM | POA: Diagnosis not present

## 2019-05-19 DIAGNOSIS — L820 Inflamed seborrheic keratosis: Secondary | ICD-10-CM | POA: Diagnosis not present

## 2019-07-28 ENCOUNTER — Telehealth: Payer: Self-pay | Admitting: Family Medicine

## 2019-07-28 NOTE — Telephone Encounter (Signed)
Patient is aware and will drop of the forms one day this week

## 2019-07-28 NOTE — Telephone Encounter (Signed)
Pt is calling asking if Dr. Sarajane Jews would fill out a Cardiac form that will clear him from [facility].  Pt stated that he called the cardiologist and they said they will not fill the form out since they have not seen him lately and they need a new referral.  So he would like to see if Dr. Sarajane Jews can fill it out within the next few weeks and if Dr. Sarajane Jews is not able to fill it out if he can get a referral to a Cardiologist within the next few weeks?  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

## 2019-07-28 NOTE — Telephone Encounter (Signed)
I saw him last year. I would be happy to sign this form for him

## 2019-07-29 ENCOUNTER — Telehealth: Payer: Self-pay | Admitting: Family Medicine

## 2019-07-29 NOTE — Telephone Encounter (Signed)
Form has been faxed. Patient is aware.

## 2019-07-29 NOTE — Telephone Encounter (Signed)
Pt came in dropped off Cardiology Assessment for DOT Certification forms. Upon completion pt would like forms to be faxed to [phone] Attn: Silverio Decamp. Placed in providers folder.

## 2019-07-29 NOTE — Telephone Encounter (Signed)
The form is ready  

## 2019-08-30 ENCOUNTER — Encounter: Payer: Self-pay | Admitting: Family Medicine

## 2019-08-30 ENCOUNTER — Ambulatory Visit (INDEPENDENT_AMBULATORY_CARE_PROVIDER_SITE_OTHER): Payer: Medicare HMO | Admitting: Family Medicine

## 2019-08-30 ENCOUNTER — Other Ambulatory Visit: Payer: Self-pay

## 2019-08-30 VITALS — BP 124/64 | HR 57 | Temp 97.8°F | Ht 73.0 in | Wt 239.4 lb

## 2019-08-30 DIAGNOSIS — Z0000 Encounter for general adult medical examination without abnormal findings: Secondary | ICD-10-CM

## 2019-08-30 LAB — BASIC METABOLIC PANEL
BUN: 20 mg/dL (ref 6–23)
CO2: 33 mEq/L — ABNORMAL HIGH (ref 19–32)
Calcium: 8.7 mg/dL (ref 8.4–10.5)
Chloride: 102 mEq/L (ref 96–112)
Creatinine, Ser: 1.02 mg/dL (ref 0.40–1.50)
GFR: 72.58 mL/min (ref >60.00)
Glucose, Bld: 133 mg/dL — ABNORMAL HIGH (ref 70–99)
Potassium: 4.5 mEq/L (ref 3.5–5.1)
Sodium: 138 mEq/L (ref 135–145)

## 2019-08-30 LAB — CBC WITH DIFFERENTIAL/PLATELET
Basophils Absolute: 0.1 10*3/uL (ref 0.0–0.1)
Basophils Relative: 1.1 % (ref 0.0–3.0)
Eosinophils Absolute: 0.4 10*3/uL (ref 0.0–0.7)
Eosinophils Relative: 5.4 % — ABNORMAL HIGH (ref 0.0–5.0)
HCT: 41.3 % (ref 39.0–52.0)
Hemoglobin: 13.9 g/dL (ref 13.0–17.0)
Lymphocytes Relative: 19.3 % (ref 12.0–46.0)
Lymphs Abs: 1.4 10*3/uL (ref 0.7–4.0)
MCHC: 33.7 g/dL (ref 30.0–36.0)
MCV: 85.1 fl (ref 78.0–100.0)
Monocytes Absolute: 0.5 10*3/uL (ref 0.1–1.0)
Monocytes Relative: 7.6 % (ref 3.0–12.0)
Neutro Abs: 4.8 10*3/uL (ref 1.4–7.7)
Neutrophils Relative %: 66.6 % (ref 43.0–77.0)
Platelets: 192 10*3/uL (ref 150.0–400.0)
RBC: 4.86 Mil/uL (ref 4.22–5.81)
RDW: 14.6 % (ref 11.5–15.5)
WBC: 7.2 10*3/uL (ref 4.0–10.5)

## 2019-08-30 LAB — HEPATIC FUNCTION PANEL
ALT: 18 U/L (ref 0–53)
AST: 17 U/L (ref 0–37)
Albumin: 3.9 g/dL (ref 3.5–5.2)
Alkaline Phosphatase: 85 U/L (ref 39–117)
Bilirubin, Direct: 0.2 mg/dL (ref 0.0–0.3)
Total Bilirubin: 1 mg/dL (ref 0.2–1.2)
Total Protein: 6 g/dL (ref 6.0–8.3)

## 2019-08-30 LAB — LIPID PANEL
Cholesterol: 147 mg/dL (ref 0–200)
HDL: 42.2 mg/dL (ref >39.00)
LDL Cholesterol: 80 mg/dL (ref 0–99)
NonHDL: 105.29
Total CHOL/HDL Ratio: 3
Triglycerides: 128 mg/dL (ref 0.0–149.0)
VLDL: 25.6 mg/dL (ref 0.0–40.0)

## 2019-08-30 LAB — PSA: PSA: 1.75 ng/mL (ref 0.10–4.00)

## 2019-08-30 LAB — TSH: TSH: 2.24 u[IU]/mL (ref 0.35–4.50)

## 2019-08-30 LAB — HEMOGLOBIN A1C: Hgb A1c MFr Bld: 7.2 % — ABNORMAL HIGH (ref 4.6–6.5)

## 2019-08-30 MED ORDER — SILDENAFIL CITRATE 20 MG PO TABS: ORAL_TABLET | ORAL | 11 refills | Status: DC

## 2019-08-30 MED ORDER — VENLAFAXINE HCL ER 37.5 MG PO CP24: 37.5000 mg | ORAL_CAPSULE | Freq: Every day | ORAL | 2 refills | Status: DC

## 2019-08-30 NOTE — Progress Notes (Signed)
   Subjective:    Patient ID: [name] [name], male    DOB: [birth year], 69 y.o.   MRN: [id number]  HPI Here for a well exam. He feels well. He has not felt any atrial fibrillation for several years. He has semi-retired now.    Review of Systems  Constitutional: Negative.   HENT: Negative.   Eyes: Negative.   Respiratory: Negative.   Cardiovascular: Negative.   Gastrointestinal: Negative.   Genitourinary: Negative.   Musculoskeletal: Negative.   Skin: Negative.   Neurological: Negative.   Psychiatric/Behavioral: Negative.        Objective:   Physical Exam Constitutional:      General: He is not in acute distress.    Appearance: He is well-developed. He is not diaphoretic.  HENT:     Head: Normocephalic and atraumatic.     Right Ear: External ear normal.     Left Ear: External ear normal.     Nose: Nose normal.     Mouth/Throat:     Pharynx: No oropharyngeal exudate.  Eyes:     General: No scleral icterus.       Right eye: No discharge.        Left eye: No discharge.     Conjunctiva/sclera: Conjunctivae normal.     Pupils: Pupils are equal, round, and reactive to light.  Neck:     Thyroid: No thyromegaly.     Vascular: No JVD.     Trachea: No tracheal deviation.  Cardiovascular:     Rate and Rhythm: Normal rate and regular rhythm.     Heart sounds: Normal heart sounds. No murmur heard.  No friction rub. No gallop.   Pulmonary:     Effort: Pulmonary effort is normal. No respiratory distress.     Breath sounds: Normal breath sounds. No wheezing or rales.  Chest:     Chest wall: No tenderness.  Abdominal:     General: Bowel sounds are normal. There is no distension.     Palpations: Abdomen is soft. There is no mass.     Tenderness: There is no abdominal tenderness. There is no guarding or rebound.  Genitourinary:    Penis: Normal. No tenderness.      Testes: Normal.     Prostate: Normal.     Rectum: Normal. Guaiac result negative.  Musculoskeletal:         General: No tenderness. Normal range of motion.     Cervical back: Neck supple.  Lymphadenopathy:     Cervical: No cervical adenopathy.  Skin:    General: Skin is warm and dry.     Coloration: Skin is not pale.     Findings: No erythema or rash.  Neurological:     Mental Status: He is alert and oriented to person, place, and time.     Cranial Nerves: No cranial nerve deficit.     Motor: No abnormal muscle tone.     Coordination: Coordination normal.     Deep Tendon Reflexes: Reflexes are normal and symmetric. Reflexes normal.  Psychiatric:        Behavior: Behavior normal.        Thought Content: Thought content normal.        Judgment: Judgment normal.           Assessment & Plan:  Well exam. We discussed diet and exercise. Get fasting labs.  Alysia Penna, MD

## 2019-08-31 ENCOUNTER — Ambulatory Visit: Payer: Medicare HMO | Admitting: Dermatology

## 2019-08-31 ENCOUNTER — Other Ambulatory Visit: Payer: Self-pay

## 2019-08-31 DIAGNOSIS — L578 Other skin changes due to chronic exposure to nonionizing radiation: Secondary | ICD-10-CM

## 2019-08-31 DIAGNOSIS — L918 Other hypertrophic disorders of the skin: Secondary | ICD-10-CM

## 2019-08-31 DIAGNOSIS — L820 Inflamed seborrheic keratosis: Secondary | ICD-10-CM

## 2019-08-31 DIAGNOSIS — D1800 Hemangioma unspecified site: Secondary | ICD-10-CM

## 2019-08-31 DIAGNOSIS — L821 Other seborrheic keratosis: Secondary | ICD-10-CM | POA: Diagnosis not present

## 2019-08-31 DIAGNOSIS — D485 Neoplasm of uncertain behavior of skin: Secondary | ICD-10-CM | POA: Diagnosis not present

## 2019-08-31 DIAGNOSIS — D492 Neoplasm of unspecified behavior of bone, soft tissue, and skin: Secondary | ICD-10-CM

## 2019-08-31 DIAGNOSIS — L409 Psoriasis, unspecified: Secondary | ICD-10-CM | POA: Diagnosis not present

## 2019-08-31 DIAGNOSIS — D3617 Benign neoplasm of peripheral nerves and autonomic nervous system of trunk, unspecified: Secondary | ICD-10-CM | POA: Diagnosis not present

## 2019-08-31 MED ORDER — FLUOCINOLONE ACETONIDE 0.01 % OT OIL: 1.0000 [drp] | TOPICAL_OIL | OTIC | 1 refills | Status: DC

## 2019-08-31 NOTE — Progress Notes (Signed)
Follow-Up Visit   Subjective  [name] [name] is a 69 y.o. male who presents for the following: Psoriasis (R ear, 48m f/u mometasone cr prn) and check spots (under R eye, bil cheeks, chest, irritated).  The following portions of the chart were reviewed this encounter and updated as appropriate:  Tobacco  Allergies  Meds  Problems  Med Hx  Surg Hx  Fam Hx      Review of Systems:  No other skin or systemic complaints except as noted in HPI or Assessment and Plan.  Objective  Well appearing patient in no apparent distress; mood and affect are within normal limits.  A focused examination was performed including face, ears, trunk. Relevant physical exam findings are noted in the Assessment and Plan.  Objective  Right Ear: Scale external canal  Objective  face x 22, scalp x 1 (23): Erythematous keratotic or waxy stuck-on papule or plaque.   Objective  L medial pectoral: 0.4cm pink pap  Objective  Bil axilla (30): Fleshy, skin-colored pedunculated papules.     Assessment & Plan    Hemangiomas - Red papules - Discussed benign nature - Observe - Call for any changes  Actinic Damage - diffuse scaly erythematous macules with underlying dyspigmentation - Recommend daily broad spectrum sunscreen SPF 30+ to sun-exposed areas, reapply every 2 hours as needed.  - Call for new or changing lesions.  Seborrheic Keratoses - Stuck-on, waxy, tan-brown papules and plaques  - Discussed benign etiology and prognosis. - Observe - Call for any changes  Psoriasis Right Ear  Start Dermotic oil 1-2gtts 3d/wk prn flares  Fluocinolone Acetonide (DERMOTIC) 0.01 % OIL - Right Ear  Inflamed seborrheic keratosis (23) face x 22, scalp x 1  Destruction of lesion - face x 22, scalp x 1 Complexity: simple   Destruction method: cryotherapy   Informed consent: discussed and consent obtained   Timeout:  patient name, date of birth, surgical site, and procedure verified Lesion  destroyed using liquid nitrogen: Yes   Region frozen until ice ball extended beyond lesion: Yes   Outcome: patient tolerated procedure well with no complications   Post-procedure details: wound care instructions given    Neoplasm of skin L medial pectoral  Epidermal / dermal shaving  Lesion diameter (cm):  0.4 Informed consent: discussed and consent obtained   Timeout: patient name, date of birth, surgical site, and procedure verified   Procedure prep:  Patient was prepped and draped in usual sterile fashion Prep type:  Isopropyl alcohol Anesthesia: the lesion was anesthetized in a standard fashion   Anesthetic:  1% lidocaine w/ epinephrine 1-100,000 buffered w/ 8.4% NaHCO3 Instrument used: flexible razor blade   Hemostasis achieved with: pressure, aluminum chloride and electrodesiccation   Outcome: patient tolerated procedure well   Post-procedure details: sterile dressing applied and wound care instructions given   Dressing type: bandage and petrolatum   Additional details:  Post txt defect 0.8cm  Epidermal / dermal shaving  Specimen 1 - Surgical pathology Differential Diagnosis: D48.5 Irritated Nevus r/o Dysplasia Check Margins: No 0.4cm pink pap  Skin tag (30) Bil axilla  Irritated  Snip exc x 4,  L axilla x 3, R axilla x 1 Ln2 x 26,  L axilla x 14, R axilla x 12  ABN signed  Return in about 1 year (around 08/30/2020) for TBSE.  I, Othelia Pulling, RMA, am acting as scribe for Sarina Ser, MD .  Documentation: I have reviewed the above documentation for accuracy and completeness, and  I agree with the above.  Sarina Ser, MD

## 2019-08-31 NOTE — Patient Instructions (Signed)

## 2019-09-02 ENCOUNTER — Encounter: Payer: Medicare HMO | Admitting: Family Medicine

## 2019-09-05 ENCOUNTER — Telehealth: Payer: Self-pay

## 2019-09-05 NOTE — Telephone Encounter (Signed)
Patient informed of pathology results 

## 2019-09-05 NOTE — Telephone Encounter (Signed)
-----   Message from Ralene Bathe, MD sent at 09/01/2019  6:50 PM EDT ----- Skin , left medial pectoral NEUROFIBROMA  Benign "mole"

## 2019-09-06 ENCOUNTER — Encounter: Payer: Self-pay | Admitting: Dermatology

## 2019-09-09 DIAGNOSIS — H6123 Impacted cerumen, bilateral: Secondary | ICD-10-CM | POA: Diagnosis not present

## 2019-09-09 DIAGNOSIS — H6063 Unspecified chronic otitis externa, bilateral: Secondary | ICD-10-CM | POA: Diagnosis not present

## 2019-09-17 ENCOUNTER — Other Ambulatory Visit: Payer: Self-pay | Admitting: Family Medicine

## 2019-10-04 ENCOUNTER — Other Ambulatory Visit: Payer: Self-pay | Admitting: Family Medicine

## 2019-10-06 ENCOUNTER — Other Ambulatory Visit: Payer: Self-pay | Admitting: Family Medicine

## 2019-11-19 ENCOUNTER — Other Ambulatory Visit: Payer: Self-pay | Admitting: Family Medicine

## 2019-12-01 DIAGNOSIS — Z6841 Body Mass Index (BMI) 40.0 and over, adult: Secondary | ICD-10-CM | POA: Diagnosis not present

## 2019-12-01 DIAGNOSIS — R69 Illness, unspecified: Secondary | ICD-10-CM | POA: Diagnosis not present

## 2019-12-01 DIAGNOSIS — J1282 Pneumonia due to coronavirus disease 2019: Secondary | ICD-10-CM | POA: Diagnosis not present

## 2019-12-06 DIAGNOSIS — R69 Illness, unspecified: Secondary | ICD-10-CM | POA: Diagnosis not present

## 2019-12-06 DIAGNOSIS — J1282 Pneumonia due to coronavirus disease 2019: Secondary | ICD-10-CM | POA: Diagnosis not present

## 2019-12-06 DIAGNOSIS — Z6841 Body Mass Index (BMI) 40.0 and over, adult: Secondary | ICD-10-CM | POA: Diagnosis not present

## 2020-02-18 ENCOUNTER — Other Ambulatory Visit: Payer: Self-pay | Admitting: Family Medicine

## 2020-02-22 ENCOUNTER — Telehealth: Payer: Self-pay | Admitting: Family Medicine

## 2020-02-22 MED ORDER — ATORVASTATIN CALCIUM 40 MG PO TABS
40.0000 mg | ORAL_TABLET | Freq: Every day | ORAL | 0 refills | Status: DC
Start: 2020-02-22 — End: 2020-07-03

## 2020-02-22 MED ORDER — ATENOLOL 25 MG PO TABS
25.0000 mg | ORAL_TABLET | Freq: Two times a day (BID) | ORAL | 0 refills | Status: DC
Start: 2020-02-22 — End: 2020-05-22

## 2020-02-22 MED ORDER — LISINOPRIL 5 MG PO TABS
5.0000 mg | ORAL_TABLET | Freq: Every day | ORAL | 0 refills | Status: DC
Start: 2020-02-22 — End: 2020-06-18

## 2020-02-22 NOTE — Telephone Encounter (Signed)
Pt is calling in stating the he is out of the following Rx's atenolol (TENORMIN) 25 MG #90, atorvastatin (LIPITOR) 40 MG and lisinopril (ZESTRIL) 5 MG    Pharm:  [facility] on [address] in [place], [place]

## 2020-02-22 NOTE — Telephone Encounter (Signed)
Rx's sent in. °

## 2020-02-27 ENCOUNTER — Ambulatory Visit (INDEPENDENT_AMBULATORY_CARE_PROVIDER_SITE_OTHER): Payer: Medicare HMO | Admitting: Family Medicine

## 2020-02-27 ENCOUNTER — Other Ambulatory Visit: Payer: Self-pay

## 2020-02-27 ENCOUNTER — Ambulatory Visit (INDEPENDENT_AMBULATORY_CARE_PROVIDER_SITE_OTHER): Payer: Medicare HMO

## 2020-02-27 ENCOUNTER — Encounter: Payer: Self-pay | Admitting: Family Medicine

## 2020-02-27 VITALS — BP 138/70 | HR 54 | Temp 98.0°F | Ht 73.0 in | Wt 233.0 lb

## 2020-02-27 DIAGNOSIS — E559 Vitamin D deficiency, unspecified: Secondary | ICD-10-CM | POA: Diagnosis not present

## 2020-02-27 DIAGNOSIS — E119 Type 2 diabetes mellitus without complications: Secondary | ICD-10-CM

## 2020-02-27 DIAGNOSIS — I10 Essential (primary) hypertension: Secondary | ICD-10-CM

## 2020-02-27 DIAGNOSIS — E538 Deficiency of other specified B group vitamins: Secondary | ICD-10-CM

## 2020-02-27 DIAGNOSIS — R0683 Snoring: Secondary | ICD-10-CM

## 2020-02-27 DIAGNOSIS — R5383 Other fatigue: Secondary | ICD-10-CM

## 2020-02-27 DIAGNOSIS — R0602 Shortness of breath: Secondary | ICD-10-CM

## 2020-02-27 DIAGNOSIS — I480 Paroxysmal atrial fibrillation: Secondary | ICD-10-CM

## 2020-02-27 DIAGNOSIS — H532 Diplopia: Secondary | ICD-10-CM | POA: Diagnosis not present

## 2020-02-27 NOTE — Progress Notes (Signed)
KAYLEE WOMBLES DOB: [birth year] Encounter date: 02/27/2020  This is a 69 y.o. male who presents with No chief complaint on file.   History of present illness:  Has had some struggles last week or two. Exhaustion, shortness of breath, nausea. Has been overworked re-modeling a house for 5 months and not sure what is out of whack.   Hx of a fib on occasion; can tell when it goes out; but wonders if happening more because of fatigue "surges". Hadn't been bad enough in past for intervention/ablation. No chest pain. Notes the SOB on and off when he has episodes - just momentary. Not always while doing something. Not sure if always associated with HR being elevated.   Tried to hit balls at golf range earlier this week and couldn't make it through basket - just felt exhausted.   Came off effexor in June-July. Had been on this for several years.   Working on [employer] and weight loss.   Wife states that he snores. She would like him to have a sleep study done. Wife states that he is tired; falls asleep when still.   Has had some double vision; vertical diplopia; comes and goes; not sure which eye. Reading has been more of a struggle. Has had retinal issues with both eyes. Sees specialist.   Has noted more "getting choked" with eating; states maybe more in last year.    Allergies  Allergen Reactions  . Oxycodone Nausea And Vomiting  . Paroxetine Nausea And Vomiting and Other (See Comments)    Altered mental state  . Sulfa Antibiotics Hives   Current Meds  Medication Sig  . aspirin EC 81 MG tablet Take 1 tablet (81 mg total) by mouth daily.  Marland Kitchen atenolol (TENORMIN) 25 MG tablet Take 1 tablet (25 mg total) by mouth 2 (two) times daily.  Marland Kitchen atorvastatin (LIPITOR) 40 MG tablet Take 1 tablet (40 mg total) by mouth daily.  Marland Kitchen CALCIUM PO Take by mouth.  . flecainide (TAMBOCOR) 100 MG tablet May take 2 tablets daily as needed for breakthrough a-fib (Patient taking differently: Take 200 mg  by mouth daily as needed (breakthrough a-fib).)  . Fluocinolone Acetonide (DERMOTIC) 0.01 % OIL Place 1-2 drops in ear(s) 3 (three) times a week. 1-2 gtts to aa ears for psoriasis up to 3 days a week prn flares  . fluticasone (FLONASE) 50 MCG/ACT nasal spray Place 2 sprays into both nostrils daily as needed for allergies.   Marland Kitchen lisinopril (ZESTRIL) 5 MG tablet Take 1 tablet (5 mg total) by mouth daily.  Marland Kitchen Propylene Glycol (SYSTANE BALANCE OP) Place 1 drop into both eyes daily as needed (dry eyes).  . sildenafil (REVATIO) 20 MG tablet Take 5 tabs as needed    Review of Systems  Constitutional: Positive for fatigue. Negative for chills, fever and unexpected weight change.  HENT: Trouble swallowing: when asked; notes occasional choking with food.   Eyes: Positive for visual disturbance (when asked notes more difficult to read; has to focus harder; vertical double vision noted on a few occasions).  Respiratory: Positive for shortness of breath. Negative for cough, chest tightness and wheezing.   Cardiovascular: Positive for palpitations. Negative for chest pain and leg swelling.  Gastrointestinal: Positive for nausea. Negative for abdominal pain, blood in stool, constipation, diarrhea and vomiting.  Genitourinary: Negative for difficulty urinating and dysuria.  Musculoskeletal: Negative for arthralgias.  Neurological: Negative for dizziness, tremors, weakness, light-headedness and headaches. Numbness: only on bottoms of feet; slight.  Objective:  BP 138/70 (BP Location: Left Arm, Patient Position: Sitting, Cuff Size: Large)   Pulse (!) 54   Temp 98 F (36.7 C) (Oral)   Ht 6\' 1"  (1.854 m)   Wt 233 lb (105.7 kg)   SpO2 99%   BMI 30.74 kg/m   Weight: 233 lb (105.7 kg)   BP Readings from Last 3 Encounters:  02/27/20 138/70  08/30/19 124/64  08/24/18 134/80   Wt Readings from Last 3 Encounters:  02/27/20 233 lb (105.7 kg)  08/30/19 239 lb 6.4 oz (108.6 kg)  08/24/18 237 lb (107.5 kg)     Physical Exam Constitutional:      General: He is not in acute distress.    Appearance: He is well-developed and well-nourished. He is not diaphoretic.  HENT:     Head: Normocephalic and atraumatic.     Right Ear: External ear normal.     Left Ear: External ear normal.     Mouth/Throat:     Mouth: Oropharynx is clear and moist.  Eyes:     Conjunctiva/sclera: Conjunctivae normal.     Pupils: Pupils are equal, round, and reactive to light.  Neck:     Thyroid: No thyromegaly.  Cardiovascular:     Rate and Rhythm: Normal rate and regular rhythm.     Heart sounds: Normal heart sounds. No murmur heard. No friction rub. No gallop.   Pulmonary:     Effort: Pulmonary effort is normal. No respiratory distress.     Breath sounds: Normal breath sounds. No wheezing or rales.  Abdominal:     General: Abdomen is flat. Bowel sounds are normal. There is no distension.     Tenderness: There is no abdominal tenderness. There is no guarding.  Musculoskeletal:     Cervical back: Neck supple.  Lymphadenopathy:     Cervical: No cervical adenopathy.  Skin:    General: Skin is warm and dry.  Neurological:     Mental Status: He is alert and oriented to person, place, and time.     Cranial Nerves: No cranial nerve deficit.     Motor: Motor function is intact. No abnormal muscle tone.     Coordination: Romberg sign negative. Coordination normal. Rapid alternating movements normal.     Gait: Gait is intact.     Deep Tendon Reflexes: Strength normal. Reflexes normal.     Reflex Scores:      Tricep reflexes are 2+ on the right side and 2+ on the left side.      Bicep reflexes are 2+ on the right side and 2+ on the left side.      Brachioradialis reflexes are 2+ on the right side and 2+ on the left side.      Patellar reflexes are 2+ on the right side and 2+ on the left side. Psychiatric:        Mood and Affect: Mood and affect normal.        Behavior: Behavior normal.      Assessment/Plan  1. Snoring This is a chronic issue, but since wife brought up concern and it can contribute to increased risk of abnormal heart rhythm as well as long-term side effects of untreated sleep apnea, will refer for study. - Ambulatory referral to Sleep Studies  2. Essential hypertension Blood pressure stable today.  Continue atenolol 25 mg twice daily.  3. Paroxysmal atrial fibrillation (HCC) Seems to be having more episodes and possibly symptomatic episodes.  EKG today shows sinus bradycardia but  no acute changes.  No evidence of ischemia noted on EKG either.  I do think I am getting 48-hour monitor to see what sort of arrhythmias are occurring when patient becomes symptomatic will be helpful.  Further evaluation pending this. - EKG 12-Lead - Holter monitor - 48 hour; Future  4. Diabetes mellitus without complication ([place]) - Hemoglobin A1c; Future - Hemoglobin A1c  5. Fatigue, unspecified type - CBC with Differential/Platelet; Future - Comprehensive metabolic panel; Future - TSH; Future - T4, free; Future - Holter monitor - 48 hour; Future - T4, free - TSH - CBC with Differential/Platelet - Comprehensive metabolic panel  6. Shortness of breath See above.  Short of breath is very momentary and may be associated with irregular heart rhythm. - Holter monitor - 48 hour; Future  7. B12 deficiency - Vitamin B12; Future - Vitamin B12  8. Vitamin D deficiency - VITAMIN D 25 Hydroxy (Vit-D Deficiency, Fractures); Future - VITAMIN D 25 Hydroxy (Vit-D Deficiency, Fractures)  9. Vertical diplopia Encourage patient to complete eye exam, which he was contemplating.  I am not certain if this is related to his other symptoms, but we briefly discussed that there could be a neuromuscular issue going on (like myasthenia gravis) or a systemic issue (thyroid) which could contribute.  Further evaluation pending lab results. - Magnesium, RBC; Future - Vitamin B1; Future -  Acetylcholine receptor, blocking Abs; Future - Vitamin B1 - Magnesium, RBC - Acetylcholine receptor, blocking Abs   Return for pending bloodwork.  45 minutes spent in chart review, time with patient and wife explaining evaluation/planned work-up, exam, and charting.  Micheline Rough, MD

## 2020-02-29 DIAGNOSIS — R0602 Shortness of breath: Secondary | ICD-10-CM

## 2020-02-29 DIAGNOSIS — I480 Paroxysmal atrial fibrillation: Secondary | ICD-10-CM | POA: Diagnosis not present

## 2020-02-29 DIAGNOSIS — R5383 Other fatigue: Secondary | ICD-10-CM

## 2020-02-29 LAB — MAGNESIUM, RBC: Magnesium RBC: 5.7 mg/dL (ref 4.0–6.4)

## 2020-03-01 DIAGNOSIS — J029 Acute pharyngitis, unspecified: Secondary | ICD-10-CM | POA: Diagnosis not present

## 2020-03-01 DIAGNOSIS — Z20822 Contact with and (suspected) exposure to covid-19: Secondary | ICD-10-CM | POA: Diagnosis not present

## 2020-03-01 LAB — VITAMIN B1: Vitamin B1 (Thiamine): 28 nmol/L (ref 8–30)

## 2020-03-06 LAB — COMPREHENSIVE METABOLIC PANEL
AG Ratio: 1.8 (calc) (ref 1.0–2.5)
ALT: 16 U/L (ref 9–46)
AST: 17 U/L (ref 10–35)
Albumin: 4.2 g/dL (ref 3.6–5.1)
Alkaline phosphatase (APISO): 77 U/L (ref 35–144)
BUN: 19 mg/dL (ref 7–25)
CO2: 30 mmol/L (ref 20–32)
Calcium: 9.7 mg/dL (ref 8.6–10.3)
Chloride: 103 mmol/L (ref 98–110)
Creat: 1.04 mg/dL (ref 0.70–1.25)
Globulin: 2.3 g/dL (calc) (ref 1.9–3.7)
Glucose, Bld: 110 mg/dL — ABNORMAL HIGH (ref 65–99)
Potassium: 5.2 mmol/L (ref 3.5–5.3)
Sodium: 139 mmol/L (ref 135–146)
Total Bilirubin: 0.9 mg/dL (ref 0.2–1.2)
Total Protein: 6.5 g/dL (ref 6.1–8.1)

## 2020-03-06 LAB — CBC WITH DIFFERENTIAL/PLATELET
Absolute Monocytes: 531 cells/uL (ref 200–950)
Basophils Absolute: 77 cells/uL (ref 0–200)
Basophils Relative: 1 %
Eosinophils Absolute: 293 cells/uL (ref 15–500)
Eosinophils Relative: 3.8 %
HCT: 42.4 % (ref 38.5–50.0)
Hemoglobin: 14.2 g/dL (ref 13.2–17.1)
Lymphs Abs: 1509 cells/uL (ref 850–3900)
MCH: 28 pg (ref 27.0–33.0)
MCHC: 33.5 g/dL (ref 32.0–36.0)
MCV: 83.6 fL (ref 80.0–100.0)
MPV: 10.7 fL (ref 7.5–12.5)
Monocytes Relative: 6.9 %
Neutro Abs: 5290 cells/uL (ref 1500–7800)
Neutrophils Relative %: 68.7 %
Platelets: 225 10*3/uL (ref 140–400)
RBC: 5.07 10*6/uL (ref 4.20–5.80)
RDW: 13.4 % (ref 11.0–15.0)
Total Lymphocyte: 19.6 %
WBC: 7.7 10*3/uL (ref 3.8–10.8)

## 2020-03-06 LAB — VITAMIN B12: Vitamin B-12: 403 pg/mL (ref 200–1100)

## 2020-03-06 LAB — HEMOGLOBIN A1C
Hgb A1c MFr Bld: 7 % of total Hgb — ABNORMAL HIGH (ref <5.7)
Mean Plasma Glucose: 154 mg/dL
eAG (mmol/L): 8.5 mmol/L

## 2020-03-06 LAB — ACETYLCHOLINE RECEPTOR, BLOCKING: ACHR Blocking Abs: <15 % Inhibition (ref <15)

## 2020-03-06 LAB — VITAMIN D 25 HYDROXY (VIT D DEFICIENCY, FRACTURES): Vit D, 25-Hydroxy: 48 ng/mL (ref 30–100)

## 2020-03-06 LAB — T4, FREE: Free T4: 1.1 ng/dL (ref 0.8–1.8)

## 2020-03-06 LAB — TSH: TSH: 2.03 mIU/L (ref 0.40–4.50)

## 2020-03-13 DIAGNOSIS — I480 Paroxysmal atrial fibrillation: Secondary | ICD-10-CM | POA: Diagnosis not present

## 2020-03-13 DIAGNOSIS — R5383 Other fatigue: Secondary | ICD-10-CM | POA: Diagnosis not present

## 2020-03-13 DIAGNOSIS — R0602 Shortness of breath: Secondary | ICD-10-CM | POA: Diagnosis not present

## 2020-03-14 ENCOUNTER — Telehealth: Payer: Self-pay | Admitting: Family Medicine

## 2020-03-14 DIAGNOSIS — I480 Paroxysmal atrial fibrillation: Secondary | ICD-10-CM

## 2020-03-14 NOTE — Telephone Encounter (Signed)
Patient called and stated that before he can be seen at the cardiologist the provider has to send a referral , please advise. CB is [phone]

## 2020-03-16 NOTE — Telephone Encounter (Signed)
I did the referral to Dr. Crissie Sickles (cardiology)

## 2020-03-19 ENCOUNTER — Encounter: Payer: Self-pay | Admitting: Family Medicine

## 2020-03-20 MED ORDER — VENLAFAXINE HCL ER 37.5 MG PO CP24: 37.5000 mg | ORAL_CAPSULE | Freq: Every day | ORAL | 3 refills | Status: DC

## 2020-03-20 NOTE — Telephone Encounter (Signed)
This was sent in  

## 2020-03-21 DIAGNOSIS — M9907 Segmental and somatic dysfunction of upper extremity: Secondary | ICD-10-CM | POA: Diagnosis not present

## 2020-03-21 DIAGNOSIS — M9901 Segmental and somatic dysfunction of cervical region: Secondary | ICD-10-CM | POA: Diagnosis not present

## 2020-03-21 DIAGNOSIS — M9902 Segmental and somatic dysfunction of thoracic region: Secondary | ICD-10-CM | POA: Diagnosis not present

## 2020-03-21 DIAGNOSIS — M9903 Segmental and somatic dysfunction of lumbar region: Secondary | ICD-10-CM | POA: Diagnosis not present

## 2020-03-22 DIAGNOSIS — M9901 Segmental and somatic dysfunction of cervical region: Secondary | ICD-10-CM | POA: Diagnosis not present

## 2020-03-22 DIAGNOSIS — M9903 Segmental and somatic dysfunction of lumbar region: Secondary | ICD-10-CM | POA: Diagnosis not present

## 2020-03-22 DIAGNOSIS — M9902 Segmental and somatic dysfunction of thoracic region: Secondary | ICD-10-CM | POA: Diagnosis not present

## 2020-03-22 DIAGNOSIS — M9907 Segmental and somatic dysfunction of upper extremity: Secondary | ICD-10-CM | POA: Diagnosis not present

## 2020-03-26 IMAGING — CT CT RENAL STONE PROTOCOL
2 of 4 series · 16 of 46 positions shown, 18 images · non-contrast
Comparison: 11/24/2017

CLINICAL DATA: Flank pain, recurrent urolithiasis

EXAM:
CT ABDOMEN AND PELVIS WITHOUT CONTRAST
TECHNIQUE: Multidetector CT imaging of the abdomen and pelvis was performed
following the standard protocol without IV contrast.

[Series 2: stone full standard · axial · 0.88mm/px · z∈[-836,-372]mm · 13 of 103 slices shown, 15 images]
[im 5/103  soft-tissue]
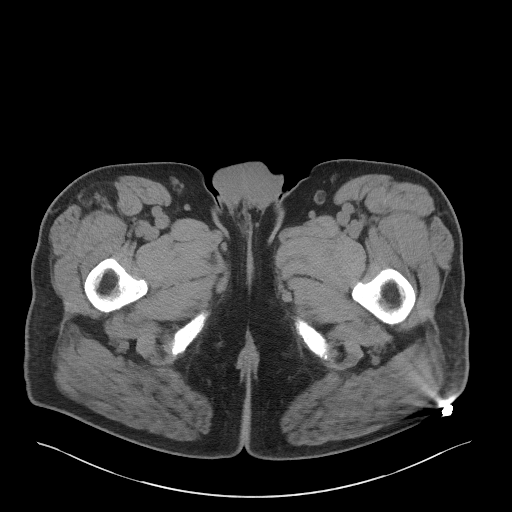
[im 5/103  bone]
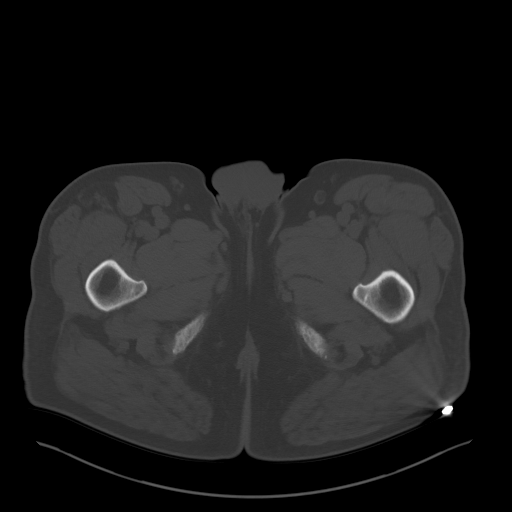
[im 13/103  soft-tissue]
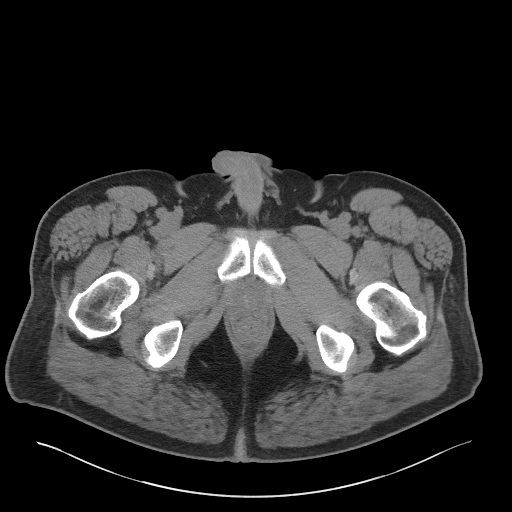
[im 22/103  soft-tissue]
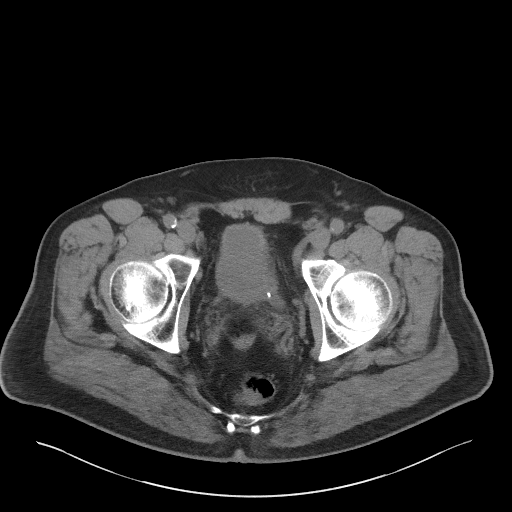
[im 30/103  soft-tissue]
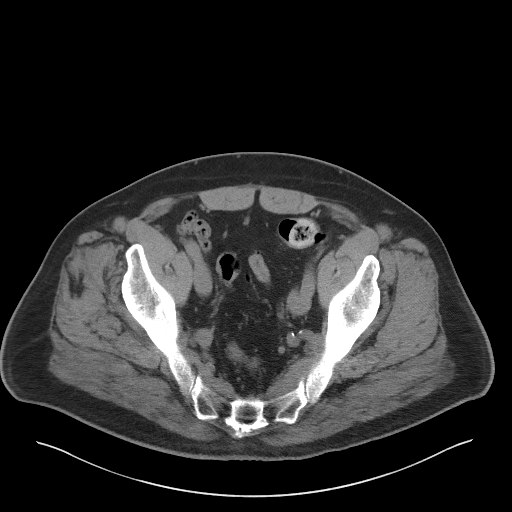
[im 35/103  soft-tissue]
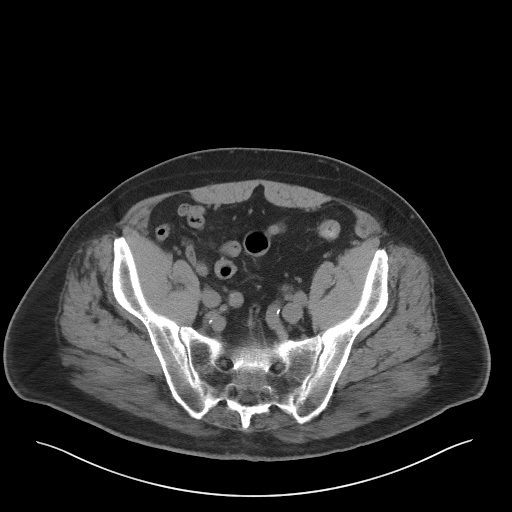
[im 43/103  soft-tissue]
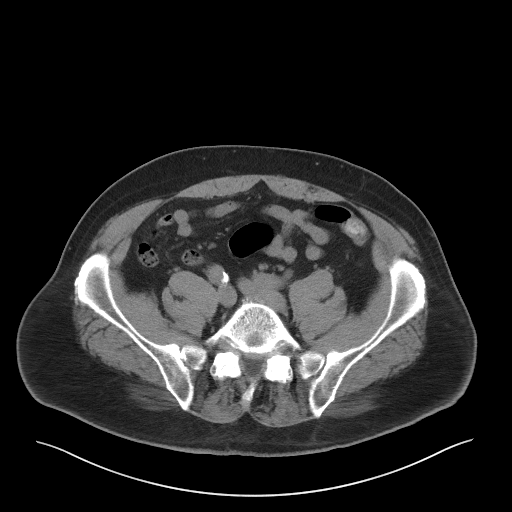
[im 52/103  soft-tissue]
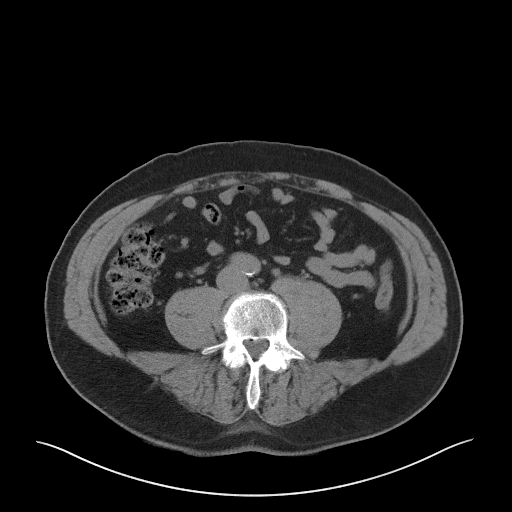
[im 60/103  soft-tissue]
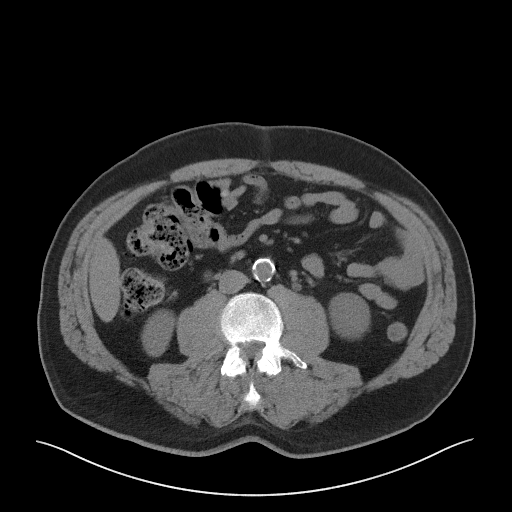
[im 69/103  soft-tissue]
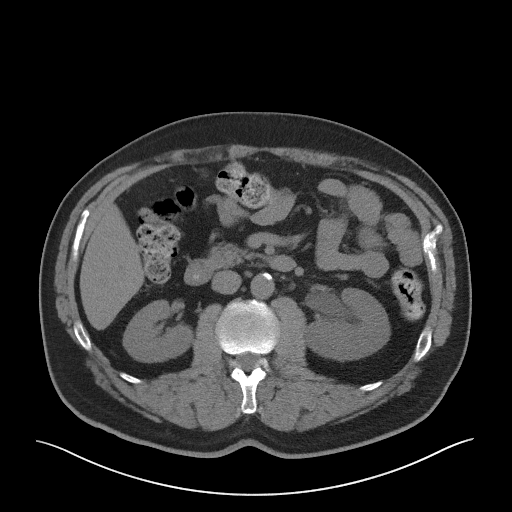
[im 69/103  bone]
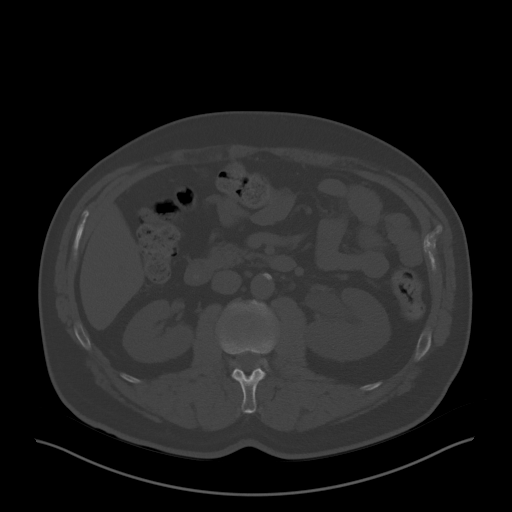
[im 73/103  soft-tissue]
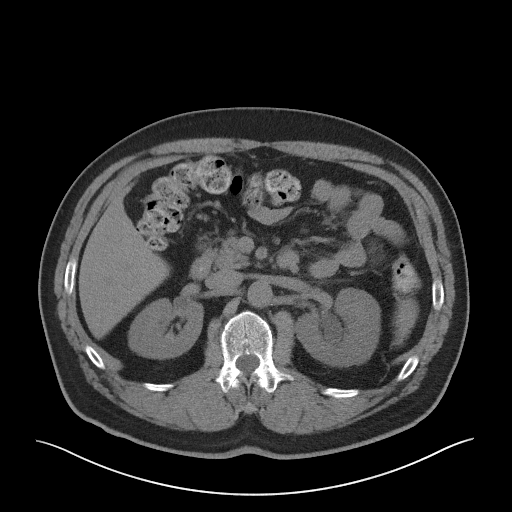
[im 81/103  soft-tissue]
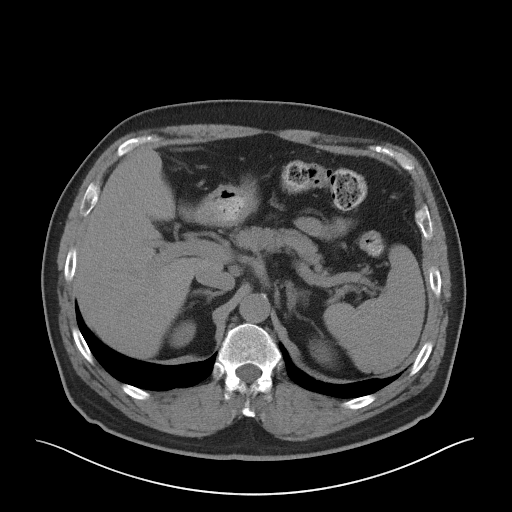
[im 90/103  soft-tissue]
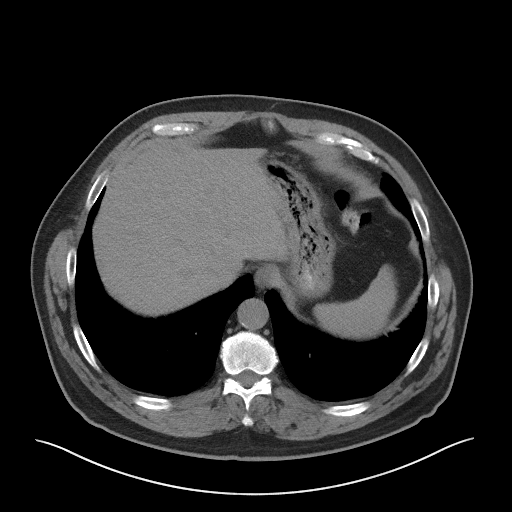
[im 98/103  soft-tissue]
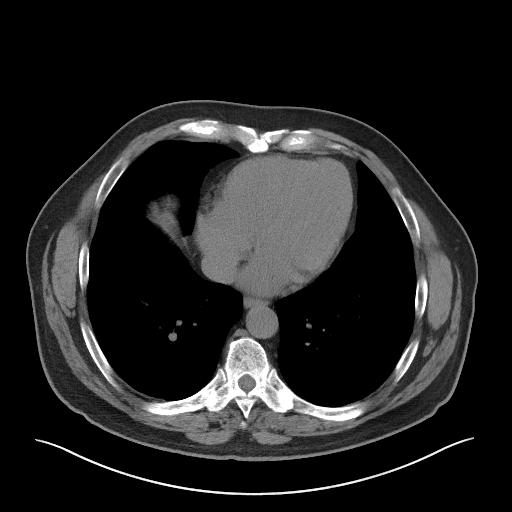

[Series 5: coronal · coronal · 0.85mm/px · 3 of 152 slices shown]
[im 51/152  soft-tissue]
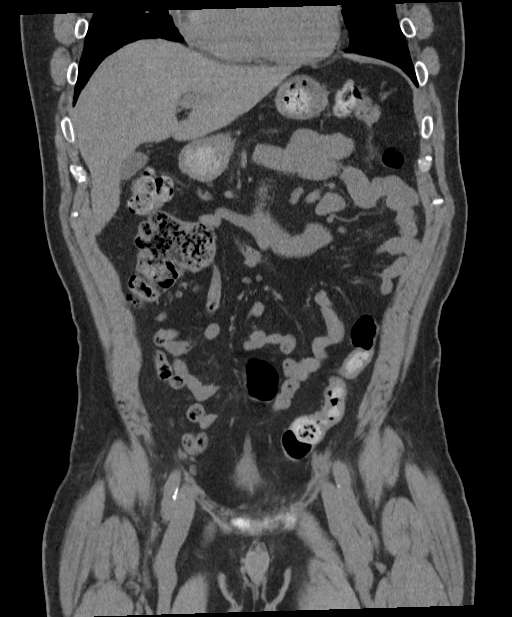
[im 68/152  soft-tissue]
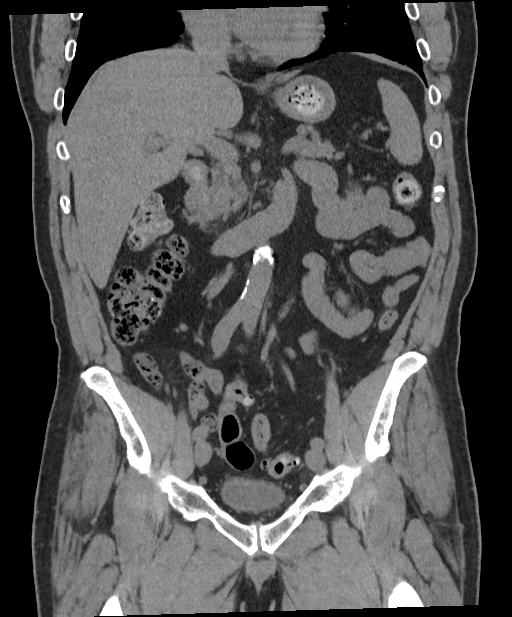
[im 84/152  soft-tissue]
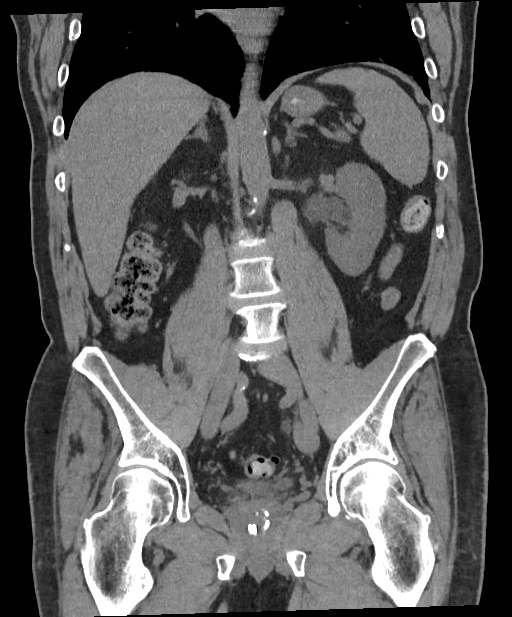

[16 of 46 positions shown; findings below may reference images not displayed]

FINDINGS: Lower chest: No acute abnormality.

Hepatobiliary: No focal liver abnormality is seen. No gallstones,
gallbladder wall thickening, or biliary dilatation.

Pancreas: Unremarkable. No pancreatic ductal dilatation or
surrounding inflammatory changes.

Spleen: Normal in size without focal abnormality.

Adrenals/Urinary Tract: Adrenal glands are unremarkable. 5 mm left
UVJ calculus resulting in moderate left hydroureteronephrosis.
Punctate nonobstructing left renal calculus. No right obstructive
uropathy. Bladder is unremarkable.

Stomach/Bowel: Stomach is within normal limits. Appendix appears
normal. No evidence of bowel wall thickening, distention, or
inflammatory changes.

Vascular/Lymphatic: Abdominal aortic atherosclerosis. Normal caliber
abdominal aorta. No lymphadenopathy.

Reproductive: Prostate is unremarkable.

Other: No abdominal wall hernia or abnormality. No abdominopelvic
ascites.

Musculoskeletal: No acute or significant osseous findings.
IMPRESSION: 1. 5 mm left UVJ calculus resulting in moderate left
hydroureteronephrosis.
2.  Aortic Atherosclerosis ([id number]).

## 2020-03-27 DIAGNOSIS — M9907 Segmental and somatic dysfunction of upper extremity: Secondary | ICD-10-CM | POA: Diagnosis not present

## 2020-03-27 DIAGNOSIS — M9902 Segmental and somatic dysfunction of thoracic region: Secondary | ICD-10-CM | POA: Diagnosis not present

## 2020-03-27 DIAGNOSIS — M9903 Segmental and somatic dysfunction of lumbar region: Secondary | ICD-10-CM | POA: Diagnosis not present

## 2020-03-27 DIAGNOSIS — M9901 Segmental and somatic dysfunction of cervical region: Secondary | ICD-10-CM | POA: Diagnosis not present

## 2020-03-28 DIAGNOSIS — M9902 Segmental and somatic dysfunction of thoracic region: Secondary | ICD-10-CM | POA: Diagnosis not present

## 2020-03-28 DIAGNOSIS — M9901 Segmental and somatic dysfunction of cervical region: Secondary | ICD-10-CM | POA: Diagnosis not present

## 2020-03-28 DIAGNOSIS — M9903 Segmental and somatic dysfunction of lumbar region: Secondary | ICD-10-CM | POA: Diagnosis not present

## 2020-03-28 DIAGNOSIS — M9907 Segmental and somatic dysfunction of upper extremity: Secondary | ICD-10-CM | POA: Diagnosis not present

## 2020-04-02 ENCOUNTER — Ambulatory Visit: Payer: Medicare HMO | Admitting: Internal Medicine

## 2020-04-02 ENCOUNTER — Encounter: Payer: Self-pay | Admitting: Internal Medicine

## 2020-04-02 ENCOUNTER — Other Ambulatory Visit: Payer: Self-pay

## 2020-04-02 VITALS — BP 126/78 | HR 60 | Ht 73.0 in | Wt 227.6 lb

## 2020-04-02 DIAGNOSIS — M9903 Segmental and somatic dysfunction of lumbar region: Secondary | ICD-10-CM | POA: Diagnosis not present

## 2020-04-02 DIAGNOSIS — I10 Essential (primary) hypertension: Secondary | ICD-10-CM | POA: Diagnosis not present

## 2020-04-02 DIAGNOSIS — M9907 Segmental and somatic dysfunction of upper extremity: Secondary | ICD-10-CM | POA: Diagnosis not present

## 2020-04-02 DIAGNOSIS — M9902 Segmental and somatic dysfunction of thoracic region: Secondary | ICD-10-CM | POA: Diagnosis not present

## 2020-04-02 DIAGNOSIS — I480 Paroxysmal atrial fibrillation: Secondary | ICD-10-CM

## 2020-04-02 DIAGNOSIS — M9901 Segmental and somatic dysfunction of cervical region: Secondary | ICD-10-CM | POA: Diagnosis not present

## 2020-04-02 MED ORDER — APIXABAN 5 MG PO TABS: 5.0000 mg | ORAL_TABLET | Freq: Two times a day (BID) | ORAL | 11 refills | Status: DC

## 2020-04-02 NOTE — Patient Instructions (Addendum)
Medication Instructions:  Your physician has recommended you make the following change in your medication:  1.  STOP aspirin  2.  START ELIQUIS 5 mg- Take one tablet by mouth twice a day   Labwork: None ordered.  Testing/Procedures: None ordered.  Follow-Up: Your physician wants you to follow-up in: 6 months with Dr. Lovena Le.   You will receive a reminder letter in the mail two months in advance. If you don't receive a letter, please call our office to schedule the follow-up appointment.  Any Other Special Instructions Will Be Listed Below (If Applicable).  If you need a refill on your cardiac medications before your next appointment, please call your pharmacy.   Apixaban Tablets What is this medicine? APIXABAN (a PIX a ban) is an anticoagulant (blood thinner). It is used to lower the chance of stroke in people with a medical condition called atrial fibrillation. It is also used to treat or prevent blood clots in the lungs or in the veins. This medicine may be used for other purposes; ask your health care provider or pharmacist if you have questions. COMMON BRAND NAME(S): Eliquis What should I tell my health care provider before I take this medicine? They need to know if you have any of these conditions:  antiphospholipid antibody syndrome  bleeding disorders  bleeding in the brain  blood in your stools (black or tarry stools) or if you have blood in your vomit  history of blood clots  history of stomach bleeding  kidney disease  liver disease  mechanical heart valve  an unusual or allergic reaction to apixaban, other medicines, foods, dyes, or preservatives  pregnant or trying to get pregnant  breast-feeding How should I use this medicine? Take this medicine by mouth with a glass of water. Follow the directions on the prescription label. You can take it with or without food. If it upsets your stomach, take it with food. Take your medicine at regular intervals. Do  not take it more often than directed. Do not stop taking except on your doctor's advice. Stopping this medicine may increase your risk of a blood clot. Be sure to refill your prescription before you run out of medicine. Talk to your pediatrician regarding the use of this medicine in children. Special care may be needed. Overdosage: If you think you have taken too much of this medicine contact a poison control center or emergency room at once. NOTE: This medicine is only for you. Do not share this medicine with others. What if I miss a dose? If you miss a dose, take it as soon as you can. If it is almost time for your next dose, take only that dose. Do not take double or extra doses. What may interact with this medicine? This medicine may interact with the following:  aspirin and aspirin-like medicines  certain medicines for fungal infections like ketoconazole and itraconazole  certain medicines for seizures like carbamazepine and phenytoin  certain medicines that treat or prevent blood clots like warfarin, enoxaparin, and dalteparin  clarithromycin  NSAIDs, medicines for pain and inflammation, like ibuprofen or naproxen  rifampin  ritonavir  St. John's wort This list may not describe all possible interactions. Give your health care provider a list of all the medicines, herbs, non-prescription drugs, or dietary supplements you use. Also tell them if you smoke, drink alcohol, or use illegal drugs. Some items may interact with your medicine. What should I watch for while using this medicine? Visit your healthcare professional for regular  checks on your progress. You may need blood work done while you are taking this medicine. Your condition will be monitored carefully while you are receiving this medicine. It is important not to miss any appointments. Avoid sports and activities that might cause injury while you are using this medicine. Severe falls or injuries can cause unseen bleeding. Be  careful when using sharp tools or knives. Consider using an Copy. Take special care brushing or flossing your teeth. Report any injuries, bruising, or red spots on the skin to your healthcare professional. If you are going to need surgery or other procedure, tell your healthcare professional that you are taking this medicine. Wear a medical ID bracelet or chain. Carry a card that describes your disease and details of your medicine and dosage times. What side effects may I notice from receiving this medicine? Side effects that you should report to your doctor or health care professional as soon as possible:  allergic reactions like skin rash, itching or hives, swelling of the face, lips, or tongue  signs and symptoms of bleeding such as bloody or black, tarry stools; red or dark-brown urine; spitting up blood or brown material that looks like coffee grounds; red spots on the skin; unusual bruising or bleeding from the eye, gums, or nose  signs and symptoms of a blood clot such as chest pain; shortness of breath; pain, swelling, or warmth in the leg  signs and symptoms of a stroke such as changes in vision; confusion; trouble speaking or understanding; severe headaches; sudden numbness or weakness of the face, arm or leg; trouble walking; dizziness; loss of coordination This list may not describe all possible side effects. Call your doctor for medical advice about side effects. You may report side effects to FDA at 1-800-FDA-1088. Where should I keep my medicine? Keep out of the reach of children. Store at room temperature between 20 and 25 degrees C (68 and 77 degrees F). Throw away any unused medicine after the expiration date. NOTE: This sheet is a summary. It may not cover all possible information. If you have questions about this medicine, talk to your doctor, pharmacist, or health care provider.  2021 Elsevier/Gold Standard (2020-01-04 16:54:11)

## 2020-04-02 NOTE — Progress Notes (Signed)
HPI [name] returns today after an almost 4 year absence from our EP clinic. He is a pleasant 70 yo man with HTN who developed PAF several years ago. Since then he has only had rare symptoms. He had been prescribed flecainide to be used as a pill in the pocket years ago but has only rarely used it. He continues to take atenolol and lisinopril for BP control. He denies chest pain or sob. No syncope and no edema. His CHADSVASC is 2. I encouraged him to take an [facility] when I saw him last but he refused. He has been on ASA. Allergies  Allergen Reactions  . Oxycodone Nausea And Vomiting  . Paroxetine Nausea And Vomiting and Other (See Comments)    Altered mental state  . Sulfa Antibiotics Hives     Current Outpatient Medications  Medication Sig Dispense Refill  . atenolol (TENORMIN) 25 MG tablet Take 1 tablet (25 mg total) by mouth 2 (two) times daily. 180 tablet 0  . atorvastatin (LIPITOR) 40 MG tablet Take 1 tablet (40 mg total) by mouth daily. 90 tablet 0  . Calcium Carbonate-Vitamin D 600-400 MG-UNIT tablet Take 1 tablet by mouth daily.    . flecainide (TAMBOCOR) 100 MG tablet May take 2 tablets daily as needed for breakthrough a-fib 30 tablet 5  . Fluocinolone Acetonide (DERMOTIC) 0.01 % OIL Place 1-2 drops in ear(s) 3 (three) times a week. 1-2 gtts to aa ears for psoriasis up to 3 days a week prn flares 20 mL 1  . fluticasone (FLONASE) 50 MCG/ACT nasal spray Place 2 sprays into both nostrils daily as needed for allergies.     Marland Kitchen lisinopril (ZESTRIL) 5 MG tablet Take 1 tablet (5 mg total) by mouth daily. 90 tablet 0  . Propylene Glycol (SYSTANE BALANCE OP) Place 1 drop into both eyes daily as needed (dry eyes).    . sildenafil (REVATIO) 20 MG tablet Take 5 tabs as needed 50 tablet 11  . venlafaxine XR (EFFEXOR XR) 37.5 MG 24 hr capsule Take 1 capsule (37.5 mg total) by mouth daily with breakfast. 90 capsule 3   No current facility-administered medications for this visit.      Past Medical History:  Diagnosis Date  . Allergy   . Anxiety   . Arthritis   . Atrial fibrillation [facility])    sees Dr. Lovena Le  . Cataract    forming  . Chronic kidney disease    kidney stone  . Depression   . Dizziness    or vertigo  . Dysrhythmia    atrial fibrillation, once or twice a year  . GERD (gastroesophageal reflux disease)   . Headache    eyglass flicker in eye is migraine symptoms  . History of kidney stones   . HLD (hyperlipidemia)   . HTN (hypertension)   . Osteopenia   . Retinal tear   . Sinus bradycardia     ROS:   All systems reviewed and negative except as noted in the HPI.   Past Surgical History:  Procedure Laterality Date  . CARDIAC CATHETERIZATION  2003   normal  . COLONOSCOPY  01/04/2018   per Dr. Fuller Plan, adenomatous  polyp, repeat in 5 years  . EXTRACORPOREAL SHOCK WAVE LITHOTRIPSY Left 01/21/2018   Procedure: EXTRACORPOREAL SHOCK WAVE LITHOTRIPSY (ESWL);  Surgeon: Hollice Espy, MD;  Location: [facility] ORS;  Service: Urology;  Laterality: Left;  . EYE SURGERY     retinal  . POLYPECTOMY    .  RETINAL LASER PROCEDURE Right    repair retinal tear   . WART REMOVAL     LEFT UPPER ARM  . WISDOM TOOTH EXTRACTION       Family History  Problem Relation Age of Onset  . Hodgkin's lymphoma Father   . Hyperlipidemia Other   . Hypertension Mother   . Stroke Mother   . Heart attack Brother   . Prostate cancer Brother        1st degree relative <50  . Diabetes Brother        1st degree relative  . Multiple myeloma Brother   . Hypertension Brother   . Heart disease Brother   . Heart attack Brother   . Colon polyps Brother   . Colon cancer Neg Hx   . Esophageal cancer Neg Hx   . Rectal cancer Neg Hx   . Stomach cancer Neg Hx      Social History   Socioeconomic History  . Marital status: Married    Spouse name: nancy  . Number of children: Not on file  . Years of education: Not on file  . Highest education level: Not on file   Occupational History  . Occupation: Multimedia programmer for [employer]  . Smoking status: Former Smoker    Types: Cigarettes    Quit date: 03/11/1975    Years since quitting: 45.0  . Smokeless tobacco: Never Used  Vaping Use  . Vaping Use: Never used  Substance and Sexual Activity  . Alcohol use: Yes    Alcohol/week: 0.0 standard drinks    Comment: couple times a month-SOCIALLY 1 BEER A MONTH  . Drug use: No  . Sexual activity: Yes    Birth control/protection: None  Other Topics Concern  . Not on file  Social History Narrative   Occupation: Optometrist.    Social Determinants of Health   Financial Resource Strain: Not on file  Food Insecurity: Not on file  Transportation Needs: Not on file  Physical Activity: Not on file  Stress: Not on file  Social Connections: Not on file  Intimate Partner Violence: Not on file     BP 126/78   Pulse 60   Ht $R'6\' 1"'WN$  (1.854 m)   Wt 227 lb 9.6 oz (103.2 kg)   SpO2 99%   BMI 30.03 kg/m   Physical Exam:  Well appearing NAD HEENT: Unremarkable Neck:  No JVD, no thyromegally Lymphatics:  No adenopathy Back:  No CVA tenderness Lungs:  Clear with no wheezes HEART:  Regular rate rhythm, no murmurs, no rubs, no clicks Abd:  soft, positive bowel sounds, no organomegally, no rebound, no guarding Ext:  2 plus pulses, no edema, no cyanosis, no clubbing Skin:  No rashes no nodules Neuro:  CN II through XII intact, motor grossly intact  EKG - reviewed. Sinus bradycardia  Cardiac monitor - reviewed. NS atrial tachycardia, lasting seconds.  Assess/Plan: 1. PAF - his CHADSVASC score is 2. He will continue his current meds. I have given a script for flecainide to be taken as a pill in the pocket. He will start Eliquis. He will continue the atenolol. If his symptoms worsen, he will consider taking flecainide twice daily. 2. HTN- his blood pressure is controlled with medical therapy. He will maintain a low sodium diet. 3. Sinus node dysfunction  - he is asymptomatic.  4. ED - we discussed the role of atenolol. He does not want to stop this medication. He will continue his sildenafil.  Carleene Overlie Elina Streng,MD

## 2020-04-03 DIAGNOSIS — M9907 Segmental and somatic dysfunction of upper extremity: Secondary | ICD-10-CM | POA: Diagnosis not present

## 2020-04-03 DIAGNOSIS — M9901 Segmental and somatic dysfunction of cervical region: Secondary | ICD-10-CM | POA: Diagnosis not present

## 2020-04-03 DIAGNOSIS — M9903 Segmental and somatic dysfunction of lumbar region: Secondary | ICD-10-CM | POA: Diagnosis not present

## 2020-04-03 DIAGNOSIS — M9902 Segmental and somatic dysfunction of thoracic region: Secondary | ICD-10-CM | POA: Diagnosis not present

## 2020-04-06 DIAGNOSIS — M9907 Segmental and somatic dysfunction of upper extremity: Secondary | ICD-10-CM | POA: Diagnosis not present

## 2020-04-06 DIAGNOSIS — M9901 Segmental and somatic dysfunction of cervical region: Secondary | ICD-10-CM | POA: Diagnosis not present

## 2020-04-06 DIAGNOSIS — M9903 Segmental and somatic dysfunction of lumbar region: Secondary | ICD-10-CM | POA: Diagnosis not present

## 2020-04-06 DIAGNOSIS — M9902 Segmental and somatic dysfunction of thoracic region: Secondary | ICD-10-CM | POA: Diagnosis not present

## 2020-04-09 ENCOUNTER — Institutional Professional Consult (permissible substitution): Payer: Medicare HMO | Admitting: Pulmonary Disease

## 2020-04-11 DIAGNOSIS — M9907 Segmental and somatic dysfunction of upper extremity: Secondary | ICD-10-CM | POA: Diagnosis not present

## 2020-04-11 DIAGNOSIS — M9901 Segmental and somatic dysfunction of cervical region: Secondary | ICD-10-CM | POA: Diagnosis not present

## 2020-04-11 DIAGNOSIS — M9902 Segmental and somatic dysfunction of thoracic region: Secondary | ICD-10-CM | POA: Diagnosis not present

## 2020-04-11 DIAGNOSIS — M9903 Segmental and somatic dysfunction of lumbar region: Secondary | ICD-10-CM | POA: Diagnosis not present

## 2020-04-12 DIAGNOSIS — M9901 Segmental and somatic dysfunction of cervical region: Secondary | ICD-10-CM | POA: Diagnosis not present

## 2020-04-12 DIAGNOSIS — M9902 Segmental and somatic dysfunction of thoracic region: Secondary | ICD-10-CM | POA: Diagnosis not present

## 2020-04-12 DIAGNOSIS — H43811 Vitreous degeneration, right eye: Secondary | ICD-10-CM | POA: Diagnosis not present

## 2020-04-12 DIAGNOSIS — M9903 Segmental and somatic dysfunction of lumbar region: Secondary | ICD-10-CM | POA: Diagnosis not present

## 2020-04-12 DIAGNOSIS — H35411 Lattice degeneration of retina, right eye: Secondary | ICD-10-CM | POA: Diagnosis not present

## 2020-04-12 DIAGNOSIS — Z0100 Encounter for examination of eyes and vision without abnormal findings: Secondary | ICD-10-CM | POA: Diagnosis not present

## 2020-04-12 DIAGNOSIS — H04123 Dry eye syndrome of bilateral lacrimal glands: Secondary | ICD-10-CM | POA: Diagnosis not present

## 2020-04-12 DIAGNOSIS — M9907 Segmental and somatic dysfunction of upper extremity: Secondary | ICD-10-CM | POA: Diagnosis not present

## 2020-04-12 DIAGNOSIS — H35371 Puckering of macula, right eye: Secondary | ICD-10-CM | POA: Diagnosis not present

## 2020-04-16 DIAGNOSIS — M9902 Segmental and somatic dysfunction of thoracic region: Secondary | ICD-10-CM | POA: Diagnosis not present

## 2020-04-16 DIAGNOSIS — M9903 Segmental and somatic dysfunction of lumbar region: Secondary | ICD-10-CM | POA: Diagnosis not present

## 2020-04-16 DIAGNOSIS — M9907 Segmental and somatic dysfunction of upper extremity: Secondary | ICD-10-CM | POA: Diagnosis not present

## 2020-04-16 DIAGNOSIS — M9901 Segmental and somatic dysfunction of cervical region: Secondary | ICD-10-CM | POA: Diagnosis not present

## 2020-04-18 ENCOUNTER — Encounter: Payer: Self-pay | Admitting: Podiatry

## 2020-04-18 ENCOUNTER — Ambulatory Visit (INDEPENDENT_AMBULATORY_CARE_PROVIDER_SITE_OTHER): Payer: Medicare HMO

## 2020-04-18 ENCOUNTER — Other Ambulatory Visit: Payer: Self-pay

## 2020-04-18 ENCOUNTER — Other Ambulatory Visit: Payer: Self-pay | Admitting: Podiatry

## 2020-04-18 ENCOUNTER — Ambulatory Visit: Payer: Medicare HMO | Admitting: Podiatry

## 2020-04-18 DIAGNOSIS — M7741 Metatarsalgia, right foot: Secondary | ICD-10-CM | POA: Diagnosis not present

## 2020-04-18 DIAGNOSIS — M2042 Other hammer toe(s) (acquired), left foot: Secondary | ICD-10-CM | POA: Diagnosis not present

## 2020-04-18 DIAGNOSIS — M21862 Other specified acquired deformities of left lower leg: Secondary | ICD-10-CM

## 2020-04-18 DIAGNOSIS — M779 Enthesopathy, unspecified: Secondary | ICD-10-CM

## 2020-04-18 DIAGNOSIS — M2041 Other hammer toe(s) (acquired), right foot: Secondary | ICD-10-CM

## 2020-04-18 DIAGNOSIS — M7742 Metatarsalgia, left foot: Secondary | ICD-10-CM | POA: Diagnosis not present

## 2020-04-18 DIAGNOSIS — M62461 Contracture of muscle, right lower leg: Secondary | ICD-10-CM

## 2020-04-18 DIAGNOSIS — M216X1 Other acquired deformities of right foot: Secondary | ICD-10-CM | POA: Diagnosis not present

## 2020-04-18 DIAGNOSIS — M216X2 Other acquired deformities of left foot: Secondary | ICD-10-CM | POA: Diagnosis not present

## 2020-04-18 DIAGNOSIS — M9907 Segmental and somatic dysfunction of upper extremity: Secondary | ICD-10-CM | POA: Diagnosis not present

## 2020-04-18 DIAGNOSIS — M62462 Contracture of muscle, left lower leg: Secondary | ICD-10-CM

## 2020-04-18 DIAGNOSIS — M9901 Segmental and somatic dysfunction of cervical region: Secondary | ICD-10-CM | POA: Diagnosis not present

## 2020-04-18 DIAGNOSIS — M9903 Segmental and somatic dysfunction of lumbar region: Secondary | ICD-10-CM | POA: Diagnosis not present

## 2020-04-18 DIAGNOSIS — M21861 Other specified acquired deformities of right lower leg: Secondary | ICD-10-CM

## 2020-04-18 DIAGNOSIS — M9902 Segmental and somatic dysfunction of thoracic region: Secondary | ICD-10-CM | POA: Diagnosis not present

## 2020-04-18 NOTE — Patient Instructions (Signed)
Do exercises exactly as told by your health care provider and adjust them as directed. It is normal to feel mild stretching, pulling, tightness, or discomfort as you do these exercises. Stop the exercise right away if you feel sudden pain or your pain gets worse.   Stretching exercises These exercises improve the movement and flexibility of your calf muscles. These exercises may also help to relieve pain and stiffness. Standing gastroc stretch  This exercise is also called a standing calf (gastroc) stretch. 1. Stand with your hands against a wall. 2. Extend your left / right leg behind you, and bend your front knee slightly. Your heels should be on the floor. 3. Keeping your heels on the floor and your back knee straight, shift your weight toward the wall. You should feel a gentle stretch in the back of your lower leg (calf). 4. Hold this position for 10 seconds. Repeat 10 times. Complete this exercise 2 times a day. Gastroc and soleus stretch, standing This is an exercise in which you stand on a step and use your body weight to stretch your calf muscles. To do this exercise: 1. Stand with the ball of your left / right foot on a step. The ball of your foot is on the walking surface, right under your toes. 2. Keep your other foot firmly on the same step. 3. Hold on to the wall, a railing, or a chair for balance. 4. Slowly lift your other foot, allowing your body weight to press your left / right heel down over the edge of the step. You should feel a stretch in your left / right calf. 5. Hold this position for 10 seconds. 6. Return both feet to the step. 7. Repeat this exercise with a slight bend in your left / right knee. Repeat 10 times. Complete this exercise 2 times a day. Strengthening exercise This exercise builds strength and endurance in your foot muscles and may help to take pressure off your heel. Endurance is the ability to use your muscles for a long time, even after they get  tired. Arch lifts This exercise is sometimes called foot intrinsics. This is an exercise in which you lift the arch part of your foot only. To do this exercise: 1. Sit in a chair with your feet flat on the floor. 2. Keeping your big toe and your heel on the floor, lift only your arch, which is on the inner edge of your left / right foot. Do not move your knee or scrunch your toes. This is a small movement. 3. Hold this position for 10 seconds. 4. Return to the starting position. Repeat 10 times. Complete this exercise 2 times a day.

## 2020-04-18 NOTE — Progress Notes (Signed)
  Subjective:  Patient ID: [name] [name], male    DOB: 06/13/1950,  MRN: [id number]  Chief Complaint  Patient presents with  . Foot Pain    Patient presents today for pain and burning in bottoms of bilat forefeet x 4-5 months.  He says its mostly when walking but will keep him up at night burning and they are sensitive to touch.  He also says they feel swollen at times.    70 y.o. male presents with the above complaint. History confirmed with patient. He has been diagnosed with diabetes for 3-4 years but is diet controlled and has been improving with weight loss. Denies sensation loss or tingling. He's very active and likes to walk   Objective:  Physical Exam: warm, good capillary refill, no trophic changes or ulcerative lesions, normal DP and PT pulses and normal sensory exam.  Normal monofilament exam.  Bilaterally he has gastrocnemius equinus with a positive Silfverskiold test, his prominent metatarsals and thinning of the metatarsal fat pad with a plantarflexed first ray.  He has hammertoe deformities bilaterally  Radiographs: X-ray of both feet: no fracture, dislocation, swelling or degenerative changes noted Assessment:   1. Metatarsalgia of both feet   2. Gastrocnemius equinus of left lower extremity   3. Gastrocnemius equinus of right lower extremity   4. Hammertoe of left foot   5. Hammertoe of right foot      Plan:  Patient was evaluated and treated and all questions answered.  Metatarsalgia -Discussed etiology and treatment options for metatarsalgia.  I do not think this is diabetic peripheral neuropathy, he has good sensation and a normal monofilament exam and is well controlled.  Today applied metatarsal offloading pads to his shoes and gave him extra pairs to put his walking shoes.  I think you benefit with managing these with calf stretching and orthotics.  I think the best thing for him would likely be an accommodative insole such as a diabetic multidensity insole in an  extra-depth shoe.  Alternatively custom molded orthoses given some pain relief and offload the metatarsals with metatarsal pad integrated into the orthotic.  I will have him scheduled with our pedorthist to be fitted for these he should follow with me in 4 months.  Twice daily stretching for equinus.  Return in about 4 months (around 08/16/2020).

## 2020-04-20 DIAGNOSIS — M9901 Segmental and somatic dysfunction of cervical region: Secondary | ICD-10-CM | POA: Diagnosis not present

## 2020-04-20 DIAGNOSIS — M9903 Segmental and somatic dysfunction of lumbar region: Secondary | ICD-10-CM | POA: Diagnosis not present

## 2020-04-20 DIAGNOSIS — M9907 Segmental and somatic dysfunction of upper extremity: Secondary | ICD-10-CM | POA: Diagnosis not present

## 2020-04-20 DIAGNOSIS — M9902 Segmental and somatic dysfunction of thoracic region: Secondary | ICD-10-CM | POA: Diagnosis not present

## 2020-04-23 DIAGNOSIS — M9903 Segmental and somatic dysfunction of lumbar region: Secondary | ICD-10-CM | POA: Diagnosis not present

## 2020-04-23 DIAGNOSIS — M9907 Segmental and somatic dysfunction of upper extremity: Secondary | ICD-10-CM | POA: Diagnosis not present

## 2020-04-23 DIAGNOSIS — M9902 Segmental and somatic dysfunction of thoracic region: Secondary | ICD-10-CM | POA: Diagnosis not present

## 2020-04-23 DIAGNOSIS — M9901 Segmental and somatic dysfunction of cervical region: Secondary | ICD-10-CM | POA: Diagnosis not present

## 2020-04-27 DIAGNOSIS — M9903 Segmental and somatic dysfunction of lumbar region: Secondary | ICD-10-CM | POA: Diagnosis not present

## 2020-04-27 DIAGNOSIS — M9901 Segmental and somatic dysfunction of cervical region: Secondary | ICD-10-CM | POA: Diagnosis not present

## 2020-04-27 DIAGNOSIS — M9902 Segmental and somatic dysfunction of thoracic region: Secondary | ICD-10-CM | POA: Diagnosis not present

## 2020-04-27 DIAGNOSIS — M9907 Segmental and somatic dysfunction of upper extremity: Secondary | ICD-10-CM | POA: Diagnosis not present

## 2020-04-30 DIAGNOSIS — M9903 Segmental and somatic dysfunction of lumbar region: Secondary | ICD-10-CM | POA: Diagnosis not present

## 2020-04-30 DIAGNOSIS — M9907 Segmental and somatic dysfunction of upper extremity: Secondary | ICD-10-CM | POA: Diagnosis not present

## 2020-04-30 DIAGNOSIS — M9902 Segmental and somatic dysfunction of thoracic region: Secondary | ICD-10-CM | POA: Diagnosis not present

## 2020-04-30 DIAGNOSIS — M9901 Segmental and somatic dysfunction of cervical region: Secondary | ICD-10-CM | POA: Diagnosis not present

## 2020-05-04 DIAGNOSIS — M9903 Segmental and somatic dysfunction of lumbar region: Secondary | ICD-10-CM | POA: Diagnosis not present

## 2020-05-04 DIAGNOSIS — M9901 Segmental and somatic dysfunction of cervical region: Secondary | ICD-10-CM | POA: Diagnosis not present

## 2020-05-04 DIAGNOSIS — M9907 Segmental and somatic dysfunction of upper extremity: Secondary | ICD-10-CM | POA: Diagnosis not present

## 2020-05-04 DIAGNOSIS — M9902 Segmental and somatic dysfunction of thoracic region: Secondary | ICD-10-CM | POA: Diagnosis not present

## 2020-05-16 ENCOUNTER — Other Ambulatory Visit: Payer: Self-pay

## 2020-05-16 ENCOUNTER — Ambulatory Visit (INDEPENDENT_AMBULATORY_CARE_PROVIDER_SITE_OTHER): Payer: Medicare HMO

## 2020-05-16 ENCOUNTER — Other Ambulatory Visit: Payer: Medicare HMO

## 2020-05-16 DIAGNOSIS — Z0000 Encounter for general adult medical examination without abnormal findings: Secondary | ICD-10-CM | POA: Diagnosis not present

## 2020-05-16 NOTE — Patient Instructions (Signed)
[name] , Thank you for taking time to come for your Medicare Wellness Visit. I appreciate your ongoing commitment to your health goals. Please review the following plan we discussed and let me know if I can assist you in the future.   Screening recommendations/referrals: Colonoscopy: Done 01/04/18 Recommended yearly ophthalmology/optometry visit for glaucoma screening and checkup Recommended yearly dental visit for hygiene and checkup  Vaccinations: Influenza vaccine: Declined at this time Pneumococcal vaccine: Declined at this time Tdap vaccine: Due and discussed Shingles vaccine: Shingrix discussed. Please contact your pharmacy for coverage information.    Covid-19: Pt stated completed will bring in card for actual dates   Advanced directives: Advance directive discussed with you today. Even though you declined this today please call our office should you change your mind and we can give you the proper paperwork for you to fill out.  Conditions/risks identified:Stay healthy and active and manage diet and weight losing 10 more lbs  Next appointment: Follow up in one year for your annual wellness visit.   Preventive Care 20 Years and Older, Male Preventive care refers to lifestyle choices and visits with your health care provider that can promote health and wellness. What does preventive care include?  A yearly physical exam. This is also called an annual well check.  Dental exams once or twice a year.  Routine eye exams. Ask your health care provider how often you should have your eyes checked.  Personal lifestyle choices, including:  Daily care of your teeth and gums.  Regular physical activity.  Eating a healthy diet.  Avoiding tobacco and drug use.  Limiting alcohol use.  Practicing safe sex.  Taking low doses of aspirin every day.  Taking vitamin and mineral supplements as recommended by your health care provider. What happens during an annual well  check? The services and screenings done by your health care provider during your annual well check will depend on your age, overall health, lifestyle risk factors, and family history of disease. Counseling  Your health care provider may ask you questions about your:  Alcohol use.  Tobacco use.  Drug use.  Emotional well-being.  Home and relationship well-being.  Sexual activity.  Eating habits.  History of falls.  Memory and ability to understand (cognition).  Work and work Statistician. Screening  You may have the following tests or measurements:  Height, weight, and BMI.  Blood pressure.  Lipid and cholesterol levels. These may be checked every 5 years, or more frequently if you are over 28 years old.  Skin check.  Lung cancer screening. You may have this screening every year starting at age 26 if you have a 30-pack-year history of smoking and currently smoke or have quit within the past 15 years.  Fecal occult blood test (FOBT) of the stool. You may have this test every year starting at age 15.  Flexible sigmoidoscopy or colonoscopy. You may have a sigmoidoscopy every 5 years or a colonoscopy every 10 years starting at age 52.  Prostate cancer screening. Recommendations will vary depending on your family history and other risks.  Hepatitis C blood test.  Hepatitis B blood test.  Sexually transmitted disease (STD) testing.  Diabetes screening. This is done by checking your blood sugar (glucose) after you have not eaten for a while (fasting). You may have this done every 1-3 years.  Abdominal aortic aneurysm (AAA) screening. You may need this if you are a current or former smoker.  Osteoporosis. You may be screened starting at  age 29 if you are at high risk. Talk with your health care provider about your test results, treatment options, and if necessary, the need for more tests. Vaccines  Your health care provider may recommend certain vaccines, such  as:  Influenza vaccine. This is recommended every year.  Tetanus, diphtheria, and acellular pertussis (Tdap, Td) vaccine. You may need a Td booster every 10 years.  Zoster vaccine. You may need this after age 68.  Pneumococcal 13-valent conjugate (PCV13) vaccine. One dose is recommended after age 28.  Pneumococcal polysaccharide (PPSV23) vaccine. One dose is recommended after age 70. Talk to your health care provider about which screenings and vaccines you need and how often you need them. This information is not intended to replace advice given to you by your health care provider. Make sure you discuss any questions you have with your health care provider. Document Released: 03/23/2015 Document Revised: 11/14/2015 Document Reviewed: 12/26/2014 Elsevier Interactive Patient Education  2017 [place] Prevention in the Home Falls can cause injuries. They can happen to people of all ages. There are many things you can do to make your home safe and to help prevent falls. What can I do on the outside of my home?  Regularly fix the edges of walkways and driveways and fix any cracks.  Remove anything that might make you trip as you walk through a door, such as a raised step or threshold.  Trim any bushes or trees on the path to your home.  Use bright outdoor lighting.  Clear any walking paths of anything that might make someone trip, such as rocks or tools.  Regularly check to see if handrails are loose or broken. Make sure that both sides of any steps have handrails.  Any raised decks and porches should have guardrails on the edges.  Have any leaves, snow, or ice cleared regularly.  Use sand or salt on walking paths during winter.  Clean up any spills in your garage right away. This includes oil or grease spills. What can I do in the bathroom?  Use night lights.  Install grab bars by the toilet and in the tub and shower. Do not use towel bars as grab bars.  Use  non-skid mats or decals in the tub or shower.  If you need to sit down in the shower, use a plastic, non-slip stool.  Keep the floor dry. Clean up any water that spills on the floor as soon as it happens.  Remove soap buildup in the tub or shower regularly.  Attach bath mats securely with double-sided non-slip rug tape.  Do not have throw rugs and other things on the floor that can make you trip. What can I do in the bedroom?  Use night lights.  Make sure that you have a light by your bed that is easy to reach.  Do not use any sheets or blankets that are too big for your bed. They should not hang down onto the floor.  Have a firm chair that has side arms. You can use this for support while you get dressed.  Do not have throw rugs and other things on the floor that can make you trip. What can I do in the kitchen?  Clean up any spills right away.  Avoid walking on wet floors.  Keep items that you use a lot in easy-to-reach places.  If you need to reach something above you, use a strong step stool that has a grab bar.  Keep electrical cords out of the way.  Do not use floor polish or wax that makes floors slippery. If you must use wax, use non-skid floor wax.  Do not have throw rugs and other things on the floor that can make you trip. What can I do with my stairs?  Do not leave any items on the stairs.  Make sure that there are handrails on both sides of the stairs and use them. Fix handrails that are broken or loose. Make sure that handrails are as long as the stairways.  Check any carpeting to make sure that it is firmly attached to the stairs. Fix any carpet that is loose or worn.  Avoid having throw rugs at the top or bottom of the stairs. If you do have throw rugs, attach them to the floor with carpet tape.  Make sure that you have a light switch at the top of the stairs and the bottom of the stairs. If you do not have them, ask someone to add them for you. What  else can I do to help prevent falls?  Wear shoes that:  Do not have high heels.  Have rubber bottoms.  Are comfortable and fit you well.  Are closed at the toe. Do not wear sandals.  If you use a stepladder:  Make sure that it is fully opened. Do not climb a closed stepladder.  Make sure that both sides of the stepladder are locked into place.  Ask someone to hold it for you, if possible.  Clearly mark and make sure that you can see:  Any grab bars or handrails.  First and last steps.  Where the edge of each step is.  Use tools that help you move around (mobility aids) if they are needed. These include:  Canes.  Walkers.  Scooters.  Crutches.  Turn on the lights when you go into a dark area. Replace any light bulbs as soon as they burn out.  Set up your furniture so you have a clear path. Avoid moving your furniture around.  If any of your floors are uneven, fix them.  If there are any pets around you, be aware of where they are.  Review your medicines with your doctor. Some medicines can make you feel dizzy. This can increase your chance of falling. Ask your doctor what other things that you can do to help prevent falls. This information is not intended to replace advice given to you by your health care provider. Make sure you discuss any questions you have with your health care provider. Document Released: 12/21/2008 Document Revised: 08/02/2015 Document Reviewed: 03/31/2014 Elsevier Interactive Patient Education  2017 [employer].

## 2020-05-16 NOTE — Progress Notes (Signed)
Virtual Visit via Telephone Note  I connected with  [name] [name] on 05/16/20 at  8:45 AM EST by telephone and verified that I am speaking with the correct person using two identifiers.  Medicare Annual Wellness visit completed telephonically due to Covid-19 pandemic.   Persons participating in this call: This Health Coach and this patient.   Location: Patient: Home Provider: Office   I discussed the limitations, risks, security and privacy concerns of performing an evaluation and management service by telephone and the availability of in person appointments. The patient expressed understanding and agreed to proceed.  Unable to perform video visit due to video visit attempted and failed and/or patient does not have video capability.   Some vital signs may be absent or patient reported.   Willette Brace, LPN    Subjective:   [name] [name] is a 70 y.o. male who presents for an Initial Medicare Annual Wellness Visit.  Review of Systems     Cardiac Risk Factors include: advanced age (>69mn, >>52women);diabetes mellitus;male gender;dyslipidemia;hypertension;obesity (BMI >30kg/m2)     Objective:    There were no vitals filed for this visit. There is no height or weight on file to calculate BMI.  Advanced Directives 05/16/2020 01/29/2018 01/27/2018 11/24/2017  Does Patient Have a Medical Advance Directive? No No No No  Would patient like information on creating a medical advance directive? No - Patient declined No - Patient declined - No - Patient declined    Current Medications (verified) Outpatient Encounter Medications as of 05/16/2020  Medication Sig  . aspirin EC 81 MG tablet Take 81 mg by mouth daily. Swallow whole.  .Marland Kitchenatenolol (TENORMIN) 25 MG tablet Take 1 tablet (25 mg total) by mouth 2 (two) times daily.  .Marland Kitchenatorvastatin (LIPITOR) 40 MG tablet Take 1 tablet (40 mg total) by mouth daily.  . Calcium Carbonate-Vitamin D 600-400 MG-UNIT tablet Take 1 tablet by mouth  daily.  . Fluocinolone Acetonide (DERMOTIC) 0.01 % OIL Place 1-2 drops in ear(s) 3 (three) times a week. 1-2 gtts to aa ears for psoriasis up to 3 days a week prn flares  . fluticasone (FLONASE) 50 MCG/ACT nasal spray Place 2 sprays into both nostrils daily as needed for allergies.   .Marland Kitchenlisinopril (ZESTRIL) 5 MG tablet Take 1 tablet (5 mg total) by mouth daily.  .Marland KitchenPropylene Glycol (SYSTANE BALANCE OP) Place 1 drop into both eyes daily as needed (dry eyes).  . sildenafil (REVATIO) 20 MG tablet Take 5 tabs as needed  . venlafaxine XR (EFFEXOR XR) 37.5 MG 24 hr capsule Take 1 capsule (37.5 mg total) by mouth daily with breakfast.  . apixaban (ELIQUIS) 5 MG TABS tablet Take 1 tablet (5 mg total) by mouth 2 (two) times daily. (Patient not taking: Reported on 05/16/2020)  . flecainide (TAMBOCOR) 100 MG tablet May take 2 tablets daily as needed for breakthrough a-fib (Patient not taking: Reported on 05/16/2020)   No facility-administered encounter medications on file as of 05/16/2020.    Allergies (verified) Oxycodone, Paroxetine, and Sulfa antibiotics   History: Past Medical History:  Diagnosis Date  . Allergy   . Anxiety   . Arthritis   . Atrial fibrillation ([facility]    sees Dr. TLovena Le . Cataract    forming  . Chronic kidney disease    kidney stone  . Depression   . Dizziness    or vertigo  . Dysrhythmia    atrial fibrillation, once or twice a year  . GERD (  gastroesophageal reflux disease)   . Headache    eyglass flicker in eye is migraine symptoms  . History of kidney stones   . HLD (hyperlipidemia)   . HTN (hypertension)   . Osteopenia   . Retinal tear   . Sinus bradycardia    Past Surgical History:  Procedure Laterality Date  . CARDIAC CATHETERIZATION  2003   normal  . COLONOSCOPY  01/04/2018   per Dr. Fuller Plan, adenomatous  polyp, repeat in 5 years  . EXTRACORPOREAL SHOCK WAVE LITHOTRIPSY Left 01/21/2018   Procedure: EXTRACORPOREAL SHOCK WAVE LITHOTRIPSY (ESWL);  Surgeon:  Hollice Espy, MD;  Location: [facility] ORS;  Service: Urology;  Laterality: Left;  . EYE SURGERY     retinal  . POLYPECTOMY    . RETINAL LASER PROCEDURE Right    repair retinal tear   . WART REMOVAL     LEFT UPPER ARM  . WISDOM TOOTH EXTRACTION     Family History  Problem Relation Age of Onset  . Hodgkin's lymphoma Father   . Hyperlipidemia Other   . Hypertension Mother   . Stroke Mother   . Heart attack Brother   . Prostate cancer Brother        1st degree relative <50  . Diabetes Brother        1st degree relative  . Multiple myeloma Brother   . Hypertension Brother   . Heart disease Brother   . Heart attack Brother   . Colon polyps Brother   . Colon cancer Neg Hx   . Esophageal cancer Neg Hx   . Rectal cancer Neg Hx   . Stomach cancer Neg Hx    Social History   Socioeconomic History  . Marital status: Married    Spouse name: nancy  . Number of children: Not on file  . Years of education: Not on file  . Highest education level: Not on file  Occupational History  . 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 Smoking status: Former Smoker    Types: Cigarettes    Quit date: 03/11/1975    Years since quitting: 45.2  . Smokeless tobacco: Never Used  Vaping Use  . Vaping Use: Never used  Substance and Sexual Activity  . Alcohol use: Yes    Alcohol/week: 0.0 standard drinks    Comment: couple times a month-SOCIALLY 1 BEER A MONTH  . Drug use: No  . Sexual activity: Yes    Birth control/protection: None  Other Topics Concern  . Not on file  Social History Narrative   Occupation: Optometrist.    Social Determinants of Health   Financial Resource Strain: Low Risk   . Difficulty of Paying Living Expenses: Not hard at all  Food Insecurity: No Food Insecurity  . Worried About Charity fundraiser in the Last Year: Never true  . Ran Out of Food in the Last Year: Never true  Transportation Needs: No Transportation Needs  . Lack of  Transportation (Medical): No  . Lack of Transportation (Non-Medical): No  Physical Activity: Sufficiently Active  . Days of Exercise per Week: 6 days  . Minutes of Exercise per Session: 50 min  Stress: No Stress Concern Present  . Feeling of Stress : Not at all  Social Connections: Moderately Integrated  . Frequency of Communication with Friends and Family: More than three times a week  . Frequency of Social Gatherings with Friends and Family: More than three times a week  .  Attends Religious Services: More than 4 times per year  . Active Member of Clubs or Organizations: No  . Attends Archivist Meetings: Never  . Marital Status: Married    Tobacco Counseling Counseling given: Not Answered   Clinical Intake:  Pre-visit preparation completed: Yes  Pain : No/denies pain     BMI - recorded: 30.03 Nutritional Status: BMI > 30  Obese Nutritional Risks: None Diabetes: Yes CBG done?: No Did pt. bring in CBG monitor from home?: No  How often do you need to have someone help you when you read instructions, pamphlets, or other written materials from your doctor or pharmacy?: 1 - Never  Diabetic?Nutrition Risk Assessment:  Has the patient had any N/V/D within the last 2 months?  No Does the patient have any non-healing wounds?  No  Has the patient had any unintentional weight loss or weight gain?  No   Diabetes:  Is the patient diabetic?  Yes  If diabetic, was a CBG obtained today?  No  Did the patient bring in their glucometer from home?  No  How often do you monitor your CBG's? N/A.   Financial Strains and Diabetes Management:  Are you having any financial strains with the device, your supplies or your medication? No .  Does the patient want to be seen by Chronic Care Management for management of their diabetes?  No  Would the patient like to be referred to a Nutritionist or for Diabetic Management?  No   Diabetic Exams:  Diabetic Eye Exam: Completed  03/2020 Diabetic Foot Exam: Completed 05/16/20  Interpreter Needed?: No  Information entered by :: Charlott Rakes, LPN   Activities of Daily Living In your present state of health, do you have any difficulty performing the following activities: 05/16/2020  Hearing? N  Vision? N  Difficulty concentrating or making decisions? N  Walking or climbing stairs? N  Dressing or bathing? N  Doing errands, shopping? N  Preparing Food and eating ? N  Using the Toilet? N  In the past six months, have you accidently leaked urine? N  Do you have problems with loss of bowel control? N  Managing your Medications? N  Managing your Finances? N  Housekeeping or managing your Housekeeping? N  Some recent data might be hidden    Patient Care Team: Laurey Morale, MD as PCP - General  Indicate any recent Medical Services you may have received from other than [facility] providers in the past year (date may be approximate).     Assessment:   This is a routine wellness examination for [name] [name].  Hearing/Vision screen  Hearing Screening   _0  _1  _2  _3  _4  _5  _6  _7  _8   Right ear:           Left ear:           Comments: Pt denies any hearing issues  Vision Screening Comments: Pt follows up with Dr Antony Blackbird for annual eye exams   Dietary issues and exercise activities discussed: Current Exercise Habits: Home exercise routine, Type of exercise: walking, Time (Minutes): 45, Frequency (Times/Week): 6, Weekly Exercise (Minutes/Week): 270  Goals    . Patient Stated     Stay healthy and active and manage diet and weight losing 10 more lbs      Depression Screen PHQ 2/9 Scores 05/16/2020 08/24/2018 08/15/2016  PHQ - 2 Score 0 0 0    Fall Risk Fall Risk  05/16/2020 08/24/2018 01/25/2018 08/15/2016  Falls in the past  year? 0 0 0 Yes  Comment - - Emmi Telephone Survey: data to providers prior to load -  Number falls in past yr: 0 - - 1  Injury with Fall? 0 - - Yes  Risk for fall due  to : Impaired vision - - -  Follow up Falls prevention discussed - - Falls evaluation completed    FALL RISK PREVENTION PERTAINING TO THE HOME:  Any stairs in or around the home? Yes  If so, are there any without handrails? No  Home free of loose throw rugs in walkways, pet beds, electrical cords, etc? Yes  Adequate lighting in your home to reduce risk of falls? Yes   ASSISTIVE DEVICES UTILIZED TO PREVENT FALLS:  Life alert? No  Use of a cane, walker or w/c? No  Grab bars in the bathroom? No  Shower chair or bench in shower? Yes  Elevated toilet seat or a handicapped toilet? Yes   TIMED UP AND GO:  Was the test performed? No .     Cognitive Function:     6CIT Screen 05/16/2020  What Year? 0 points  What month? 0 points  Count back from 20 0 points  Months in reverse 0 points  Repeat phrase 0 points    Immunizations  There is no immunization history on file for this patient.  TDAP status: Due, Education has been provided regarding the importance of this vaccine. Advised may receive this vaccine at local pharmacy or Health Dept. Aware to provide a copy of the vaccination record if obtained from local pharmacy or Health Dept. Verbalized acceptance and understanding.  Flu Vaccine status: Declined, Education has been provided regarding the importance of this vaccine but patient still declined. Advised may receive this vaccine at local pharmacy or Health Dept. Aware to provide a copy of the vaccination record if obtained from local pharmacy or Health Dept. Verbalized acceptance and understanding.  Pneumococcal vaccine status: Declined,  Education has been provided regarding the importance of this vaccine but patient still declined. Advised may receive this vaccine at local pharmacy or Health Dept. Aware to provide a copy of the vaccination record if obtained from local pharmacy or Health Dept. Verbalized acceptance and understanding.   Covid-19 vaccine status: Completed vaccines  per pt will bring card in at Physical visit   Qualifies for Shingles Vaccine? Yes   Zostavax completed No   Shingrix Completed?: No.    Education has been provided regarding the importance of this vaccine. Patient has been advised to call insurance company to determine out of pocket expense if they have not yet received this vaccine. Advised may also receive vaccine at local pharmacy or Health Dept. Verbalized acceptance and understanding.  Screening Tests Health Maintenance  Topic Date Due  . Hepatitis C Screening  Never done  . COVID-19 Vaccine (1) Never done  . TETANUS/TDAP  Never done  . INFLUENZA VACCINE  06/07/2020 (Originally 10/09/2019)  . PNA vac Low Risk Adult (1 of 2 - PCV13) 05/16/2021 (Originally 06/11/2016)  . HEMOGLOBIN A1C  08/27/2020  . OPHTHALMOLOGY EXAM  03/12/2021  . FOOT EXAM  05/16/2021  . COLONOSCOPY (Pts 45-31yr Insurance coverage will need to be confirmed)  01/05/2023  . HPV VACCINES  Aged Out    Health Maintenance  Health Maintenance Due  Topic Date Due  . Hepatitis C Screening  Never done  . COVID-19 Vaccine (1) Never done  . TETANUS/TDAP  Never done    Colorectal cancer screening: Type of screening: Colonoscopy. Completed  01/04/18. Repeat every 5 years  Additional Screening:  Hepatitis C Screening: does qualify;  Vision Screening: Recommended annual ophthalmology exams for early detection of glaucoma and other disorders of the eye. Is the patient up to date with their annual eye exam?  Yes  Who is the provider or what is the name of the office in which the patient attends annual eye exams? Dr Antony Blackbird If pt is not established with a provider, would they like to be referred to a provider to establish care? No .   Dental Screening: Recommended annual dental exams for proper oral hygiene  Community Resource Referral / Chronic Care Management: CRR required this visit?  No   CCM required this visit?  No      Plan:     I have personally  reviewed and noted the following in the patient's chart:   . Medical and social history . Use of alcohol, tobacco or illicit drugs  . Current medications and supplements . Functional ability and status . Nutritional status . Physical activity . Advanced directives . List of other physicians . Hospitalizations, surgeries, and ER visits in previous 12 months . Vitals . Screenings to include cognitive, depression, and falls . Referrals and appointments  In addition, I have reviewed and discussed with patient certain preventive protocols, quality metrics, and best practice recommendations. A written personalized care plan for preventive services as well as general preventive health recommendations were provided to patient.     Willette Brace, LPN   05/14/4678   Nurse Notes: None

## 2020-05-22 ENCOUNTER — Other Ambulatory Visit: Payer: Self-pay | Admitting: Family Medicine

## 2020-05-23 ENCOUNTER — Other Ambulatory Visit: Payer: Medicare HMO

## 2020-06-13 ENCOUNTER — Telehealth: Payer: Self-pay

## 2020-06-13 NOTE — Telephone Encounter (Signed)
Pt returned your call to get scheduled to pick up diabetic shoes.

## 2020-06-13 NOTE — Telephone Encounter (Signed)
LVM for pt to call back to schedule an appt to pick up diabetic shoes.

## 2020-06-18 ENCOUNTER — Ambulatory Visit (INDEPENDENT_AMBULATORY_CARE_PROVIDER_SITE_OTHER): Payer: Medicare HMO

## 2020-06-18 ENCOUNTER — Other Ambulatory Visit: Payer: Self-pay | Admitting: Family Medicine

## 2020-06-18 ENCOUNTER — Other Ambulatory Visit: Payer: Self-pay

## 2020-06-18 DIAGNOSIS — M2042 Other hammer toe(s) (acquired), left foot: Secondary | ICD-10-CM | POA: Diagnosis not present

## 2020-06-18 DIAGNOSIS — E119 Type 2 diabetes mellitus without complications: Secondary | ICD-10-CM

## 2020-06-18 DIAGNOSIS — M2041 Other hammer toe(s) (acquired), right foot: Secondary | ICD-10-CM

## 2020-06-18 NOTE — Progress Notes (Signed)
Patient presents for diabetic shoe pick up, shoes are tried on for good fit.  Patient received 1 Pair and 3 pairs custom molded diabetic inserts.  Verbal and written break in and wear instructions given.  Patient will follow up for scheduled routine care.   

## 2020-06-26 ENCOUNTER — Other Ambulatory Visit: Payer: Self-pay | Admitting: Family Medicine

## 2020-07-02 ENCOUNTER — Other Ambulatory Visit: Payer: Self-pay | Admitting: Family Medicine

## 2020-07-02 DIAGNOSIS — S39012A Strain of muscle, fascia and tendon of lower back, initial encounter: Secondary | ICD-10-CM | POA: Diagnosis not present

## 2020-07-02 DIAGNOSIS — M5416 Radiculopathy, lumbar region: Secondary | ICD-10-CM | POA: Diagnosis not present

## 2020-07-02 DIAGNOSIS — M545 Low back pain, unspecified: Secondary | ICD-10-CM | POA: Diagnosis not present

## 2020-07-02 DIAGNOSIS — M47816 Spondylosis without myelopathy or radiculopathy, lumbar region: Secondary | ICD-10-CM | POA: Diagnosis not present

## 2020-08-15 ENCOUNTER — Ambulatory Visit: Payer: Medicare HMO | Admitting: Podiatry

## 2020-08-22 ENCOUNTER — Ambulatory Visit: Payer: Medicare HMO | Admitting: Podiatry

## 2020-08-28 DIAGNOSIS — R110 Nausea: Secondary | ICD-10-CM | POA: Diagnosis not present

## 2020-08-28 DIAGNOSIS — J019 Acute sinusitis, unspecified: Secondary | ICD-10-CM | POA: Diagnosis not present

## 2020-08-28 DIAGNOSIS — Z03818 Encounter for observation for suspected exposure to other biological agents ruled out: Secondary | ICD-10-CM | POA: Diagnosis not present

## 2020-08-28 DIAGNOSIS — U071 COVID-19: Secondary | ICD-10-CM | POA: Diagnosis not present

## 2020-08-28 DIAGNOSIS — B9689 Other specified bacterial agents as the cause of diseases classified elsewhere: Secondary | ICD-10-CM | POA: Diagnosis not present

## 2020-09-03 ENCOUNTER — Encounter: Payer: Medicare HMO | Admitting: Family Medicine

## 2020-09-06 ENCOUNTER — Encounter: Payer: Medicare HMO | Admitting: Dermatology

## 2020-09-17 ENCOUNTER — Encounter: Payer: Medicare HMO | Admitting: Family Medicine

## 2020-09-17 ENCOUNTER — Other Ambulatory Visit: Payer: Self-pay | Admitting: Family Medicine

## 2020-09-24 ENCOUNTER — Other Ambulatory Visit: Payer: Self-pay

## 2020-09-24 ENCOUNTER — Encounter: Payer: Self-pay | Admitting: Family Medicine

## 2020-09-24 ENCOUNTER — Ambulatory Visit (INDEPENDENT_AMBULATORY_CARE_PROVIDER_SITE_OTHER): Payer: Medicare HMO | Admitting: Family Medicine

## 2020-09-24 VITALS — BP 118/68 | HR 56 | Temp 98.1°F | Ht 73.0 in | Wt 231.0 lb

## 2020-09-24 DIAGNOSIS — Z0000 Encounter for general adult medical examination without abnormal findings: Secondary | ICD-10-CM | POA: Diagnosis not present

## 2020-09-24 LAB — CBC WITH DIFFERENTIAL/PLATELET
Basophils Absolute: 0.1 10*3/uL (ref 0.0–0.1)
Basophils Relative: 1.3 % (ref 0.0–3.0)
Eosinophils Absolute: 0.6 10*3/uL (ref 0.0–0.7)
Eosinophils Relative: 10.2 % — ABNORMAL HIGH (ref 0.0–5.0)
HCT: 39.6 % (ref 39.0–52.0)
Hemoglobin: 13.4 g/dL (ref 13.0–17.0)
Lymphocytes Relative: 23 % (ref 12.0–46.0)
Lymphs Abs: 1.4 10*3/uL (ref 0.7–4.0)
MCHC: 33.8 g/dL (ref 30.0–36.0)
MCV: 80.1 fl (ref 78.0–100.0)
Monocytes Absolute: 0.6 10*3/uL (ref 0.1–1.0)
Monocytes Relative: 10.1 % (ref 3.0–12.0)
Neutro Abs: 3.4 10*3/uL (ref 1.4–7.7)
Neutrophils Relative %: 55.4 % (ref 43.0–77.0)
Platelets: 178 10*3/uL (ref 150.0–400.0)
RBC: 4.94 Mil/uL (ref 4.22–5.81)
RDW: 15 % (ref 11.5–15.5)
WBC: 6.1 10*3/uL (ref 4.0–10.5)

## 2020-09-24 LAB — HEPATIC FUNCTION PANEL
ALT: 15 U/L (ref 0–53)
AST: 14 U/L (ref 0–37)
Albumin: 4 g/dL (ref 3.5–5.2)
Alkaline Phosphatase: 74 U/L (ref 39–117)
Bilirubin, Direct: 0.2 mg/dL (ref 0.0–0.3)
Total Bilirubin: 0.9 mg/dL (ref 0.2–1.2)
Total Protein: 6.2 g/dL (ref 6.0–8.3)

## 2020-09-24 LAB — LIPID PANEL
Cholesterol: 146 mg/dL (ref 0–200)
HDL: 43.6 mg/dL (ref >39.00)
LDL Cholesterol: 77 mg/dL (ref 0–99)
NonHDL: 102.14
Total CHOL/HDL Ratio: 3
Triglycerides: 124 mg/dL (ref 0.0–149.0)
VLDL: 24.8 mg/dL (ref 0.0–40.0)

## 2020-09-24 LAB — T3, FREE: T3, Free: 3.5 pg/mL (ref 2.3–4.2)

## 2020-09-24 LAB — BASIC METABOLIC PANEL
BUN: 23 mg/dL (ref 6–23)
CO2: 31 mEq/L (ref 19–32)
Calcium: 8.8 mg/dL (ref 8.4–10.5)
Chloride: 102 mEq/L (ref 96–112)
Creatinine, Ser: 1.04 mg/dL (ref 0.40–1.50)
GFR: 73.38 mL/min (ref >60.00)
Glucose, Bld: 134 mg/dL — ABNORMAL HIGH (ref 70–99)
Potassium: 4.4 mEq/L (ref 3.5–5.1)
Sodium: 139 mEq/L (ref 135–145)

## 2020-09-24 LAB — TSH: TSH: 2.69 u[IU]/mL (ref 0.35–5.50)

## 2020-09-24 LAB — HEMOGLOBIN A1C: Hgb A1c MFr Bld: 7.3 % — ABNORMAL HIGH (ref 4.6–6.5)

## 2020-09-24 LAB — T4, FREE: Free T4: 0.87 ng/dL (ref 0.60–1.60)

## 2020-09-24 LAB — PSA: PSA: 1.89 ng/mL (ref 0.10–4.00)

## 2020-09-24 MED ORDER — ASPIRIN EC 325 MG PO TBEC: 325.0000 mg | DELAYED_RELEASE_TABLET | Freq: Every day | ORAL | 0 refills | Status: AC

## 2020-09-24 MED ORDER — ATENOLOL 25 MG PO TABS
25.0000 mg | ORAL_TABLET | Freq: Two times a day (BID) | ORAL | 3 refills | Status: DC
Start: 2020-09-24 — End: 2021-09-26

## 2020-09-24 MED ORDER — ATORVASTATIN CALCIUM 40 MG PO TABS: 40.0000 mg | ORAL_TABLET | Freq: Every day | ORAL | 3 refills | Status: DC

## 2020-09-24 NOTE — Progress Notes (Signed)
Subjective:    Patient ID: [name] [name], male    DOB: [birth year], 70 y.o.   MRN: [id number]  HPI Here for a well exam.  He feels well. He is enjoying his retirement. He saw Dr. Hall Busing last winter, and he recommended that Gale take Eliquis. However he has not done so, and insteead he takes ASA 81 mg daily. He says he has bot experinced any fibrillation for about 3 years.    Review of Systems  Constitutional: Negative.   HENT: Negative.    Eyes: Negative.   Respiratory: Negative.    Cardiovascular: Negative.   Gastrointestinal: Negative.   Genitourinary: Negative.   Musculoskeletal: Negative.   Skin: Negative.   Neurological: Negative.   Psychiatric/Behavioral: Negative.        Objective:   Physical Exam Constitutional:      General: He is not in acute distress.    Appearance: Normal appearance. He is well-developed. He is not diaphoretic.  HENT:     Head: Normocephalic and atraumatic.     Right Ear: External ear normal.     Left Ear: External ear normal.     Nose: Nose normal.     Mouth/Throat:     Pharynx: No oropharyngeal exudate.  Eyes:     General: No scleral icterus.       Right eye: No discharge.        Left eye: No discharge.     Conjunctiva/sclera: Conjunctivae normal.     Pupils: Pupils are equal, round, and reactive to light.  Neck:     Thyroid: No thyromegaly.     Vascular: No JVD.     Trachea: No tracheal deviation.  Cardiovascular:     Rate and Rhythm: Normal rate and regular rhythm.     Heart sounds: Normal heart sounds. No murmur heard.   No friction rub. No gallop.  Pulmonary:     Effort: Pulmonary effort is normal. No respiratory distress.     Breath sounds: Normal breath sounds. No wheezing or rales.  Chest:     Chest wall: No tenderness.  Abdominal:     General: Bowel sounds are normal. There is no distension.     Palpations: Abdomen is soft. There is no mass.     Tenderness: There is no abdominal tenderness. There is no guarding or  rebound.  Genitourinary:    Penis: Normal. No tenderness.      Testes: Normal.     Prostate: Normal.     Rectum: Normal. Guaiac result negative.  Musculoskeletal:        General: No tenderness. Normal range of motion.     Cervical back: Neck supple.  Lymphadenopathy:     Cervical: No cervical adenopathy.  Skin:    General: Skin is warm and dry.     Coloration: Skin is not pale.     Findings: No erythema or rash.  Neurological:     Mental Status: He is alert and oriented to person, place, and time.     Cranial Nerves: No cranial nerve deficit.     Motor: No abnormal muscle tone.     Coordination: Coordination normal.     Deep Tendon Reflexes: Reflexes are normal and symmetric. Reflexes normal.  Psychiatric:        Behavior: Behavior normal.        Thought Content: Thought content normal.        Judgment: Judgment normal.          Assessment &  Plan:  Well exam. We discussed diet and exercise. Get fasting labs. We agreed that he would stay off Eliquis, but I advised him to increase the dose of daily ASA to 325 mg.  Alysia Penna, MD

## 2020-09-25 ENCOUNTER — Other Ambulatory Visit: Payer: Self-pay

## 2020-09-25 ENCOUNTER — Ambulatory Visit (INDEPENDENT_AMBULATORY_CARE_PROVIDER_SITE_OTHER): Payer: Medicare HMO | Admitting: Dermatology

## 2020-09-25 DIAGNOSIS — D229 Melanocytic nevi, unspecified: Secondary | ICD-10-CM

## 2020-09-25 DIAGNOSIS — Z1283 Encounter for screening for malignant neoplasm of skin: Secondary | ICD-10-CM

## 2020-09-25 DIAGNOSIS — D1800 Hemangioma unspecified site: Secondary | ICD-10-CM | POA: Diagnosis not present

## 2020-09-25 DIAGNOSIS — L409 Psoriasis, unspecified: Secondary | ICD-10-CM | POA: Diagnosis not present

## 2020-09-25 DIAGNOSIS — L814 Other melanin hyperpigmentation: Secondary | ICD-10-CM | POA: Diagnosis not present

## 2020-09-25 DIAGNOSIS — L578 Other skin changes due to chronic exposure to nonionizing radiation: Secondary | ICD-10-CM | POA: Diagnosis not present

## 2020-09-25 DIAGNOSIS — L820 Inflamed seborrheic keratosis: Secondary | ICD-10-CM

## 2020-09-25 DIAGNOSIS — L821 Other seborrheic keratosis: Secondary | ICD-10-CM

## 2020-09-25 MED ORDER — MOMETASONE FUROATE 0.1 % EX SOLN: CUTANEOUS | 3 refills | Status: DC

## 2020-09-25 NOTE — Patient Instructions (Signed)

## 2020-09-25 NOTE — Progress Notes (Signed)
Follow-Up Visit   Subjective  [name] [name] is a 70 y.o. male who presents for the following: Annual Exam (Hx psoriasis - patient c/o itchy scalp and is using Dermotic oil on the ears and Mometasone cream. ). The patient presents for Total-Body Skin Exam (TBSE) for skin cancer screening and mole check.  The following portions of the chart were reviewed this encounter and updated as appropriate:   Tobacco  Allergies  Meds  Problems  Med Hx  Surg Hx  Fam Hx     Review of Systems:  No other skin or systemic complaints except as noted in HPI or Assessment and Plan.  Objective  Well appearing patient in no apparent distress; mood and affect are within normal limits.  A full examination was performed including scalp, head, eyes, ears, nose, lips, neck, chest, axillae, abdomen, back, buttocks, bilateral upper extremities, bilateral lower extremities, hands, feet, fingers, toes, fingernails, and toenails. All findings within normal limits unless otherwise noted below.  Scalp, ears, knees Well-demarcated erythematous papules/plaques with silvery scale, guttate pink scaly papules.   Trunk x 24, L leg x 3, buttocks x 5, face x 25 (57) Erythematous keratotic or waxy stuck-on papule or plaque.   Assessment & Plan  Psoriasis Scalp, ears, knees  Psoriasis is a chronic non-curable, but treatable genetic/hereditary disease that may have other systemic features affecting other organ systems such as joints (Psoriatic Arthritis). It is associated with an increased risk of inflammatory bowel disease, heart disease, non-alcoholic fatty liver disease, and depression.    Start Mometasone solution to aa's scalp QHS wash out in the morning. Use up to 5d/wk PRN.   Continue Fluocinolone oil to aa's ears QD PRN flares.  Continue Mometasone 0.1% cream to aa's knees QD-BID PRN.   Topical steroids (such as triamcinolone, fluocinolone, fluocinonide, mometasone, clobetasol, halobetasol, betamethasone,  hydrocortisone) can cause thinning and lightening of the skin if they are used for too long in the same area. Your physician has selected the right strength medicine for your problem and area affected on the body. Please use your medication only as directed by your physician to prevent side effects.    mometasone (ELOCON) 0.1 % lotion - Scalp, ears, knees For psoriasis of the scalp apply to aa's QHS and wash out in the morning. Use up to 5d/wk PRN. Avoid f/g/a.  Related Medications Fluocinolone Acetonide (DERMOTIC) 0.01 % OIL Place 1-2 drops in ear(s) 3 (three) times a week. 1-2 gtts to aa ears for psoriasis up to 3 days a week prn flares  Inflamed seborrheic keratosis Trunk x 24, L leg x 3, buttocks x 5, face x 25  Destruction of lesion - Trunk x 24, L leg x 3, buttocks x 5, face x 25 Complexity: simple   Destruction method: cryotherapy   Informed consent: discussed and consent obtained   Timeout:  patient name, date of birth, surgical site, and procedure verified Lesion destroyed using liquid nitrogen: Yes   Region frozen until ice ball extended beyond lesion: Yes   Outcome: patient tolerated procedure well with no complications   Post-procedure details: wound care instructions given    Skin cancer screening  Lentigines - Scattered tan macules - Due to sun exposure - Benign-appering, observe - Recommend daily broad spectrum sunscreen SPF 30+ to sun-exposed areas, reapply every 2 hours as needed. - Call for any changes  Seborrheic Keratoses - Stuck-on, waxy, tan-brown papules and/or plaques  - Benign-appearing - Discussed benign etiology and prognosis. - Observe - Call  for any changes  Melanocytic Nevi - Tan-brown and/or pink-flesh-colored symmetric macules and papules - Benign appearing on exam today - Observation - Call clinic for new or changing moles - Recommend daily use of broad spectrum spf 30+ sunscreen to sun-exposed areas.   Hemangiomas - Red papules -  Discussed benign nature - Observe - Call for any changes  Actinic Damage - Chronic condition, secondary to cumulative UV/sun exposure - diffuse scaly erythematous macules with underlying dyspigmentation - Recommend daily broad spectrum sunscreen SPF 30+ to sun-exposed areas, reapply every 2 hours as needed.  - Staying in the shade or wearing long sleeves, sun glasses (UVA+UVB protection) and wide brim hats (4-inch brim around the entire circumference of the hat) are also recommended for sun protection.  - Call for new or changing lesions.  Skin cancer screening performed today.  Return in about 1 year (around 09/25/2021) for TBSE.  Luther Redo, CMA, am acting as scribe for Sarina Ser, MD . Documentation: I have reviewed the above documentation for accuracy and completeness, and I agree with the above.  Sarina Ser, MD

## 2020-09-26 ENCOUNTER — Telehealth: Payer: Self-pay

## 2020-09-26 ENCOUNTER — Telehealth: Payer: Self-pay | Admitting: Family Medicine

## 2020-09-26 ENCOUNTER — Encounter: Payer: Self-pay | Admitting: Dermatology

## 2020-09-26 NOTE — Telephone Encounter (Signed)
Pt is returning the call to the office for his lab results

## 2020-09-26 NOTE — Telephone Encounter (Addendum)
Message complete. Please see lab results from 09/24/20

## 2020-09-26 NOTE — Progress Notes (Signed)
Left voicemail for patient to return call.

## 2020-09-26 NOTE — Telephone Encounter (Signed)
Patient aware of results and verbalized understanding. Patient is concerned with the lab results for  Eosinophils Relative 0.0 - 5.0 % 10.2 High   3.8 R  5.4 High   5.6 High     Please advise.

## 2020-09-27 NOTE — Telephone Encounter (Signed)
Spoke with the patient. He is aware of Dr. Barbie Banner message below.

## 2020-09-27 NOTE — Telephone Encounter (Signed)
Elevated eosinophil counts usually indicate the patient is reacting to some sort of allergies. Nothing to worry about

## 2020-12-07 ENCOUNTER — Other Ambulatory Visit: Payer: Self-pay | Admitting: Family Medicine

## 2020-12-17 ENCOUNTER — Other Ambulatory Visit: Payer: Self-pay | Admitting: Family Medicine

## 2020-12-19 ENCOUNTER — Other Ambulatory Visit: Payer: Self-pay | Admitting: Family Medicine

## 2021-01-14 ENCOUNTER — Telehealth: Payer: Self-pay | Admitting: Internal Medicine

## 2021-01-14 ENCOUNTER — Other Ambulatory Visit: Payer: Self-pay | Admitting: Internal Medicine

## 2021-01-14 MED ORDER — FLECAINIDE ACETATE 100 MG PO TABS: ORAL_TABLET | ORAL | 2 refills | Status: DC

## 2021-01-14 NOTE — Telephone Encounter (Signed)
Spoke with [name] [name] HR is irreg Per [name] [name] last night took Flecainide 100 mg 2 tabs and around 11 pm had some improvement Per [name] [name] remains erratic today [name] [name] has taken Atenolol 50 mg this am instead of 25 mg HR running in the 80's [name] [name] also did not start taking Eliquis as was recommended Appointment made for AFIB clinic for wed 11/9 at 8:30 am and new script for Flecainide sent in via epic Will forward to Dr Lovena Le for review and recommendations ./cy

## 2021-01-14 NOTE — Telephone Encounter (Signed)
Patient c/o Palpitations:  High priority if patient c/o lightheadedness, shortness of breath, or chest pain  How long have you had palpitations/irregular HR/ Afib? Are you having the symptoms now? Since 4:00 am   Are you currently experiencing lightheadedness, SOB or CP? no  Do you have a history of afib (atrial fibrillation) or irregular heart rhythm? yes  Have you checked your BP or HR? (document readings if available): no  Are you experiencing any other symptoms? Nauseated  Patient took two flecanide, but that was an old rx from 2018. He was not sure if that medication loses its potency over time. The patient wanted to come in and be checked out

## 2021-01-16 ENCOUNTER — Ambulatory Visit (HOSPITAL_COMMUNITY): Payer: Medicare HMO | Admitting: Physician Assistant

## 2021-01-22 NOTE — Telephone Encounter (Signed)
Agree with above treatment and scheduled followup.

## 2021-01-30 NOTE — Telephone Encounter (Signed)
Lm to call back ./cy 

## 2021-02-05 DIAGNOSIS — B9689 Other specified bacterial agents as the cause of diseases classified elsewhere: Secondary | ICD-10-CM | POA: Diagnosis not present

## 2021-02-05 DIAGNOSIS — J019 Acute sinusitis, unspecified: Secondary | ICD-10-CM | POA: Diagnosis not present

## 2021-02-11 NOTE — Telephone Encounter (Signed)
Will await return call from pt ./cy

## 2021-02-19 ENCOUNTER — Other Ambulatory Visit: Payer: Self-pay | Admitting: Family Medicine

## 2021-02-26 ENCOUNTER — Telehealth: Payer: Self-pay | Admitting: Family Medicine

## 2021-02-26 NOTE — Telephone Encounter (Signed)
PA for Sildenafil Citrate 20mg  was denied and an appeal was sent in on 02/21/21.   Appeal was DENIED.   Key: [id number]

## 2021-03-13 DIAGNOSIS — H2513 Age-related nuclear cataract, bilateral: Secondary | ICD-10-CM | POA: Diagnosis not present

## 2021-03-13 DIAGNOSIS — H35371 Puckering of macula, right eye: Secondary | ICD-10-CM | POA: Diagnosis not present

## 2021-03-13 DIAGNOSIS — H18413 Arcus senilis, bilateral: Secondary | ICD-10-CM | POA: Diagnosis not present

## 2021-03-13 DIAGNOSIS — H2511 Age-related nuclear cataract, right eye: Secondary | ICD-10-CM | POA: Diagnosis not present

## 2021-03-13 DIAGNOSIS — H25013 Cortical age-related cataract, bilateral: Secondary | ICD-10-CM | POA: Diagnosis not present

## 2021-03-19 ENCOUNTER — Other Ambulatory Visit: Payer: Self-pay | Admitting: Family Medicine

## 2021-03-27 ENCOUNTER — Other Ambulatory Visit: Payer: Self-pay

## 2021-03-27 ENCOUNTER — Encounter: Payer: Self-pay | Admitting: Podiatry

## 2021-03-27 ENCOUNTER — Ambulatory Visit: Payer: Medicare HMO | Admitting: Podiatry

## 2021-03-27 DIAGNOSIS — M722 Plantar fascial fibromatosis: Secondary | ICD-10-CM | POA: Diagnosis not present

## 2021-03-27 MED ORDER — MELOXICAM 15 MG PO TABS: 15.0000 mg | ORAL_TABLET | Freq: Every day | ORAL | 3 refills | Status: DC

## 2021-03-27 MED ORDER — TRIAMCINOLONE ACETONIDE 40 MG/ML IJ SUSP
20.0000 mg | Freq: Once | INTRAMUSCULAR | Status: AC
  Administered 2021-03-27: 20 mg

## 2021-03-27 MED ORDER — METHYLPREDNISOLONE 4 MG PO TBPK: ORAL_TABLET | ORAL | 0 refills | Status: DC

## 2021-03-27 NOTE — Progress Notes (Signed)
He presents today for flareup of his Planter fasciitis states that he is leaving for a 2-week trip to [place] next week and he would like to make sure that his foot is not bothering him.  Objective: Vital signs are stable he is alert oriented x3.  Pulses are palpable.  There is no erythema edema cellulitis drainage or odor that he does have pain on direct palpation MucoClear tubercle of the right heel.  Assessment: Planter fasciitis of the right heel.  Plan: Start him on Medrol Dosepak to be followed by meloxicam injected the right heel today 20 mg Kenalog 5 mg Marcaine point maximal tenderness.  I will follow-up with him once he returns from [place] if necessary discussed appropriate shoe gear stretching size ice therapy shoe gear modifications.

## 2021-04-03 ENCOUNTER — Other Ambulatory Visit: Payer: Self-pay

## 2021-04-03 ENCOUNTER — Ambulatory Visit: Payer: Medicare HMO | Admitting: Dermatology

## 2021-04-03 DIAGNOSIS — L821 Other seborrheic keratosis: Secondary | ICD-10-CM | POA: Diagnosis not present

## 2021-04-03 DIAGNOSIS — L820 Inflamed seborrheic keratosis: Secondary | ICD-10-CM

## 2021-04-03 NOTE — Patient Instructions (Signed)

## 2021-04-03 NOTE — Progress Notes (Signed)
° °  Follow-Up Visit   Subjective  [name] [name] is a 71 y.o. male who presents for the following: Irregular skin lesions (On the L ear, face, and back - irritated, crusted, patient would like checked and treated today.). He complains of itching and irritation of the spots  The following portions of the chart were reviewed this encounter and updated as appropriate:   Tobacco   Allergies   Meds   Problems   Med Hx   Surg Hx   Fam Hx      Review of Systems:  No other skin or systemic complaints except as noted in HPI or Assessment and Plan.  Objective  Well appearing patient in no apparent distress; mood and affect are within normal limits.  A focused examination was performed including the face, ears, trunk, extremities. Relevant physical exam findings are noted in the Assessment and Plan.  L ear helix just above the earlobe x 1, face/neck/back x 16 (17) Erythematous stuck-on, waxy papule or plaque   Assessment & Plan  Inflamed seborrheic keratosis (17) L ear helix just above the earlobe x 1, face/neck/back x 16  Destruction of lesion - L ear helix just above the earlobe x 1, face/neck/back x 16 Complexity: simple   Destruction method: cryotherapy   Informed consent: discussed and consent obtained   Timeout:  patient name, date of birth, surgical site, and procedure verified Lesion destroyed using liquid nitrogen: Yes   Region frozen until ice ball extended beyond lesion: Yes   Outcome: patient tolerated procedure well with no complications   Post-procedure details: wound care instructions given    Seborrheic Keratoses - Stuck-on, waxy, tan-brown papules and/or plaques  - Benign-appearing - Discussed benign etiology and prognosis. - Observe - Call for any changes  Return for appointment as scheduled.  Luther Redo, CMA, am acting as scribe for Sarina Ser, MD . Documentation: I have reviewed the above documentation for accuracy and completeness, and I agree with the  above.  Sarina Ser, MD

## 2021-04-07 ENCOUNTER — Encounter: Payer: Self-pay | Admitting: Dermatology

## 2021-04-23 DIAGNOSIS — H2512 Age-related nuclear cataract, left eye: Secondary | ICD-10-CM | POA: Diagnosis not present

## 2021-04-23 DIAGNOSIS — H25042 Posterior subcapsular polar age-related cataract, left eye: Secondary | ICD-10-CM | POA: Diagnosis not present

## 2021-04-23 DIAGNOSIS — H25012 Cortical age-related cataract, left eye: Secondary | ICD-10-CM | POA: Diagnosis not present

## 2021-04-23 DIAGNOSIS — H2511 Age-related nuclear cataract, right eye: Secondary | ICD-10-CM | POA: Diagnosis not present

## 2021-04-29 ENCOUNTER — Other Ambulatory Visit: Payer: Medicare HMO

## 2021-05-02 DIAGNOSIS — H35371 Puckering of macula, right eye: Secondary | ICD-10-CM | POA: Diagnosis not present

## 2021-05-03 ENCOUNTER — Ambulatory Visit: Payer: Medicare HMO

## 2021-05-03 ENCOUNTER — Other Ambulatory Visit: Payer: Self-pay

## 2021-05-03 DIAGNOSIS — M7742 Metatarsalgia, left foot: Secondary | ICD-10-CM

## 2021-05-03 DIAGNOSIS — M2042 Other hammer toe(s) (acquired), left foot: Secondary | ICD-10-CM

## 2021-05-03 DIAGNOSIS — E119 Type 2 diabetes mellitus without complications: Secondary | ICD-10-CM

## 2021-05-03 DIAGNOSIS — M2041 Other hammer toe(s) (acquired), right foot: Secondary | ICD-10-CM

## 2021-05-03 DIAGNOSIS — M7741 Metatarsalgia, right foot: Secondary | ICD-10-CM

## 2021-05-03 DIAGNOSIS — M79673 Pain in unspecified foot: Secondary | ICD-10-CM

## 2021-05-03 NOTE — Progress Notes (Signed)
SITUATION Reason for Consult: Follow-up with custom L3020 foot orthotics Patient / Caregiver Report: Patient is not interested in diabetic shoes and insoles at this time  OBJECTIVE DATA History / Diagnosis:    ICD-10-CM   1. Metatarsalgia of both feet  M77.41    M77.42     2. Diabetes mellitus without complication ([place])  T94.9     3. Hammertoe of left foot  M20.42     4. Hammertoe of right foot  M20.41       Change in Pathology: Patient reports pain in his metatarsal heads  ACTIONS PERFORMED Patient's equipment was checked for structural stability and fit. Patient would like his devices refurbished to full foot length size 12W with padding added for met heads. All questions answered and concerns addressed.  PLAN Patient to be contacted when refurbished orthotics ready. Plan of care discussed with and agreed upon by patient / caregiver.

## 2021-05-07 ENCOUNTER — Telehealth: Payer: Self-pay

## 2021-05-07 DIAGNOSIS — H31091 Other chorioretinal scars, right eye: Secondary | ICD-10-CM | POA: Diagnosis not present

## 2021-05-07 DIAGNOSIS — H35412 Lattice degeneration of retina, left eye: Secondary | ICD-10-CM | POA: Diagnosis not present

## 2021-05-07 DIAGNOSIS — H35371 Puckering of macula, right eye: Secondary | ICD-10-CM | POA: Diagnosis not present

## 2021-05-07 DIAGNOSIS — H3581 Retinal edema: Secondary | ICD-10-CM | POA: Diagnosis not present

## 2021-05-07 DIAGNOSIS — H43391 Other vitreous opacities, right eye: Secondary | ICD-10-CM | POA: Diagnosis not present

## 2021-05-07 DIAGNOSIS — H43813 Vitreous degeneration, bilateral: Secondary | ICD-10-CM | POA: Diagnosis not present

## 2021-05-07 NOTE — Telephone Encounter (Signed)
Existing foot orthotics sent to [facility] for refurbishment

## 2021-05-08 ENCOUNTER — Other Ambulatory Visit: Payer: Medicare HMO

## 2021-05-14 ENCOUNTER — Other Ambulatory Visit: Payer: Self-pay

## 2021-05-14 ENCOUNTER — Encounter: Payer: Self-pay | Admitting: Emergency Medicine

## 2021-05-14 ENCOUNTER — Emergency Department: Payer: Medicare HMO

## 2021-05-14 ENCOUNTER — Emergency Department
Admission: EM | Admit: 2021-05-14 | Discharge: 2021-05-14 | Disposition: A | Discharge status: Home / Self Care | Payer: Medicare HMO | Attending: Emergency Medicine | Admitting: Emergency Medicine

## 2021-05-14 DIAGNOSIS — N189 Chronic kidney disease, unspecified: Secondary | ICD-10-CM | POA: Diagnosis not present

## 2021-05-14 DIAGNOSIS — I129 Hypertensive chronic kidney disease with stage 1 through stage 4 chronic kidney disease, or unspecified chronic kidney disease: Secondary | ICD-10-CM | POA: Insufficient documentation

## 2021-05-14 DIAGNOSIS — D72829 Elevated white blood cell count, unspecified: Secondary | ICD-10-CM | POA: Insufficient documentation

## 2021-05-14 DIAGNOSIS — E1122 Type 2 diabetes mellitus with diabetic chronic kidney disease: Secondary | ICD-10-CM | POA: Insufficient documentation

## 2021-05-14 DIAGNOSIS — N202 Calculus of kidney with calculus of ureter: Secondary | ICD-10-CM | POA: Diagnosis not present

## 2021-05-14 DIAGNOSIS — K5730 Diverticulosis of large intestine without perforation or abscess without bleeding: Secondary | ICD-10-CM | POA: Diagnosis not present

## 2021-05-14 DIAGNOSIS — N200 Calculus of kidney: Secondary | ICD-10-CM | POA: Diagnosis not present

## 2021-05-14 DIAGNOSIS — N132 Hydronephrosis with renal and ureteral calculous obstruction: Secondary | ICD-10-CM | POA: Diagnosis not present

## 2021-05-14 DIAGNOSIS — R109 Unspecified abdominal pain: Secondary | ICD-10-CM | POA: Diagnosis not present

## 2021-05-14 LAB — BASIC METABOLIC PANEL
Anion gap: 6 (ref 5–15)
BUN: 31 mg/dL — ABNORMAL HIGH (ref 8–23)
CO2: 27 mmol/L (ref 22–32)
Calcium: 8.6 mg/dL — ABNORMAL LOW (ref 8.9–10.3)
Chloride: 103 mmol/L (ref 98–111)
Creatinine, Ser: 1.09 mg/dL (ref 0.61–1.24)
GFR, Estimated: >60 mL/min (ref >60)
Glucose, Bld: 166 mg/dL — ABNORMAL HIGH (ref 70–99)
Potassium: 4.3 mmol/L (ref 3.5–5.1)
Sodium: 136 mmol/L (ref 135–145)

## 2021-05-14 LAB — URINALYSIS, COMPLETE (UACMP) WITH MICROSCOPIC
Bilirubin Urine: NEGATIVE
Glucose, UA: NEGATIVE mg/dL
Ketones, ur: 5 mg/dL — AB
Leukocytes,Ua: NEGATIVE
Nitrite: NEGATIVE
Protein, ur: 100 mg/dL — AB
RBC / HPF: >50 RBC/hpf — ABNORMAL HIGH (ref 0–5)
Specific Gravity, Urine: 1.025 (ref 1.005–1.030)
Squamous Epithelial / HPF: NONE SEEN (ref 0–5)
pH: 5 (ref 5.0–8.0)

## 2021-05-14 LAB — CBC WITH DIFFERENTIAL/PLATELET
Abs Immature Granulocytes: 0.05 10*3/uL (ref 0.00–0.07)
Basophils Absolute: 0.1 10*3/uL (ref 0.0–0.1)
Basophils Relative: 1 %
Eosinophils Absolute: 0.3 10*3/uL (ref 0.0–0.5)
Eosinophils Relative: 3 %
HCT: 38.4 % — ABNORMAL LOW (ref 39.0–52.0)
Hemoglobin: 11.9 g/dL — ABNORMAL LOW (ref 13.0–17.0)
Immature Granulocytes: 1 %
Lymphocytes Relative: 10 %
Lymphs Abs: 1.1 10*3/uL (ref 0.7–4.0)
MCH: 24.6 pg — ABNORMAL LOW (ref 26.0–34.0)
MCHC: 31 g/dL (ref 30.0–36.0)
MCV: 79.3 fL — ABNORMAL LOW (ref 80.0–100.0)
Monocytes Absolute: 0.6 10*3/uL (ref 0.1–1.0)
Monocytes Relative: 6 %
Neutro Abs: 9 10*3/uL — ABNORMAL HIGH (ref 1.7–7.7)
Neutrophils Relative %: 79 %
Platelets: 183 10*3/uL (ref 150–400)
RBC: 4.84 MIL/uL (ref 4.22–5.81)
RDW: 14.7 % (ref 11.5–15.5)
WBC: 11.1 10*3/uL — ABNORMAL HIGH (ref 4.0–10.5)
nRBC: 0 % (ref 0.0–0.2)

## 2021-05-14 MED ORDER — CEFDINIR 300 MG PO CAPS
300.0000 mg | ORAL_CAPSULE | Freq: Two times a day (BID) | ORAL | Status: DC
  Filled 2021-05-14: qty 1

## 2021-05-14 MED ORDER — CEFDINIR 300 MG PO CAPS: 300.0000 mg | ORAL_CAPSULE | Freq: Two times a day (BID) | ORAL | 0 refills | Status: AC

## 2021-05-14 MED ORDER — ONDANSETRON HCL 4 MG/2ML IJ SOLN
4.0000 mg | Freq: Once | INTRAMUSCULAR | Status: AC
  Administered 2021-05-14: 4 mg via INTRAVENOUS
  Filled 2021-05-14: qty 2

## 2021-05-14 MED ORDER — KETOROLAC TROMETHAMINE 30 MG/ML IJ SOLN
15.0000 mg | Freq: Once | INTRAMUSCULAR | Status: AC
  Administered 2021-05-14: 15 mg via INTRAVENOUS
  Filled 2021-05-14: qty 1

## 2021-05-14 MED ORDER — METOCLOPRAMIDE HCL 5 MG/ML IJ SOLN
10.0000 mg | Freq: Once | INTRAMUSCULAR | Status: AC
  Administered 2021-05-14: 10 mg via INTRAVENOUS
  Filled 2021-05-14: qty 2

## 2021-05-14 NOTE — ED Triage Notes (Incomplete)
Patient ambulatory to triage with steady gait, appears uncomfortable; pt reports awoke at 1230am with left flank pain radiating into abd accomp by nausea; st hx kidney stones ?

## 2021-05-14 NOTE — Discharge Instructions (Addendum)
You have a kidney stone on the left side.  You also have some white blood cells in your urine which could mean you have an infection.  Please take the antibiotic twice a day for the next week.  Please follow-up with Dr. Melvyn Novas of the urologist.  It is very important that if you develop fevers or start to feel more sick or unable to eat or drink, please return to the emergency department.  You can take 400 mg of ibuprofen every 6 hours for pain. ?

## 2021-05-14 NOTE — ED Provider Notes (Signed)
? ?[facility] ?Provider Note ? ? ? Event Date/Time  ? First MD Initiated Contact with Patient 05/14/21 0411   ?  (approximate) ? ? ?History  ? ?Flank Pain ? ? ?HPI ? ?[name] [name] is a 71 y.o. male past medical history of atrial fibrillation not on anticoagulation, history of kidney stone, arthritis, pretension, hyperlipidemia presents with left-sided flank pain.  Symptoms woke him up from sleep around midnight.  Pain is located left lower back radiating around to the left lower abdomen.  Denies dysuria or hematuria.  Has felt somewhat constipated although did not have a normal bowel movement around 2 AM today.  Has had nausea but no vomiting.  Chills but no fever.  Does have a history of kidney stones that this feels similar.  Denies testicular pain or swelling. ?  ? ?Past Medical History:  ?Diagnosis Date  ? Allergy   ? Anxiety   ? Arthritis   ? Atrial fibrillation ([place])   ? sees Dr. Lovena Le  ? Cataract   ? forming  ? Chronic kidney disease   ? kidney stone  ? Depression   ? Dizziness   ? or vertigo  ? Dysrhythmia   ? atrial fibrillation, once or twice a year  ? GERD (gastroesophageal reflux disease)   ? Headache   ? eyglass flicker in eye is migraine symptoms  ? History of kidney stones   ? HLD (hyperlipidemia)   ? HTN (hypertension)   ? Osteopenia   ? Retinal tear   ? Sinus bradycardia   ? ? ?Patient Active Problem List  ? Diagnosis Date Noted  ? Osteopenia 01/28/2018  ? Left ureteral stone 11/24/2017  ? Diabetes mellitus without complication ([place]) 89/38/1017  ? ERECTILE DYSFUNCTION, NON-ORGANIC, MILD 11/20/2008  ? ATRIAL FIBRILLATION 06/02/2008  ? SINUS BRADYCARDIA 06/02/2008  ? OSTEOPENIA 06/02/2008  ? GERD 04/19/2008  ? DIZZINESS OR VERTIGO 04/19/2008  ? Hyperlipemia, mixed 10/05/2006  ? Anxiety state 10/05/2006  ? Essential hypertension 10/05/2006  ? ? ? ?Physical Exam  ?Triage Vital Signs: ?ED Triage Vitals  ?Enc Vitals Group  ?   BP 05/14/21 0406 (!) 156/99  ?   Pulse Rate 05/14/21  0406 70  ?   Resp 05/14/21 0406 18  ?   Temp 05/14/21 0406 98.5 ?F (36.9 ?C)  ?   Temp Source 05/14/21 0406 Oral  ?   SpO2 05/14/21 0406 97 %  ?   Weight 05/14/21 0406 225 lb (102.1 kg)  ?   Height 05/14/21 0406 '6\' 1"'$  (1.854 m)  ?   Head Circumference --   ?   Peak Flow --   ?   Pain Score 05/14/21 0407 6  ?   Pain Loc --   ?   Pain Edu? --   ?   Excl. in [place]? --   ? ? ?Most recent vital signs: ?Vitals:  ? 05/14/21 0406 05/14/21 0703  ?BP: (!) 156/99 (!) 145/83  ?Pulse: 70 72  ?Resp: 18 20  ?Temp: 98.5 ?F (36.9 ?C) 98.1 ?F (36.7 ?C)  ?SpO2: 97% 98%  ? ? ? ?General: Awake, patient appears uncomfortable ?CV:  Good peripheral perfusion. ?Resp:  Normal effort.  ?Abd:  No distention.  No CVA tenderness, mild tenderness in left lower quadrant, abdomen is soft ?Neuro:             Awake, Alert, Oriented x 3  ?Other:   ? ? ?ED Results / Procedures / Treatments  ?Labs ?(all labs  ordered are listed, but only abnormal results are displayed) ?Labs Reviewed  ?URINALYSIS, COMPLETE (UACMP) WITH MICROSCOPIC - Abnormal; Notable for the following components:  ?    Result Value  ? Color, Urine AMBER (*)   ? APPearance CLOUDY (*)   ? Hgb urine dipstick LARGE (*)   ? Ketones, ur 5 (*)   ? Protein, ur 100 (*)   ? RBC / HPF >50 (*)   ? Bacteria, UA RARE (*)   ? All other components within normal limits  ?CBC WITH DIFFERENTIAL/PLATELET - Abnormal; Notable for the following components:  ? WBC 11.1 (*)   ? Hemoglobin 11.9 (*)   ? HCT 38.4 (*)   ? MCV 79.3 (*)   ? MCH 24.6 (*)   ? Neutro Abs 9.0 (*)   ? All other components within normal limits  ?BASIC METABOLIC PANEL - Abnormal; Notable for the following components:  ? Glucose, Bld 166 (*)   ? BUN 31 (*)   ? Calcium 8.6 (*)   ? All other components within normal limits  ?URINE CULTURE  ? ? ? ?EKG ? ? ? ? ?RADIOLOGY ?Reviewed the patient's CTA renal study which shows a left 6 mm stone in the left UPJ ? ? ?PROCEDURES: ? ?Critical Care performed: No ? ?Procedures ? ?The patient is on the cardiac  monitor to evaluate for evidence of arrhythmia and/or significant heart rate changes. ? ? ?MEDICATIONS ORDERED IN ED: ?Medications  ?cefdinir (OMNICEF) capsule 300 mg (has no administration in time range)  ?ketorolac (TORADOL) 30 MG/ML injection 15 mg (15 mg Intravenous Given 05/14/21 0508)  ?ondansetron [facility]) injection 4 mg (4 mg Intravenous Given 05/14/21 0508)  ?metoCLOPramide (REGLAN) injection 10 mg (10 mg Intravenous Given 05/14/21 0654)  ? ? ? ?IMPRESSION / MDM / ASSESSMENT AND PLAN / ED COURSE  ?I reviewed the triage vital signs and the nursing notes. ?             ?               ? ?Differential diagnosis includes, but is not limited to, kidney stone, AAA, pyelonephritis, musculoskeletal ? ?Is a 71 year old male presenting with acute onset of left flank/back pain.  Other symptoms include chills nausea and some constipation.  Has a history of kidney stones that this feels similar.  On exam he does appear somewhat uncomfortable.  He is hypertensive but otherwise vitals are within normal limits.  No real CVA tenderness or focal bony midline tenderness of the back.  Abdomen is benign but does have some mild left lower quadrant tenderness.  I suspect acute nephrolithiasis given his history of similar.  Will obtain UA CBC BMP and a CT renal study.  We will treat pain with Toradol and nausea with Zofran. ? ?Patient CT does show a 6 mm stone just below the left UPJ with obstruction.  Labs are notable for leukocytosis of 11, urine with greater than 50 RBCs 10-20 WBCs with rare bacteria.  Patient is not febrile does not appear clinically septic.  He is pain is now resolved after Toradol.  Still complaining of some nausea.  Will discuss with urology but I am not concerned clinically for septic stone.  Do want him to have good follow-up.  We will give him cefdinir given the WBCs. ?  ?Spoke with Dr. Bernardo Heater who agrees with discharge with her current precautions.  Patient will be referred to urology. ? ? ?FINAL CLINICAL  IMPRESSION(S) / ED DIAGNOSES  ? ?  Final diagnoses:  ?Kidney stone  ? ? ? ?Rx / DC Orders  ? ?ED Discharge Orders   ? ?      Ordered  ?  cefdinir (OMNICEF) 300 MG capsule  2 times daily       ? 05/14/21 0654  ? ?  ?  ? ?  ? ? ? ?Note:  This document was prepared using Dragon voice recognition software and may include unintentional dictation errors. ?  ?Rada Hay, MD ?05/14/21 0730 ? ?

## 2021-05-15 ENCOUNTER — Other Ambulatory Visit: Payer: Self-pay | Admitting: Urology

## 2021-05-15 ENCOUNTER — Ambulatory Visit: Payer: Medicare HMO | Admitting: Urology

## 2021-05-15 ENCOUNTER — Other Ambulatory Visit: Payer: Self-pay

## 2021-05-15 ENCOUNTER — Encounter: Payer: Self-pay | Admitting: Urology

## 2021-05-15 ENCOUNTER — Ambulatory Visit
Admission: RE | Admit: 2021-05-15 | Discharge: 2021-05-15 | Disposition: A | Discharge status: Home / Self Care | Payer: Medicare HMO | Source: Ambulatory Visit | Attending: Urology | Admitting: Urology

## 2021-05-15 ENCOUNTER — Other Ambulatory Visit: Payer: Self-pay | Admitting: Family Medicine

## 2021-05-15 ENCOUNTER — Ambulatory Visit
Admission: RE | Admit: 2021-05-15 | Discharge: 2021-05-15 | Disposition: A | Discharge status: Home / Self Care | Payer: Medicare HMO | Attending: Urology | Admitting: Urology

## 2021-05-15 VITALS — BP 171/90 | HR 58 | Ht 73.0 in | Wt 225.0 lb

## 2021-05-15 DIAGNOSIS — N201 Calculus of ureter: Secondary | ICD-10-CM | POA: Insufficient documentation

## 2021-05-15 DIAGNOSIS — R109 Unspecified abdominal pain: Secondary | ICD-10-CM | POA: Diagnosis not present

## 2021-05-15 DIAGNOSIS — N132 Hydronephrosis with renal and ureteral calculous obstruction: Secondary | ICD-10-CM

## 2021-05-15 DIAGNOSIS — N23 Unspecified renal colic: Secondary | ICD-10-CM | POA: Diagnosis not present

## 2021-05-15 DIAGNOSIS — N200 Calculus of kidney: Secondary | ICD-10-CM | POA: Diagnosis not present

## 2021-05-15 LAB — URINE CULTURE: Culture: NO GROWTH

## 2021-05-15 LAB — MICROSCOPIC EXAMINATION
Bacteria, UA: NONE SEEN
RBC, Urine: >30 /hpf — AB (ref 0–2)

## 2021-05-15 LAB — URINALYSIS, COMPLETE
Bilirubin, UA: NEGATIVE
Ketones, UA: NEGATIVE
Leukocytes,UA: NEGATIVE
Nitrite, UA: NEGATIVE
Specific Gravity, UA: >1.03 — ABNORMAL HIGH (ref 1.005–1.030)
Urobilinogen, Ur: 2 mg/dL — ABNORMAL HIGH (ref 0.2–1.0)
pH, UA: 5.5 (ref 5.0–7.5)

## 2021-05-15 MED ORDER — HYDROCODONE-ACETAMINOPHEN 5-325 MG PO TABS: 1.0000 | ORAL_TABLET | ORAL | 0 refills | Status: DC | PRN

## 2021-05-15 NOTE — Progress Notes (Signed)
05/15/2021 2:27 PM   [name] [name] [birth year] [id number]  Referring provider: Laurey Morale, MD [address] [place],  [place] [address]  Chief Complaint  Patient presents with   Nephrolithiasis    HPI: [name] [name] is a 71 y.o. male who presents for evaluation of left renal colic.  Prior history stone disease last seen December 2022. SWL of a 4 mm left proximal ureteral calculus November 2019.  Had 2 ED visits for post lithotripsy colic and was scheduled for ureteroscopic removal of a distal fragment however passed to the fragment 2 days prior to the scheduled procedure [facility] ED visit 05/14/2021 with left flank pain.  Stone protocol CT showed a 6 mm left proximal ureteral calculus with mild-moderate hydronephrosis.  Urinalysis showed >50 RBC and 11-20 WBC.  Urine culture was negative. He has only been taking ibuprofen for pain and last took it yesterday but 1 hour prior to his visit had increased left flank pain Denies fever, chills, nausea or vomiting   PMH: Past Medical History:  Diagnosis Date   Allergy    Anxiety    Arthritis    Atrial fibrillation ([place])    sees Dr. Lovena Le   Cataract    forming   Chronic kidney disease    kidney stone   Depression    Dizziness    or vertigo   Dysrhythmia    atrial fibrillation, once or twice a year   GERD (gastroesophageal reflux disease)    Headache    eyglass flicker in eye is migraine symptoms   History of kidney stones    HLD (hyperlipidemia)    HTN (hypertension)    Osteopenia    Retinal tear    Sinus bradycardia     Surgical History: Past Surgical History:  Procedure Laterality Date   CARDIAC CATHETERIZATION  2003   normal   COLONOSCOPY  01/04/2018   per Dr. Fuller Plan, adenomatous  polyp, repeat in 5 years   [place] LITHOTRIPSY Left 01/21/2018   Procedure: EXTRACORPOREAL SHOCK WAVE LITHOTRIPSY (ESWL);  Surgeon: Hollice Espy, MD;  Location: [facility] ORS;  Service: Urology;  Laterality:  Left;   EYE SURGERY     retinal   POLYPECTOMY     RETINAL LASER PROCEDURE Right    repair retinal tear    WART REMOVAL     LEFT UPPER ARM   WISDOM TOOTH EXTRACTION      Home Medications:  Allergies as of 05/15/2021       Reactions   Oxycodone Nausea And Vomiting   Paroxetine Nausea And Vomiting, Other (See Comments)   Altered mental state   Sulfa Antibiotics Hives        Medication List        Accurate as of May 15, 2021  2:27 PM. If you have any questions, ask your nurse or doctor.          aspirin EC 325 MG tablet Take 1 tablet (325 mg total) by mouth daily.   atenolol 25 MG tablet Commonly known as: TENORMIN Take 1 tablet (25 mg total) by mouth 2 (two) times daily.   atorvastatin 40 MG tablet Commonly known as: LIPITOR Take 1 tablet (40 mg total) by mouth daily.   Besivance 0.6 % Susp Generic drug: Besifloxacin HCl Apply 1 drop to eye 3 (three) times daily.   Calcium Carbonate-Vitamin D 600-400 MG-UNIT tablet Take 1 tablet by mouth daily.   cefdinir 300 MG capsule Commonly known as: OMNICEF Take  1 capsule (300 mg total) by mouth 2 (two) times daily for 7 days.   Durezol 0.05 % Emul Generic drug: Difluprednate Place 1 drop into the right eye 3 (three) times daily.   Flarex 0.1 % ophthalmic suspension Generic drug: fluorometholone Apply to eye.   flecainide 100 MG tablet Commonly known as: TAMBOCOR MAY TAKE TWO TABLETS BY MOUTH DAILY AS NEEDED FOR BREAKTHROUGH A-FIB   Fluocinolone Acetonide 0.01 % Oil Commonly known as: DermOtic Place 1-2 drops in ear(s) 3 (three) times a week. 1-2 gtts to aa ears for psoriasis up to 3 days a week prn flares   fluticasone 50 MCG/ACT nasal spray Commonly known as: FLONASE Place 2 sprays into both nostrils daily as needed for allergies.   ipratropium 0.06 % nasal spray Commonly known as: ATROVENT SMARTSIG:2 Spray(s) Both Nares 3 Times Daily PRN   lisinopril 5 MG tablet Commonly known as: ZESTRIL Take 1  tablet by mouth once daily   meloxicam 15 MG tablet Commonly known as: MOBIC Take 1 tablet (15 mg total) by mouth daily.   methylPREDNISolone 4 MG Tbpk tablet Commonly known as: MEDROL DOSEPAK 6 day dose pack - take as directed   mometasone 0.1 % lotion Commonly known as: ELOCON For psoriasis of the scalp apply to aa's QHS and wash out in the morning. Use up to 5d/wk PRN. Avoid f/g/a.   Prolensa 0.07 % Soln Generic drug: Bromfenac Sodium Apply 1 drop to eye 2 (two) times daily.   sildenafil 20 MG tablet Commonly known as: REVATIO TAKE 5 TABLETS BY MOUTH AS NEEDED   SYSTANE BALANCE OP Place 1 drop into both eyes daily as needed (dry eyes).   venlafaxine XR 37.5 MG 24 hr capsule Commonly known as: EFFEXOR-XR TAKE 1 CAPSULE BY MOUTH ONCE DAILY WITH BREAKFAST        Allergies:  Allergies  Allergen Reactions   Oxycodone Nausea And Vomiting   Paroxetine Nausea And Vomiting and Other (See Comments)    Altered mental state   Sulfa Antibiotics Hives    Family History: Family History  Problem Relation Age of Onset   Hodgkin's lymphoma Father    Hyperlipidemia Other    Hypertension Mother    Stroke Mother    Heart attack Brother    Prostate cancer Brother        1st degree relative <50   Diabetes Brother        1st degree relative   Multiple myeloma Brother    Hypertension Brother    Heart disease Brother    Heart attack Brother    Colon polyps Brother    Colon cancer Neg Hx    Esophageal cancer Neg Hx    Rectal cancer Neg Hx    Stomach cancer Neg Hx     Social History:  reports that he quit smoking about 46 years ago. His smoking use included cigarettes. He has never used smokeless tobacco. He reports current alcohol use. He reports that he does not use drugs.   Physical Exam: BP (!) 171/90    Pulse (!) 58    Ht 6' 1" (1.854 m)    Wt 225 lb (102.1 kg)    BMI 29.69 kg/m   Constitutional:  Alert and oriented, No acute distress. HEENT: [place] AT, moist mucus  membranes.  Trachea midline, no masses. Cardiovascular: No clubbing, cyanosis, or edema. Respiratory: Normal respiratory effort, no increased work of breathing. Neurologic: Grossly intact, no focal deficits, moving all 4 extremities. Psychiatric: Normal mood  and affect.  Laboratory Data:  Urinalysis Microscopy >30 RBC  Pertinent Imaging: CT images were personally reviewed and interpreted.  6 mm left proximal calculus with density 1400 HU.  SSD <10 cm.  Previsit KUB was performed and reviewed.  Dense left ureteral calculus easily seen  DG Abd 1 View  Narrative CLINICAL DATA:  Morning of procedure-pre lithotripsy left ureteral calculus  EXAM: ABDOMEN - 1 VIEW  COMPARISON:  01/08/2018  FINDINGS: Bowel gas pattern is nonobstructed. No new calcifications identified in the expected location of the kidneys.  Within the pelvis, there are numerous prostatic and vascular calcifications. 4 millimeter calculus in the LEFT hemipelvis is stable since prior and could represent a distal ureteral stone. Otherwise the courses of the ureters are unremarkable.  IMPRESSION: Possible LEFT ureterovesical junction stone measuring 4 millimeters.   Electronically Signed By: Nolon Nations M.D. On: 01/21/2018 09:51   CT Renal Stone Study  Narrative CLINICAL DATA:  Left groin pain radiating to the back since 12:30 a.m.  EXAM: CT ABDOMEN AND PELVIS WITHOUT CONTRAST  TECHNIQUE: Multidetector CT imaging of the abdomen and pelvis was performed following the standard protocol without IV contrast.  RADIATION DOSE REDUCTION: This exam was performed according to the departmental dose-optimization program which includes automated exposure control, adjustment of the mA and/or kV according to patient size and/or use of iterative reconstruction technique.  COMPARISON:  01/27/2018  FINDINGS: Lower chest:  Coronary atherosclerosis.  Hepatobiliary: No focal liver abnormality.No evidence of  biliary obstruction or stone.  Pancreas: Unremarkable.  Spleen: Unremarkable.  Adrenals/Urinary Tract: Negative adrenals. Left hydronephrosis and renal expansion with perinephric stranding due to a 6 mm stone just below the UPJ. Punctate left lower pole renal calculus. Unremarkable bladder.  Stomach/Bowel: No obstruction. No appendicitis. Sigmoid diverticulosis.  Vascular/Lymphatic: No acute vascular abnormality. Atheromatous calcification of the aorta and iliacs. No mass or adenopathy.  Reproductive:Prostate calcifications.  Other: No ascites or pneumoperitoneum.  Musculoskeletal: No acute abnormalities. Iliopsoas ganglion on the left. L4-5 and L5-S1 disc degeneration.  IMPRESSION: 1. Obstructing 6 mm stone just below the left UPJ. 2. Punctate left lower pole calculus. 3. Atherosclerosis including the coronary arteries. Sigmoid diverticulosis.   Electronically Signed By: Jorje Guild M.D. On: 05/14/2021 05:30   Assessment & Plan:    1.  Left proximal ureteral calculus We discussed various treatment options for urolithiasis including observation with or without medical expulsive therapy, shockwave lithotripsy (SWL), ureteroscopy and laser lithotripsy with stent placement. We discussed that management is based on stone size, location, density, patient co-morbidities, and patient preference.  Stones <51m in size have a >80% spontaneous passage rate. Data surrounding the use of tamsulosin for medical expulsive therapy is controversial, but meta analyses suggests it is most efficacious for distal stones between 5-14min size. Possible side effects include dizziness/lightheadedness, and retrograde ejaculation. SWL has a lower stone free rate in a single procedure, but also a lower complication rate compared to ureteroscopy and avoids a stent and associated stent related symptoms. Possible complications include renal hematoma, steinstrasse, and need for additional  treatment. Ureteroscopy with laser lithotripsy and stent placement has a higher stone free rate than SWL in a single procedure, however increased complication rate including possible infection, ureteral injury, bleeding, and stent related morbidity. Common stent related symptoms include dysuria, urgency/frequency, and flank pain. We discussed due to stone density SWL would have a lower success rate.  He had 2 ED visits for colic after prior  SWL of a smaller calculus.  He is  scheduled for eye surgery early next week.  After an extensive discussion of the risks and benefits of the above treatment options, the patient would like to proceed with ureteroscopic stone removal.    Abbie Sons, [place] [address] [place], [place] [address] [phone]

## 2021-05-15 NOTE — H&P (View-Only) (Signed)
05/15/2021 2:27 PM   [name] [name] 1951-01-02 [id number]  Referring provider: Laurey Morale, MD [address] [place],  [place] [address]  Chief Complaint  Patient presents with   Nephrolithiasis    HPI: [name] [name] is a 71 y.o. male who presents for evaluation of left renal colic.  Prior history stone disease last seen December 2022. SWL of a 4 mm left proximal ureteral calculus November 2019.  Had 2 ED visits for post lithotripsy colic and was scheduled for ureteroscopic removal of a distal fragment however passed to the fragment 2 days prior to the scheduled procedure [facility] ED visit 05/14/2021 with left flank pain.  Stone protocol CT showed a 6 mm left proximal ureteral calculus with mild-moderate hydronephrosis.  Urinalysis showed >50 RBC and 11-20 WBC.  Urine culture was negative. He has only been taking ibuprofen for pain and last took it yesterday but 1 hour prior to his visit had increased left flank pain Denies fever, chills, nausea or vomiting   PMH: Past Medical History:  Diagnosis Date   Allergy    Anxiety    Arthritis    Atrial fibrillation ([place])    sees Dr. Lovena Le   Cataract    forming   Chronic kidney disease    kidney stone   Depression    Dizziness    or vertigo   Dysrhythmia    atrial fibrillation, once or twice a year   GERD (gastroesophageal reflux disease)    Headache    eyglass flicker in eye is migraine symptoms   History of kidney stones    HLD (hyperlipidemia)    HTN (hypertension)    Osteopenia    Retinal tear    Sinus bradycardia     Surgical History: Past Surgical History:  Procedure Laterality Date   CARDIAC CATHETERIZATION  2003   normal   COLONOSCOPY  01/04/2018   per Dr. Fuller Plan, adenomatous  polyp, repeat in 5 years   [place] LITHOTRIPSY Left 01/21/2018   Procedure: EXTRACORPOREAL SHOCK WAVE LITHOTRIPSY (ESWL);  Surgeon: Hollice Espy, MD;  Location: [facility] ORS;  Service: Urology;  Laterality:  Left;   EYE SURGERY     retinal   POLYPECTOMY     RETINAL LASER PROCEDURE Right    repair retinal tear    WART REMOVAL     LEFT UPPER ARM   WISDOM TOOTH EXTRACTION      Home Medications:  Allergies as of 05/15/2021       Reactions   Oxycodone Nausea And Vomiting   Paroxetine Nausea And Vomiting, Other (See Comments)   Altered mental state   Sulfa Antibiotics Hives        Medication List        Accurate as of May 15, 2021  2:27 PM. If you have any questions, ask your nurse or doctor.          aspirin EC 325 MG tablet Take 1 tablet (325 mg total) by mouth daily.   atenolol 25 MG tablet Commonly known as: TENORMIN Take 1 tablet (25 mg total) by mouth 2 (two) times daily.   atorvastatin 40 MG tablet Commonly known as: LIPITOR Take 1 tablet (40 mg total) by mouth daily.   Besivance 0.6 % Susp Generic drug: Besifloxacin HCl Apply 1 drop to eye 3 (three) times daily.   Calcium Carbonate-Vitamin D 600-400 MG-UNIT tablet Take 1 tablet by mouth daily.   cefdinir 300 MG capsule Commonly known as: OMNICEF Take  1 capsule (300 mg total) by mouth 2 (two) times daily for 7 days.   Durezol 0.05 % Emul Generic drug: Difluprednate Place 1 drop into the right eye 3 (three) times daily.   Flarex 0.1 % ophthalmic suspension Generic drug: fluorometholone Apply to eye.   flecainide 100 MG tablet Commonly known as: TAMBOCOR MAY TAKE TWO TABLETS BY MOUTH DAILY AS NEEDED FOR BREAKTHROUGH A-FIB   Fluocinolone Acetonide 0.01 % Oil Commonly known as: DermOtic Place 1-2 drops in ear(s) 3 (three) times a week. 1-2 gtts to aa ears for psoriasis up to 3 days a week prn flares   fluticasone 50 MCG/ACT nasal spray Commonly known as: FLONASE Place 2 sprays into both nostrils daily as needed for allergies.   ipratropium 0.06 % nasal spray Commonly known as: ATROVENT SMARTSIG:2 Spray(s) Both Nares 3 Times Daily PRN   lisinopril 5 MG tablet Commonly known as: ZESTRIL Take 1  tablet by mouth once daily   meloxicam 15 MG tablet Commonly known as: MOBIC Take 1 tablet (15 mg total) by mouth daily.   methylPREDNISolone 4 MG Tbpk tablet Commonly known as: MEDROL DOSEPAK 6 day dose pack - take as directed   mometasone 0.1 % lotion Commonly known as: ELOCON For psoriasis of the scalp apply to aa's QHS and wash out in the morning. Use up to 5d/wk PRN. Avoid f/g/a.   Prolensa 0.07 % Soln Generic drug: Bromfenac Sodium Apply 1 drop to eye 2 (two) times daily.   sildenafil 20 MG tablet Commonly known as: REVATIO TAKE 5 TABLETS BY MOUTH AS NEEDED   SYSTANE BALANCE OP Place 1 drop into both eyes daily as needed (dry eyes).   venlafaxine XR 37.5 MG 24 hr capsule Commonly known as: EFFEXOR-XR TAKE 1 CAPSULE BY MOUTH ONCE DAILY WITH BREAKFAST        Allergies:  Allergies  Allergen Reactions   Oxycodone Nausea And Vomiting   Paroxetine Nausea And Vomiting and Other (See Comments)    Altered mental state   Sulfa Antibiotics Hives    Family History: Family History  Problem Relation Age of Onset   Hodgkin's lymphoma Father    Hyperlipidemia Other    Hypertension Mother    Stroke Mother    Heart attack Brother    Prostate cancer Brother        1st degree relative <50   Diabetes Brother        1st degree relative   Multiple myeloma Brother    Hypertension Brother    Heart disease Brother    Heart attack Brother    Colon polyps Brother    Colon cancer Neg Hx    Esophageal cancer Neg Hx    Rectal cancer Neg Hx    Stomach cancer Neg Hx     Social History:  reports that he quit smoking about 46 years ago. His smoking use included cigarettes. He has never used smokeless tobacco. He reports current alcohol use. He reports that he does not use drugs.   Physical Exam: BP (!) 171/90    Pulse (!) 58    Ht 6' 1" (1.854 m)    Wt 225 lb (102.1 kg)    BMI 29.69 kg/m   Constitutional:  Alert and oriented, No acute distress. HEENT: [place] AT, moist mucus  membranes.  Trachea midline, no masses. Cardiovascular: No clubbing, cyanosis, or edema. Respiratory: Normal respiratory effort, no increased work of breathing. Neurologic: Grossly intact, no focal deficits, moving all 4 extremities. Psychiatric: Normal mood  affect. ° °Laboratory Data: ° °Urinalysis °Microscopy >30 RBC ° °Pertinent Imaging: °CT images were personally reviewed and interpreted.  6 mm left proximal calculus with density 1400 HU.  SSD <10 cm.  Previsit KUB was performed and reviewed.  Dense left ureteral calculus easily seen ° °DG Abd 1 View ° °Narrative °CLINICAL DATA:  Morning of procedure-pre lithotripsy left ureteral °calculus ° °EXAM: °ABDOMEN - 1 VIEW ° °COMPARISON:  01/08/2018 ° °FINDINGS: °Bowel gas pattern is nonobstructed. No new calcifications identified °in the expected location of the kidneys. ° °Within the pelvis, there are numerous prostatic and vascular °calcifications. 4 millimeter calculus in the LEFT hemipelvis is °stable since prior and could represent a distal ureteral stone. °Otherwise the courses of the ureters are unremarkable. ° °IMPRESSION: °Possible LEFT ureterovesical junction stone measuring 4 millimeters. ° ° °Electronically Signed °By: Elizabeth  Brown M.D. °On: 01/21/2018 09:51 ° ° °CT Renal Stone Study ° °Narrative °CLINICAL DATA:  Left groin pain radiating to the back since 12:30 °a.m. ° °EXAM: °CT ABDOMEN AND PELVIS WITHOUT CONTRAST ° °TECHNIQUE: °Multidetector CT imaging of the abdomen and pelvis was performed °following the standard protocol without IV contrast. ° °RADIATION DOSE REDUCTION: This exam was performed according to the °departmental dose-optimization program which includes automated °exposure control, adjustment of the mA and/or kV according to °patient size and/or use of iterative reconstruction technique. ° °COMPARISON:  01/27/2018 ° °FINDINGS: °Lower chest:  Coronary atherosclerosis. ° °Hepatobiliary: No focal liver abnormality.No evidence of  biliary °obstruction or stone. ° °Pancreas: Unremarkable. ° °Spleen: Unremarkable. ° °Adrenals/Urinary Tract: Negative adrenals. Left hydronephrosis and °renal expansion with perinephric stranding due to a 6 mm stone just °below the UPJ. Punctate left lower pole renal calculus. Unremarkable °bladder. ° °Stomach/Bowel: No obstruction. No appendicitis. Sigmoid °diverticulosis. ° °Vascular/Lymphatic: No acute vascular abnormality. Atheromatous °calcification of the aorta and iliacs. No mass or adenopathy. ° °Reproductive:Prostate calcifications. ° °Other: No ascites or pneumoperitoneum. ° °Musculoskeletal: No acute abnormalities. Iliopsoas ganglion on the °left. L4-5 and L5-S1 disc degeneration. ° °IMPRESSION: °1. Obstructing 6 mm stone just below the left UPJ. °2. Punctate left lower pole calculus. °3. Atherosclerosis including the coronary arteries. Sigmoid °diverticulosis. ° ° °Electronically Signed °By: Jonathan  Watts M.D. °On: 05/14/2021 05:30 ° ° °Assessment & Plan:   ° °1.  Left proximal ureteral calculus °We discussed various treatment options for urolithiasis including observation with or without medical expulsive therapy, shockwave lithotripsy (SWL), ureteroscopy and laser lithotripsy with stent placement. °We discussed that management is based on stone size, location, density, patient co-morbidities, and patient preference.  °Stones <5mm in size have a >80% spontaneous passage rate. Data surrounding the use of tamsulosin for medical expulsive therapy is controversial, but meta analyses suggests it is most efficacious for distal stones between 5-10mm in size. Possible side effects include dizziness/lightheadedness, and retrograde ejaculation. °SWL has a lower stone free rate in a single procedure, but also a lower complication rate compared to ureteroscopy and avoids a stent and associated stent related symptoms. Possible complications include renal hematoma, steinstrasse, and need for additional  treatment. °Ureteroscopy with laser lithotripsy and stent placement has a higher stone free rate than SWL in a single procedure, however increased complication rate including possible infection, ureteral injury, bleeding, and stent related morbidity. Common stent related symptoms include dysuria, urgency/frequency, and flank pain. °We discussed due to stone density SWL would have a lower success rate.  He had 2 ED visits for colic after prior  SWL of a smaller calculus.  He is scheduled   for eye surgery early next week.  After an extensive discussion of the risks and benefits of the above treatment options, the patient would like to proceed with ureteroscopic stone removal.  ° ° °Breland Elders C Tarl Cephas, MD ° °[facility] °[address], [address] °[place], [place] [address] °[phone] ° °

## 2021-05-15 NOTE — Progress Notes (Signed)
[facility] Posting Form  ? ?Surgery Date/Time: Date: 05/16/2021 ? ?Surgeon: Dr. John Giovanni, MD ? ?Surgery Location: Day Surgery ? ?Inpt ( No  )   Outpt (Yes)   Obs ( No  )  ? ?Diagnosis: N20.1 Left Ureteral Stone ? ?-CPT: 25427 ? ?Surgery: Left Ureteroscopy with Laser Lithotripsy and Stent Placement ? ?Cardiac/Medical/Pulmonary Clearance needed: no ? ?*Orders entered into EPIC  Date: 05/15/21  ? ?*Case booked in [place]  Date: 05/15/21 ? ?*Notified pt of Surgery: Date: 05/15/21 ? ?PRE-OP UA & CX: no ? ?*Placed into Prior Authorization Work Que Date: 05/15/21 ? ? ?Assistant/laser/rep:No ? ? ? ? ? ? ? ? ? ? ? ? ? ? ? ?

## 2021-05-16 ENCOUNTER — Encounter: Payer: Self-pay | Admitting: Urology

## 2021-05-16 ENCOUNTER — Encounter
Admission: RE | Disposition: A | Discharge status: Home / Self Care | Payer: Self-pay | Source: Ambulatory Visit | Attending: Urology

## 2021-05-16 ENCOUNTER — Ambulatory Visit: Payer: Medicare HMO | Admitting: Urology

## 2021-05-16 ENCOUNTER — Ambulatory Visit
Admission: RE | Admit: 2021-05-16 | Discharge: 2021-05-16 | Disposition: A | Discharge status: Home / Self Care | Payer: Medicare HMO | Source: Ambulatory Visit | Attending: Urology | Admitting: Urology

## 2021-05-16 ENCOUNTER — Ambulatory Visit: Payer: Medicare HMO

## 2021-05-16 ENCOUNTER — Ambulatory Visit: Payer: Medicare HMO | Admitting: Registered Nurse

## 2021-05-16 ENCOUNTER — Other Ambulatory Visit: Payer: Self-pay

## 2021-05-16 DIAGNOSIS — E785 Hyperlipidemia, unspecified: Secondary | ICD-10-CM | POA: Insufficient documentation

## 2021-05-16 DIAGNOSIS — K219 Gastro-esophageal reflux disease without esophagitis: Secondary | ICD-10-CM | POA: Insufficient documentation

## 2021-05-16 DIAGNOSIS — Z87442 Personal history of urinary calculi: Secondary | ICD-10-CM | POA: Diagnosis not present

## 2021-05-16 DIAGNOSIS — E119 Type 2 diabetes mellitus without complications: Secondary | ICD-10-CM | POA: Diagnosis not present

## 2021-05-16 DIAGNOSIS — M1990 Unspecified osteoarthritis, unspecified site: Secondary | ICD-10-CM | POA: Diagnosis not present

## 2021-05-16 DIAGNOSIS — Z136 Encounter for screening for cardiovascular disorders: Secondary | ICD-10-CM | POA: Diagnosis not present

## 2021-05-16 DIAGNOSIS — I129 Hypertensive chronic kidney disease with stage 1 through stage 4 chronic kidney disease, or unspecified chronic kidney disease: Secondary | ICD-10-CM | POA: Insufficient documentation

## 2021-05-16 DIAGNOSIS — I4891 Unspecified atrial fibrillation: Secondary | ICD-10-CM | POA: Diagnosis not present

## 2021-05-16 DIAGNOSIS — Z87891 Personal history of nicotine dependence: Secondary | ICD-10-CM | POA: Insufficient documentation

## 2021-05-16 DIAGNOSIS — N189 Chronic kidney disease, unspecified: Secondary | ICD-10-CM | POA: Insufficient documentation

## 2021-05-16 DIAGNOSIS — N23 Unspecified renal colic: Secondary | ICD-10-CM | POA: Diagnosis not present

## 2021-05-16 DIAGNOSIS — E782 Mixed hyperlipidemia: Secondary | ICD-10-CM | POA: Diagnosis not present

## 2021-05-16 DIAGNOSIS — N201 Calculus of ureter: Secondary | ICD-10-CM | POA: Insufficient documentation

## 2021-05-16 HISTORY — PX: CYSTOSCOPY/URETEROSCOPY/HOLMIUM LASER/STENT PLACEMENT: SHX6546

## 2021-05-16 SURGERY — CYSTOSCOPY/URETEROSCOPY/HOLMIUM LASER/STENT PLACEMENT
Anesthesia: General | Laterality: Left

## 2021-05-16 MED ORDER — CHLORHEXIDINE GLUCONATE 0.12 % MT SOLN: 15.0000 mL | Freq: Once | OROMUCOSAL | Status: AC

## 2021-05-16 MED ORDER — PROPOFOL 10 MG/ML IV BOLUS
INTRAVENOUS | Status: DC | PRN
  Administered 2021-05-16: 200 mg via INTRAVENOUS

## 2021-05-16 MED ORDER — FENTANYL CITRATE (PF) 100 MCG/2ML IJ SOLN: 25.0000 ug | INTRAMUSCULAR | Status: DC | PRN

## 2021-05-16 MED ORDER — CEFAZOLIN SODIUM-DEXTROSE 2-4 GM/100ML-% IV SOLN
INTRAVENOUS | Status: AC
  Filled 2021-05-16: qty 100

## 2021-05-16 MED ORDER — ORAL CARE MOUTH RINSE: 15.0000 mL | Freq: Once | OROMUCOSAL | Status: AC

## 2021-05-16 MED ORDER — IOHEXOL 180 MG/ML  SOLN
INTRAMUSCULAR | Status: DC | PRN
  Administered 2021-05-16 (×2): 20 mL

## 2021-05-16 MED ORDER — FENTANYL CITRATE (PF) 100 MCG/2ML IJ SOLN
INTRAMUSCULAR | Status: AC
  Filled 2021-05-16: qty 2

## 2021-05-16 MED ORDER — LIDOCAINE HCL (CARDIAC) PF 100 MG/5ML IV SOSY
PREFILLED_SYRINGE | INTRAVENOUS | Status: DC | PRN
  Administered 2021-05-16: 100 mg via INTRAVENOUS

## 2021-05-16 MED ORDER — DEXAMETHASONE SODIUM PHOSPHATE 10 MG/ML IJ SOLN
INTRAMUSCULAR | Status: DC | PRN
  Administered 2021-05-16: 10 mg via INTRAVENOUS

## 2021-05-16 MED ORDER — ONDANSETRON HCL 4 MG/2ML IJ SOLN
INTRAMUSCULAR | Status: AC
  Filled 2021-05-16: qty 2

## 2021-05-16 MED ORDER — ACETAMINOPHEN 10 MG/ML IV SOLN
INTRAVENOUS | Status: AC
  Filled 2021-05-16: qty 100

## 2021-05-16 MED ORDER — ONDANSETRON HCL 4 MG/2ML IJ SOLN
INTRAMUSCULAR | Status: DC | PRN
  Administered 2021-05-16: 4 mg via INTRAVENOUS

## 2021-05-16 MED ORDER — EPHEDRINE SULFATE (PRESSORS) 50 MG/ML IJ SOLN
INTRAMUSCULAR | Status: DC | PRN
  Administered 2021-05-16 (×2): 10 mg via INTRAVENOUS

## 2021-05-16 MED ORDER — EPHEDRINE 5 MG/ML INJ
INTRAVENOUS | Status: AC
Start: 2021-05-16 — End: ?
  Filled 2021-05-16: qty 5

## 2021-05-16 MED ORDER — ONDANSETRON HCL 4 MG/2ML IJ SOLN: 4.0000 mg | Freq: Once | INTRAMUSCULAR | Status: AC

## 2021-05-16 MED ORDER — ACETAMINOPHEN 10 MG/ML IV SOLN
INTRAVENOUS | Status: DC | PRN
  Administered 2021-05-16: 1000 mg via INTRAVENOUS

## 2021-05-16 MED ORDER — ROCURONIUM BROMIDE 100 MG/10ML IV SOLN
INTRAVENOUS | Status: DC | PRN
  Administered 2021-05-16: 50 mg via INTRAVENOUS

## 2021-05-16 MED ORDER — MIDAZOLAM HCL 2 MG/2ML IJ SOLN
INTRAMUSCULAR | Status: AC
  Filled 2021-05-16: qty 2

## 2021-05-16 MED ORDER — CHLORHEXIDINE GLUCONATE 0.12 % MT SOLN
OROMUCOSAL | Status: AC
  Administered 2021-05-16: 11:00:00 15 mL via OROMUCOSAL
  Filled 2021-05-16: qty 15

## 2021-05-16 MED ORDER — DEXAMETHASONE SODIUM PHOSPHATE 10 MG/ML IJ SOLN
INTRAMUSCULAR | Status: AC
  Filled 2021-05-16: qty 1

## 2021-05-16 MED ORDER — FAMOTIDINE 20 MG PO TABS: 20.0000 mg | ORAL_TABLET | Freq: Once | ORAL | Status: AC

## 2021-05-16 MED ORDER — MIDAZOLAM HCL 2 MG/2ML IJ SOLN
INTRAMUSCULAR | Status: DC | PRN
  Administered 2021-05-16: 2 mg via INTRAVENOUS

## 2021-05-16 MED ORDER — SODIUM CHLORIDE 0.9 % IR SOLN
Status: DC | PRN
Start: 2021-05-16 — End: 2021-05-16
  Administered 2021-05-16: 2500 mL via INTRAVESICAL

## 2021-05-16 MED ORDER — FAMOTIDINE 20 MG PO TABS
ORAL_TABLET | ORAL | Status: AC
  Administered 2021-05-16: 11:00:00 20 mg via ORAL
  Filled 2021-05-16: qty 1

## 2021-05-16 MED ORDER — FENTANYL CITRATE (PF) 100 MCG/2ML IJ SOLN
INTRAMUSCULAR | Status: DC | PRN
  Administered 2021-05-16 (×2): 25 ug via INTRAVENOUS
  Administered 2021-05-16: 50 ug via INTRAVENOUS

## 2021-05-16 MED ORDER — OXYBUTYNIN CHLORIDE 5 MG PO TABS: ORAL_TABLET | ORAL | 0 refills | Status: DC

## 2021-05-16 MED ORDER — ONDANSETRON HCL 4 MG/2ML IJ SOLN
INTRAMUSCULAR | Status: AC
  Administered 2021-05-16: 15:00:00 4 mg via INTRAVENOUS
  Filled 2021-05-16: qty 2

## 2021-05-16 MED ORDER — PROPOFOL 10 MG/ML IV BOLUS
INTRAVENOUS | Status: AC
  Filled 2021-05-16: qty 20

## 2021-05-16 MED ORDER — CEFAZOLIN SODIUM-DEXTROSE 2-4 GM/100ML-% IV SOLN
2.0000 g | INTRAVENOUS | Status: AC
  Administered 2021-05-16: 12:00:00 2 g via INTRAVENOUS

## 2021-05-16 MED ORDER — LACTATED RINGERS IV SOLN: INTRAVENOUS | Status: DC

## 2021-05-16 SURGICAL SUPPLY — 30 items
BAG DRAIN CYSTO-URO LG1000N (MISCELLANEOUS) ×3 IMPLANT
BASKET ZERO TIP 1.9FR (BASKET) ×1 IMPLANT
BRUSH SCRUB EZ 1% IODOPHOR (MISCELLANEOUS) ×3 IMPLANT
BSKT STON RTRVL ZERO TP 1.9FR (BASKET) ×1
CATH ROBINSON RED A/P 16FR (CATHETERS) ×1 IMPLANT
CATH URET FLEX-TIP 2 LUMEN 10F (CATHETERS) IMPLANT
CATH URETL OPEN END 6X70 (CATHETERS) IMPLANT
CNTNR SPEC 2.5X3XGRAD LEK (MISCELLANEOUS)
CONT SPEC 4OZ STER OR WHT (MISCELLANEOUS)
CONT SPEC 4OZ STRL OR WHT (MISCELLANEOUS)
CONTAINER SPEC 2.5X3XGRAD LEK (MISCELLANEOUS) IMPLANT
DRAPE UTILITY 15X26 TOWEL STRL (DRAPES) ×3 IMPLANT
GAUZE 4X4 16PLY ~~LOC~~+RFID DBL (SPONGE) ×5 IMPLANT
GLOVE SURG UNDER POLY LF SZ7.5 (GLOVE) ×3 IMPLANT
GOWN STRL REUS W/ TWL LRG LVL3 (GOWN DISPOSABLE) ×2 IMPLANT
GOWN STRL REUS W/ TWL XL LVL3 (GOWN DISPOSABLE) ×2 IMPLANT
GOWN STRL REUS W/TWL LRG LVL3 (GOWN DISPOSABLE) ×2
GOWN STRL REUS W/TWL XL LVL3 (GOWN DISPOSABLE) ×2
GUIDEWIRE STR DUAL SENSOR (WIRE) ×3 IMPLANT
IV NS IRRIG 3000ML ARTHROMATIC (IV SOLUTION) ×3 IMPLANT
KIT TURNOVER CYSTO (KITS) ×3 IMPLANT
PACK CYSTO AR (MISCELLANEOUS) ×3 IMPLANT
SET CYSTO W/LG BORE CLAMP LF (SET/KITS/TRAYS/PACK) ×3 IMPLANT
SHEATH URETERAL 12FRX35CM (MISCELLANEOUS) IMPLANT
STENT URET 6FRX24 CONTOUR (STENTS) IMPLANT
STENT URET 6FRX26 CONTOUR (STENTS) ×1 IMPLANT
SURGILUBE 2OZ TUBE FLIPTOP (MISCELLANEOUS) ×3 IMPLANT
TRACTIP FLEXIVA PULSE ID 200 (Laser) ×2 IMPLANT
VALVE UROSEAL ADJ ENDO (VALVE) ×1 IMPLANT
WATER STERILE IRR 500ML POUR (IV SOLUTION) ×3 IMPLANT

## 2021-05-16 NOTE — Anesthesia Procedure Notes (Signed)
Procedure Name: Intubation ?Date/Time: 05/16/2021 12:17 PM ?Performed by: Doreen Salvage, CRNA ?Pre-anesthesia Checklist: Patient identified, Patient being monitored, Timeout performed, Emergency Drugs available and Suction available ?Patient Re-evaluated:Patient Re-evaluated prior to induction ?Oxygen Delivery Method: Circle system utilized ?Preoxygenation: Pre-oxygenation with 100% oxygen ?Induction Type: IV induction ?Ventilation: Mask ventilation without difficulty ?Laryngoscope Size: Mac, McGraph and 4 ?Grade View: Grade I ?Tube type: Oral ?Tube size: 7.0 mm ?Number of attempts: 1 ?Airway Equipment and Method: Stylet ?Placement Confirmation: ETT inserted through vocal cords under direct vision, positive ETCO2 and breath sounds checked- equal and bilateral ?Secured at: 22 (at the teeth) cm ?Tube secured with: Tape ?Dental Injury: Teeth and Oropharynx as per pre-operative assessment  ? ? ? ? ?

## 2021-05-16 NOTE — Discharge Instructions (Addendum)
DISCHARGE INSTRUCTIONS FOR KIDNEY STONE/URETERAL STENT  ? ?MEDICATIONS:  ?1. Resume all your other meds from home.  ?2.  AZO (over-the-counter) can help with the burning/stinging when you urinate. ?3.  May continue the hydrocodone if needed for pain ?4.  A prescription for oxybutynin was sent to your pharmacy which will help with bladder irritation/spasm ? ?ACTIVITY:  ?1. May resume regular activities in 24 hours. ?2. No driving while on narcotic pain medications  ?3. Drink plenty of water  ?4. Continue to walk at home - you can still get blood clots when you are at home, so keep active, but don't over do it.  ?5. May return to work/school tomorrow or when you feel ready  ?  ? ?SIGNS/SYMPTOMS TO CALL:  ?Common postoperative symptoms include urinary frequency, urgency, bladder spasm and blood in the urine ? ?Please call us if you have a fever greater than 101.5, uncontrolled nausea/vomiting, uncontrolled pain, dizziness, unable to urinate, excessively bloody urine, chest pain, shortness of breath, leg swelling, leg pain, or any other concerns or questions.  ? ?You can reach [place] at [phone].  ? ?FOLLOW-UP:  ?1. You we will be contacted for an appointment for stent removal next week ? ?AMBULATORY SURGERY  ?DISCHARGE INSTRUCTIONS ? ? ?The drugs that you were given will stay in your system until tomorrow so for the next 24 hours you should not: ? ?Drive an automobile ?Make any legal decisions ?Drink any alcoholic beverage ? ? ?You may resume regular meals tomorrow.  Today it is better to start with liquids and gradually work up to solid foods. ? ?You may eat anything you prefer, but it is better to start with liquids, then soup and crackers, and gradually work up to solid foods. ? ? ?Please notify your doctor immediately if you have any unusual bleeding, trouble breathing, redness and pain at the surgery site, drainage, fever, or pain not relieved by medication. ? ? ? ?Additional Instructions: ? ? ? ?Please contact  your physician with any problems or Same Day Surgery at [phone], Monday through Friday 6 am to 4 pm, or [place] at [facility] number at [phone].  ?

## 2021-05-16 NOTE — Interval H&P Note (Signed)
History and Physical Interval Note: ? ?CV: RRR ?Lungs: Clear ? ?05/16/2021 ?11:49 AM ? ?[name] [name]  has presented today for surgery, with the diagnosis of Left Ureteral Stone.  The various methods of treatment have been discussed with the patient and family. After consideration of risks, benefits and other options for treatment, the patient has consented to  Procedure(s): ?CYSTOSCOPY/URETEROSCOPY/HOLMIUM LASER/STENT PLACEMENT (Left) as a surgical intervention.  The patient's history has been reviewed, patient examined, no change in status, stable for surgery.  I have reviewed the patient's chart and labs.  Questions were answered to the patient's satisfaction.   ? ? ?[name] [name] ? ? ?

## 2021-05-16 NOTE — Transfer of Care (Signed)
Immediate Anesthesia Transfer of Care Note ? ?Patient: [name] [name] ? ?Procedure(s) Performed: Procedure(s): ?CYSTOSCOPY/URETEROSCOPY/HOLMIUM LASER/STENT PLACEMENT (Left) ? ?Patient Location: PACU ? ?Anesthesia Type:General ? ?Level of Consciousness: sedated ? ?Airway & Oxygen Therapy: Patient Spontanous Breathing and Patient connected to face mask oxygen ? ?Post-op Assessment: Report given to RN and Post -op Vital signs reviewed and stable ? ?Post vital signs: Reviewed and stable ? ?Last Vitals:  ?Vitals:  ? 05/16/21 1039 05/16/21 1338  ?BP: (!) 153/93 (!) 131/57  ?Pulse: (!) 55 (!) 53  ?Resp: 18 17  ?Temp: 36.8 ?C 36.7 ?C  ?SpO2: 99% 98%  ? ? ?Complications: No apparent anesthesia complications ?

## 2021-05-16 NOTE — Op Note (Signed)
Preoperative diagnosis:  ?Left proximal ureteral calculus ?Renal colic ? ?Postoperative diagnosis:  ?Left nephrolithiasis ? ?Procedure: ? ?Cystoscopy ?Left ureteroscopy and stone removal ?Ureteroscopic laser lithotripsy ?Left ureteral stent placement (10F/26 cm)  ?Left retrograde pyelography with interpretation ?Intraoperative fluoroscopy < 60 minutes ? ?Surgeon: Ronda Fairly. Octavie Westerhold, M.D. ? ?Anesthesia: General ? ?Complications: None ? ?Intraoperative findings: ?Cystoscopy-urethra normal in caliber without stricture.  Prostate with moderate lateral lobe enlargement, moderate bladder neck elevation and a small median lobe.  Bladder moderately trabeculated with cellules.  No mucosal erythema, solid or papillary lesions UOs normal-appearing bilaterally. ?Ureteropyeloscopy-calculus not present in the proximal ureter.  Slightly narrowed area proximal ureter.  No mucosal abnormalities.  Calculus present in the lower pole calyx 2 mm calcification adherent to a lower calyceal papilla ?Left retrograde pyelogram-previously seen ureteral calculus not identified.  He ureter normal in appearance moderate left hydronephrosis ? ?EBL: Minimal ? ?Specimens: ?None ? ? ?Indication: [name] [name] is a 71 y.o. male with a 6 mm left proximal ureteral calculus and renal colic.  Stone density ~ 1400 HU and he elected ureteroscopic removal.  After reviewing the management options for treatment, the patient elected to proceed with the above surgical procedure(s). We have discussed the potential benefits and risks of the procedure, side effects of the proposed treatment, the likelihood of the patient achieving the goals of the procedure, and any potential problems that might occur during the procedure or recuperation. Informed consent has been obtained. ? ?Description of procedure: ? ?The patient was taken to the operating room and general anesthesia was induced.  The patient was placed in the dorsal lithotomy position, prepped and draped in  the usual sterile fashion, and preoperative antibiotics were administered. A preoperative time-out was performed.  ? ?A 21 French cystoscope was lubricated, passed per urethra and advanced proximally under direct vision with findings as described above. ? ?Attention then turned to the left ureteral orifice and a ureteral catheter was used to intubate the ureteral orifice.  On fluoroscopy the previously seen calculus was not identified in the region of the proximal ureter.  Omnipaque contrast was injected through the ureteral catheter and a retrograde pyelogram was performed with findings as dictated above.  A 0.038 Sensor wire was then advanced through the ureteral catheter and into the renal pelvis and fluoroscopic guidance. ? ?A 4.5 French semirigid ureteroscope was lubricated was then advanced into the bladder.  The left UO was easily engaged with the ureteroscope and advanced to the proximal ureter and no calculus was identified. ? ?A second guidewire was placed to the ureteroscope which was then removed.   ? ?A single channel digital flexible ureteroscope was then advanced over the working wire into the ureter without difficulty.  Once in the ureter the guidewire was removed and the ureteroscope was advanced proximally.  The scope would not advance through the slightly narrowed area in the proximal ureter.  The guidewire was placed in the ureteroscope was easily advanced through this narrowed area.  The working wire was again removed and the scope was advanced into the renal pelvis where pyeloscopy was performed with findings as described above. ? ?A 242 ?m holmium laser fiber was then placed through the ureteroscope and the calculus was dusted at a setting of 0.2J/50 Hz.  The stone was hard and several fragments chipped off of the calculus which were further fragmented with noncontact laser lithotripsy.  The small calyceal papillary calculus was also fragmented 2 fragments estimated at 2-3 mm in size were  placed in a 1.9 French nitinol basket and removed without difficulty.  The ureteroscope was readvanced.  Contrast was instilled through the ureteroscope and all calyces were examined.  No additional fragments were notified with exception of minute particles. ? ?All stone fragments were then removed from the collecting system with a zero tip nitinol basket.  Retrograde pyelogram was performed and each calyx was sequentially examined under fluoroscopic guidance and no significant size fragments were identified. ? ?A 48F/26 cm Contour ureteral stent was placed under fluoroscopic guidance.  The wire was then removed with an adequate stent curl noted in the renal pelvis as well as in the bladder. ? ?The bladder was then emptied and the procedure ended.  The patient appeared to tolerate the procedure well and without complications.  After anesthetic reversal the patient was transported to the PACU in stable condition.  ? ?Plan: ?He will be scheduled for cystoscopy with stent removal in the office in approximately 7 days ? ? ?John Giovanni, MD ? ?

## 2021-05-16 NOTE — Anesthesia Preprocedure Evaluation (Signed)
Anesthesia Evaluation  ?Patient identified by MRN, date of birth, ID band ?Patient awake ? ? ? ?Reviewed: ?Allergy & Precautions, NPO status , Patient's Chart, lab work & pertinent test results ? ?History of Anesthesia Complications ?Negative for: history of anesthetic complications ? ?Airway ?Mallampati: III ? ?TM Distance: >3 FB ?Neck ROM: full ? ? ? Dental ? ?(+) Chipped ?  ?Pulmonary ?neg shortness of breath, former smoker,  ?  ?Pulmonary exam normal ? ? ? ? ? ? ? Cardiovascular ?Exercise Tolerance: Good ?hypertension, (-) angina(-) Past MI and (-) DOE + dysrhythmias Atrial Fibrillation  ? ? ?  ?Neuro/Psych ? Headaches, PSYCHIATRIC DISORDERS   ? GI/Hepatic ?Neg liver ROS, GERD  Controlled,  ?Endo/Other  ?diabetes, Type 2 ? Renal/GU ?Renal disease  ? ?  ?Musculoskeletal ? ?(+) Arthritis ,  ? Abdominal ?  ?Peds ? Hematology ?negative hematology ROS ?(+)   ?Anesthesia Other Findings ?Past Medical History: ?No date: Allergy ?No date: Anxiety ?No date: Arthritis ?No date: Atrial fibrillation ([place]) ?    Comment:  sees Dr. Lovena Le ?No date: Cataract ?    Comment:  forming ?No date: Chronic kidney disease ?    Comment:  kidney stone ?No date: Depression ?No date: Dizziness ?    Comment:  or vertigo ?No date: Dysrhythmia ?    Comment:  atrial fibrillation, once or twice a year ?No date: GERD (gastroesophageal reflux disease) ?No date: Headache ?    Comment:  eyglass flicker in eye is migraine symptoms ?No date: History of kidney stones ?No date: HLD (hyperlipidemia) ?No date: HTN (hypertension) ?No date: Osteopenia ?No date: Retinal tear ?No date: Sinus bradycardia ? ?Past Surgical History: ?2003: CARDIAC CATHETERIZATION ?    Comment:  normal ?01/04/2018: COLONOSCOPY ?    Comment:  per Dr. Fuller Plan, adenomatous  polyp, repeat in 5 years ?01/21/2018: EXTRACORPOREAL SHOCK WAVE LITHOTRIPSY; Left ?    Comment:  Procedure: EXTRACORPOREAL SHOCK WAVE LITHOTRIPSY (ESWL); ?             Surgeon:  Hollice Espy, MD;  Location: [facility] ORS;   ?             Service: Urology;  Laterality: Left; ?No date: EYE SURGERY ?    Comment:  retinal ?No date: POLYPECTOMY ?No date: RETINAL LASER PROCEDURE; Right ?    Comment:  repair retinal tear  ?No date: WART REMOVAL ?    Comment:  LEFT UPPER ARM ?No date: WISDOM TOOTH EXTRACTION ? ?BMI   ? Body Mass Index: 29.69 kg/m?  ?  ? ? Reproductive/Obstetrics ?negative OB ROS ? ?  ? ? ? ? ? ? ? ? ? ? ? ? ? ?  ?  ? ? ? ? ? ? ? ? ?Anesthesia Physical ?Anesthesia Plan ? ?ASA: 3 ? ?Anesthesia Plan: General ETT  ? ?Post-op Pain Management:   ? ?Induction: Intravenous ? ?PONV Risk Score and Plan: Ondansetron, Dexamethasone, Midazolam and Treatment may vary due to age or medical condition ? ?Airway Management Planned: Oral ETT ? ?Additional Equipment:  ? ?Intra-op Plan:  ? ?Post-operative Plan: Extubation in OR ? ?Informed Consent: I have reviewed the patients History and Physical, chart, labs and discussed the procedure including the risks, benefits and alternatives for the proposed anesthesia with the patient or authorized representative who has indicated his/her understanding and acceptance.  ? ? ? ?Dental Advisory Given ? ?Plan Discussed with: Anesthesiologist, CRNA and Surgeon ? ?Anesthesia Plan Comments: (Patient consented for risks of anesthesia including but not limited to:  ?-  adverse reactions to medications ?- damage to eyes, teeth, lips or other oral mucosa ?- nerve damage due to positioning  ?- sore throat or hoarseness ?- Damage to heart, brain, nerves, lungs, other parts of body or loss of life ? ?Patient voiced understanding.)  ? ? ? ? ? ? ?Anesthesia Quick Evaluation ? ?

## 2021-05-16 NOTE — Anesthesia Postprocedure Evaluation (Signed)
Anesthesia Post Note ? ?Patient: [name] [name] ? ?Procedure(s) Performed: CYSTOSCOPY/URETEROSCOPY/HOLMIUM LASER/STENT PLACEMENT (Left) ? ?Patient location during evaluation: PACU ?Anesthesia Type: General ?Level of consciousness: awake and alert ?Pain management: pain level controlled ?Vital Signs Assessment: post-procedure vital signs reviewed and stable ?Respiratory status: spontaneous breathing, nonlabored ventilation, respiratory function stable and patient connected to nasal cannula oxygen ?Cardiovascular status: blood pressure returned to baseline and stable ?Postop Assessment: no apparent nausea or vomiting ?Anesthetic complications: no ? ? ?No notable events documented. ? ? ?Last Vitals:  ?Vitals:  ? 05/16/21 1400 05/16/21 1415  ?BP: (!) 164/81 (!) 161/80  ?Pulse: (!) 53 (!) 56  ?Resp: 16 14  ?Temp:  (!) 36.4 ?C  ?SpO2: 96% 96%  ?  ?Last Pain:  ?Vitals:  ? 05/16/21 1415  ?TempSrc:   ?PainSc: 0-No pain  ? ? ?  ?  ?  ?  ?  ?  ? ?Precious Haws Eurika Sandy ? ? ? ? ?

## 2021-05-17 ENCOUNTER — Encounter: Payer: Self-pay | Admitting: Urology

## 2021-05-22 ENCOUNTER — Ambulatory Visit (INDEPENDENT_AMBULATORY_CARE_PROVIDER_SITE_OTHER): Payer: Medicare HMO

## 2021-05-22 ENCOUNTER — Ambulatory Visit: Payer: Medicare HMO

## 2021-05-22 VITALS — Ht 73.0 in | Wt 225.0 lb

## 2021-05-22 DIAGNOSIS — Z0000 Encounter for general adult medical examination without abnormal findings: Secondary | ICD-10-CM | POA: Diagnosis not present

## 2021-05-22 NOTE — Progress Notes (Signed)
? ?Subjective:  ? [name] [name] is a 71 y.o. male who presents for Medicare Annual/Subsequent preventive examination. ? ?Review of Systems    ?Virtual Visit via Telephone Note ? ?I connected with  [name] [name] on 05/22/21 at  9:00 AM EDT by telephone and verified that I am speaking with the correct person using two identifiers. ? ?Location: ?Patient: Home ?Provider: Office ?Persons participating in the virtual visit: patient/Nurse Health Advisor ?  ?I discussed the limitations, risks, security and privacy concerns of performing an evaluation and management service by telephone and the availability of in person appointments. The patient expressed understanding and agreed to proceed. ? ?Interactive audio and video telecommunications were attempted between this nurse and patient, however failed, due to patient having technical difficulties OR patient did not have access to video capability.  We continued and completed visit with audio only. ? ?Some vital signs may be absent or patient reported.  ? ?[name] Peaches, LPN  ?Cardiac Risk Factors include: advanced age (>16mn, >>47women);diabetes mellitus;hypertension;male gender ? ?   ?Objective:  ?  ?Today's Vitals  ? 05/22/21 0905  ?Weight: 225 lb (102.1 kg)  ?Height: _0  (1.854 m)  ? ?Body mass index is 29.69 kg/m?. ? ?Advanced Directives 05/22/2021 05/16/2021 05/14/2021 05/16/2020 01/29/2018 01/27/2018 11/24/2017  ?Does Patient Have a Medical Advance Directive? _1  No No  ?Would patient like information on creating a medical advance directive? No - Patient declined No - Patient declined No - Patient declined No - Patient declined No - Patient declined - No - Patient declined  ? ? ?Current Medications (verified) ?Outpatient Encounter Medications as of 05/22/2021  ?Medication Sig  ? aspirin EC 325 MG tablet Take 1 tablet (325 mg total) by mouth daily.  ? atenolol (TENORMIN) 25 MG tablet Take 1 tablet (25 mg total) by mouth 2 (two) times daily.  ? atorvastatin  (LIPITOR) 40 MG tablet Take 1 tablet (40 mg total) by mouth daily.  ? CALCIUM-MAGNESIUM-ZINC PO Take 1 tablet by mouth in the morning.  ? Cholecalciferol (VITAMIN D3 PO) Take 2,000 Units by mouth in the morning.  ? FLAREX 0.1 % ophthalmic suspension Place 1 drop into the right eye daily.  ? flecainide (TAMBOCOR) 100 MG tablet MAY TAKE TWO TABLETS BY MOUTH DAILY AS NEEDED FOR BREAKTHROUGH A-FIB  ? Fluocinolone Acetonide (DERMOTIC) 0.01 % OIL Place 1-2 drops in ear(s) 3 (three) times a week. 1-2 gtts to aa ears for psoriasis up to 3 days a week prn flares (Patient taking differently: Place 1-2 drops in ear(s) daily as needed (psoriasis flares.). 1-2 gtts to aa ears for psoriasis up to 3 days a week prn flares)  ? fluticasone (FLONASE) 50 MCG/ACT nasal spray Place 2 sprays into both nostrils in the morning.  ? HYDROcodone-acetaminophen (NORCO/VICODIN) 5-325 MG tablet Take 1 tablet by mouth every 4 (four) hours as needed for moderate pain.  ? lisinopril (ZESTRIL) 5 MG tablet Take 1 tablet by mouth once daily  ? meloxicam (MOBIC) 15 MG tablet Take 1 tablet (15 mg total) by mouth daily.  ? methylPREDNISolone (MEDROL DOSEPAK) 4 MG TBPK tablet 6 day dose pack - take as directed (Patient not taking: Reported on 05/15/2021)  ? mometasone (ELOCON) 0.1 % lotion For psoriasis of the scalp apply to aa's QHS and wash out in the morning. Use up to 5d/wk PRN. Avoid f/g/a. (Patient taking differently: Apply 1 application. topically daily as needed (psoriasis of scalp). For psoriasis of the scalp apply  to aa's QHS and wash out in the morning. Use up to 5d/wk PRN. Avoid f/g/a.)  ? ofloxacin (OCUFLOX) 0.3 % ophthalmic solution Place 1 drop into the right eye 4 (four) times daily.  ? oxybutynin (DITROPAN) 5 MG tablet 1 tab tid prn frequency,urgency, bladder spasm  ? Polyethylene Glycol 400 (BLINK TEARS) 0.25 % SOLN Apply to eye.  ? prednisoLONE acetate (PRED FORTE) 1 % ophthalmic suspension Place 1 drop into the right eye 4 (four) times  daily.  ? PROLENSA 0.07 % SOLN Place 1 drop into the left eye daily.  ? sildenafil (REVATIO) 20 MG tablet TAKE 5 TABLETS BY MOUTH AS NEEDED  ? venlafaxine XR (EFFEXOR-XR) 37.5 MG 24 hr capsule TAKE 1 CAPSULE BY MOUTH ONCE DAILY WITH BREAKFAST  ? ?No facility-administered encounter medications on file as of 05/22/2021.  ? ? ?Allergies (verified) ?Oxycodone, Paroxetine, and Sulfa antibiotics  ? ?History: ?Past Medical History:  ?Diagnosis Date  ? Allergy   ? Anxiety   ? Arthritis   ? Atrial fibrillation ([place])   ? sees Dr. Lovena Le  ? Cataract   ? forming  ? Chronic kidney disease   ? kidney stone  ? Depression   ? Dizziness   ? or vertigo  ? Dysrhythmia   ? atrial fibrillation, once or twice a year  ? GERD (gastroesophageal reflux disease)   ? Headache   ? eyglass flicker in eye is migraine symptoms  ? History of kidney stones   ? HLD (hyperlipidemia)   ? HTN (hypertension)   ? Osteopenia   ? Retinal tear   ? Sinus bradycardia   ? ?Past Surgical History:  ?Procedure Laterality Date  ? CARDIAC CATHETERIZATION  2003  ? normal  ? COLONOSCOPY  01/04/2018  ? per Dr. Fuller Plan, adenomatous  polyp, repeat in 5 years  ? CYSTOSCOPY/URETEROSCOPY/HOLMIUM LASER/STENT PLACEMENT Left 05/16/2021  ? Procedure: CYSTOSCOPY/URETEROSCOPY/HOLMIUM LASER/STENT PLACEMENT;  Surgeon: Abbie Sons, MD;  Location: [facility] ORS;  Service: Urology;  Laterality: Left;  ? EXTRACORPOREAL SHOCK WAVE LITHOTRIPSY Left 01/21/2018  ? Procedure: EXTRACORPOREAL SHOCK WAVE LITHOTRIPSY (ESWL);  Surgeon: Hollice Espy, MD;  Location: [facility] ORS;  Service: Urology;  Laterality: Left;  ? EYE SURGERY    ? retinal  ? POLYPECTOMY    ? RETINAL LASER PROCEDURE Right   ? repair retinal tear   ? WART REMOVAL    ? LEFT UPPER ARM  ? WISDOM TOOTH EXTRACTION    ? ?Family History  ?Problem Relation Age of Onset  ? Hodgkin's lymphoma Father   ? Hyperlipidemia Other   ? Hypertension Mother   ? Stroke Mother   ? Heart attack Brother   ? Prostate cancer Brother   ?     1st degree  relative <50  ? Diabetes Brother   ?     1st degree relative  ? Multiple myeloma Brother   ? Hypertension Brother   ? Heart disease Brother   ? Heart attack Brother   ? Colon polyps Brother   ? Colon cancer Neg Hx   ? Esophageal cancer Neg Hx   ? Rectal cancer Neg Hx   ? Stomach cancer Neg Hx   ? ?Social History  ? ?Socioeconomic History  ? Marital status: Married  ?  Spouse name: nancy  ? Number of children: Not on file  ? Years of education: Not on file  ? Highest education level: Not on file  ?Occupational History  ? Occupation: Multimedia programmer for [employer]  ?  Comment: retired  ?  Tobacco Use  ? Smoking status: Former  ?  Types: Cigarettes  ?  Quit date: 03/11/1975  ?  Years since quitting: 46.2  ? Smokeless tobacco: Never  ?Vaping Use  ? Vaping Use: Never used  ?Substance and Sexual Activity  ? Alcohol use: Yes  ?  Alcohol/week: 0.0 standard drinks  ?  Comment: couple times a month-SOCIALLY 1 BEER A MONTH  ? Drug use: No  ? Sexual activity: Yes  ?  Birth control/protection: None  ?Other Topics Concern  ? Not on file  ?Social History Narrative  ? Occupation: Optometrist.   ? ?Social Determinants of Health  ? ?Financial Resource Strain: Low Risk   ? Difficulty of Paying Living Expenses: Not hard at all  ?Food Insecurity: No Food Insecurity  ? Worried About Charity fundraiser in the Last Year: Never true  ? Ran Out of Food in the Last Year: Never true  ?Transportation Needs: No Transportation Needs  ? Lack of Transportation (Medical): No  ? Lack of Transportation (Non-Medical): No  ?Physical Activity: Sufficiently Active  ? Days of Exercise per Week: 5 days  ? Minutes of Exercise per Session: 30 min  ?Stress: No Stress Concern Present  ? Feeling of Stress : Not at all  ?Social Connections: Socially Integrated  ? Frequency of Communication with Friends and Family: More than three times a week  ? Frequency of Social Gatherings with Friends and Family: More than three times a week  ? Attends Religious Services: More than 4 times  per year  ? Active Member of Clubs or Organizations: Yes  ? Attends Archivist Meetings: More than 4 times per year  ? Marital Status: Married  ? ? ?Clinical Intake: ? ?Pre-visit preparation comple

## 2021-05-22 NOTE — Patient Instructions (Addendum)
?[name] [name] , ?Thank you for taking time to come for your Medicare Wellness Visit. I appreciate your ongoing commitment to your health goals. Please review the following plan we discussed and let me know if I can assist you in the future.  ? ?These are the goals we discussed: ? Goals   ? ?   Patient Stated (pt-stated)   ?   Stay healthy and active and manage diet and weight losing 10 more lbs. ?  ? ?  ?  ?This is a list of the screening recommended for you and due dates:  ?Health Maintenance  ?Topic Date Due  ? Eye exam for diabetics  03/12/2021  ? Hemoglobin A1C  03/27/2021  ? Complete foot exam   05/16/2021  ? Flu Shot  06/07/2021*  ? COVID-19 Vaccine (1) 06/07/2021*  ? Zoster (Shingles) Vaccine (1 of 2) 08/22/2021*  ? Pneumonia Vaccine (1 - PCV) 05/23/2022*  ? Tetanus Vaccine  05/23/2022*  ? Hepatitis C Screening: USPSTF Recommendation to screen - Ages 18-79 yo.  05/23/2022*  ? Colon Cancer Screening  01/05/2023  ? HPV Vaccine  Aged Out  ?*Topic was postponed. The date shown is not the original due date.  ?  ?Opioid Pain Medicine Management ?Opioids are powerful medicines that are used to treat moderate to severe pain. When used for short periods of time, they can help you to: ?Sleep better. ?Do better in physical or occupational therapy. ?Feel better in the first few days after an injury. ?Recover from surgery. ?Opioids should be taken with the supervision of a trained health care provider. They should be taken for the shortest period of time possible. This is because opioids can be addictive, and the longer you take opioids, the greater your risk of addiction. This addiction can also be called opioid use disorder. ?What are the risks? ?Using opioid pain medicines for longer than 3 days increases your risk of side effects. Side effects include: ?Constipation. ?Nausea and vomiting. ?Breathing difficulties (respiratory depression). ?Drowsiness. ?Confusion. ?Opioid use disorder. ?Itching. ?Taking opioid pain  medicine for a long period of time can affect your ability to do daily tasks. It also puts you at risk for: ?Motor vehicle crashes. ?Depression. ?Suicide. ?Heart attack. ?Overdose, which can be life-threatening. ?What is a pain treatment plan? ?A pain treatment plan is an agreement between you and your health care provider. Pain is unique to each person, and treatments vary depending on your condition. To manage your pain, you and your health care provider need to work together. To help you do this: ?Discuss the goals of your treatment, including how much pain you might expect to have and how you will manage the pain. ?Review the risks and benefits of taking opioid medicines. ?Remember that a good treatment plan uses more than one approach and minimizes the chance of side effects. ?Be honest about the amount of medicines you take and about any drug or alcohol use. ?Get pain medicine prescriptions from only one health care provider. ?Pain can be managed with many types of alternative treatments. Ask your health care provider to refer you to one or more specialists who can help you manage pain through: ?Physical or occupational therapy. ?Counseling (cognitive behavioral therapy). ?Good nutrition. ?Biofeedback. ?Massage. ?Meditation. ?Non-opioid medicine. ?Following a gentle exercise program. ?How to use opioid pain medicine ?Taking medicine ?Take your pain medicine exactly as told by your health care provider. Take it only when you need it. ?If your pain gets less severe, you may take less  than your prescribed dose if your health care provider approves. ?If you are not having pain, do nottake pain medicine unless your health care provider tells you to take it. ?If your pain is severe, do nottry to treat it yourself by taking more pills than instructed on your prescription. Contact your health care provider for help. ?Write down the times when you take your pain medicine. It is easy to become confused while on pain  medicine. Writing the time can help you avoid overdose. ?Take other over-the-counter or prescription medicines only as told by your health care provider. ?Keeping yourself and others safe ? ?While you are taking opioid pain medicine: ?Do not drive, use machinery, or power tools. ?Do not sign legal documents. ?Do not drink alcohol. ?Do not take sleeping pills. ?Do not supervise children by yourself. ?Do not do activities that require climbing or being in high places. ?Do not go to a lake, river, ocean, spa, or swimming pool. ?Do not share your pain medicine with anyone. ?Keep pain medicine in a locked cabinet or in a secure area where pets and children cannot reach it. ?Stopping your use of opioids ?If you have been taking opioid medicine for more than a few weeks, you may need to slowly decrease (taper) how much you take until you stop completely. Tapering your use of opioids can decrease your risk of symptoms of withdrawal, such as: ?Pain and cramping in the abdomen. ?Nausea. ?Sweating. ?Sleepiness. ?Restlessness. ?Uncontrollable shaking (tremors). ?Cravings for the medicine. ?Do not attempt to taper your use of opioids on your own. Talk with your health care provider about how to do this. Your health care provider may prescribe a step-down schedule based on how much medicine you are taking and how long you have been taking it. ?Getting rid of leftover pills ?Do not save any leftover pills. Get rid of leftover pills safely by: ?Taking the medicine to a prescription take-back program. This is usually offered by the county or law enforcement. ?Bringing them to a pharmacy that has a drug disposal container. ?Flushing them down the toilet. Check the label or package insert of your medicine to see whether this is safe to do. ?Throwing them out in the trash. Check the label or package insert of your medicine to see whether this is safe to do. ?If it is safe to throw it out, remove the medicine from the original  container, put it into a sealable bag or container, and mix it with used coffee grounds, food scraps, dirt, or cat litter before putting it in the trash. ?Follow these instructions at home: ?Activity ?Do exercises as told by your health care provider. ?Avoid activities that make your pain worse. ?Return to your normal activities as told by your health care provider. Ask your health care provider what activities are safe for you. ?General instructions ?You may need to take these actions to prevent or treat constipation: ?Drink enough fluid to keep your urine pale yellow. ?Take over-the-counter or prescription medicines. ?Eat foods that are high in fiber, such as beans, whole grains, and fresh fruits and vegetables. ?Limit foods that are high in fat and processed sugars, such as fried or sweet foods. ?Keep all follow-up visits. This is important. ?Where to find support ?If you have been taking opioids for a long time, you may benefit from receiving support for quitting from a local support group or counselor. Ask your health care provider for a referral to these resources in your area. ?Where to find more  information ?Centers for Disease Control and Prevention (CDC): http://www.wolf.info/ ?[place] Food and Drug Administration (FDA): [place].ch ?Get help right away if: ?You may have taken too much of an opioid (overdosed). Common symptoms of an overdose: ?Your breathing is slower or more shallow than normal. ?You have a very slow heartbeat (pulse). ?You have slurred speech. ?You have nausea and vomiting. ?Your pupils become very small. ?You have other potential symptoms: ?You are very confused. ?You faint or feel like you will faint. ?You have cold, clammy skin. ?You have blue lips or fingernails. ?You have thoughts of harming yourself or harming others. ?These symptoms may represent a serious problem that is an emergency. Do not wait to see if the symptoms will go away. Get medical help right away. Call your local emergency  services (911 in the [place] Do not drive yourself to the hospital.  ?If you ever feel like you may hurt yourself or others, or have thoughts about taking your own life, get help right away. Go to your nearest emergency de

## 2021-05-23 ENCOUNTER — Encounter: Payer: Medicare HMO | Admitting: Urology

## 2021-05-23 DIAGNOSIS — H35371 Puckering of macula, right eye: Secondary | ICD-10-CM | POA: Diagnosis not present

## 2021-05-23 DIAGNOSIS — H3581 Retinal edema: Secondary | ICD-10-CM | POA: Diagnosis not present

## 2021-05-24 ENCOUNTER — Other Ambulatory Visit: Payer: Self-pay

## 2021-05-24 ENCOUNTER — Encounter: Payer: Self-pay | Admitting: Urology

## 2021-05-24 ENCOUNTER — Ambulatory Visit (INDEPENDENT_AMBULATORY_CARE_PROVIDER_SITE_OTHER): Payer: Medicare HMO | Admitting: Urology

## 2021-05-24 VITALS — BP 145/83 | HR 61 | Ht 73.0 in | Wt 225.0 lb

## 2021-05-24 DIAGNOSIS — N200 Calculus of kidney: Secondary | ICD-10-CM

## 2021-05-24 DIAGNOSIS — N201 Calculus of ureter: Secondary | ICD-10-CM | POA: Diagnosis not present

## 2021-05-24 DIAGNOSIS — H31091 Other chorioretinal scars, right eye: Secondary | ICD-10-CM | POA: Diagnosis not present

## 2021-05-24 DIAGNOSIS — H35371 Puckering of macula, right eye: Secondary | ICD-10-CM | POA: Diagnosis not present

## 2021-05-24 DIAGNOSIS — Z9889 Other specified postprocedural states: Secondary | ICD-10-CM

## 2021-05-24 LAB — MICROSCOPIC EXAMINATION: RBC, Urine: >30 /hpf — AB (ref 0–2)

## 2021-05-24 LAB — URINALYSIS, COMPLETE
Bilirubin, UA: NEGATIVE
Glucose, UA: NEGATIVE
Nitrite, UA: NEGATIVE
Specific Gravity, UA: >1.03 — ABNORMAL HIGH (ref 1.005–1.030)
Urobilinogen, Ur: 1 mg/dL (ref 0.2–1.0)
pH, UA: 6 (ref 5.0–7.5)

## 2021-05-24 NOTE — Progress Notes (Signed)
Indications: Patient is 71 y.o., who is s/p ureteroscopic removal of a left renal calculus 05/16/2021.  He had no postoperative problems the patient is presenting today for stent removal. ? ?Procedure:  Flexible Cystoscopy with stent removal (01749) ? ?Timeout was performed and the correct patient, procedure and participants were identified.   ? ?Description:  The patient was prepped and draped in the usual sterile fashion. Flexible cystosopy was performed.  The stent was visualized, grasped, and removed intact without difficulty. The patient tolerated the procedure well.  A single dose of oral antibiotics was given. ? ?Complications:  None ? ?Plan:  ?Instructed to call for fever/flank pain post stent removal ?He is a recurrent stone former and metabolic evaluation was discussed which he desires to pursue ?24-hour urine collection/blood work through [facility].  He will be notified with the results ?Follow-up 6 months with [facility] ? ? ?

## 2021-05-24 NOTE — Patient Instructions (Signed)

## 2021-05-27 ENCOUNTER — Encounter: Payer: Self-pay | Admitting: *Deleted

## 2021-05-27 ENCOUNTER — Other Ambulatory Visit: Payer: Self-pay | Admitting: *Deleted

## 2021-05-27 DIAGNOSIS — N200 Calculus of kidney: Secondary | ICD-10-CM

## 2021-05-31 ENCOUNTER — Ambulatory Visit: Payer: Medicare HMO

## 2021-05-31 ENCOUNTER — Other Ambulatory Visit: Payer: Self-pay

## 2021-05-31 DIAGNOSIS — M2041 Other hammer toe(s) (acquired), right foot: Secondary | ICD-10-CM

## 2021-05-31 DIAGNOSIS — M7741 Metatarsalgia, right foot: Secondary | ICD-10-CM

## 2021-05-31 DIAGNOSIS — M722 Plantar fascial fibromatosis: Secondary | ICD-10-CM

## 2021-05-31 DIAGNOSIS — M2042 Other hammer toe(s) (acquired), left foot: Secondary | ICD-10-CM

## 2021-05-31 NOTE — Progress Notes (Signed)
SITUATION: ?Reason for Visit: Fitting and Delivery of [place] ?Patient Report: Patient reports comfort and is satisfied with device. ? ?OBJECTIVE DATA: ?Patient History / Diagnosis:   ?  ICD-10-CM   ?1. Metatarsalgia of both feet  M77.41   ? M77.42   ?  ?2. Hammertoe of left foot  M20.42   ?  ?3. Hammertoe of right foot  M20.41   ?  ?4. Plantar fasciitis, right  M72.2   ?  ? ? ?Provided Device:  Custom Functional Foot Orthotics ?    RicheyLAB: [id number] ? ?GOAL OF ORTHOSIS ?- Improve gait ?- Decrease energy expenditure ?- Improve Balance ?- Provide Triplanar stability of foot complex ?- Facilitate motion ? ?ACTIONS PERFORMED ?Patient was fit with foot orthotics trimmed to shoe last. Patient tolerated fittign procedure.  ? ?Patient was provided with verbal and written instruction and demonstration regarding donning, doffing, wear, care, proper fit, function, purpose, cleaning, and use of the orthosis and in all related precautions and risks and benefits regarding the orthosis. ? ?Patient was also provided with verbal instruction regarding how to report any failures or malfunctions of the orthosis and necessary follow up care. Patient was also instructed to contact our office regarding any change in status that may affect the function of the orthosis. ? ?Patient demonstrated independence with proper donning, doffing, and fit and verbalized understanding of all instructions. ? ?PLAN: ?Patient is to follow up in one week or as necessary (PRN). All questions were answered and concerns addressed. Plan of care was discussed with and agreed upon by the patient. ? ?

## 2021-06-04 DIAGNOSIS — H35371 Puckering of macula, right eye: Secondary | ICD-10-CM | POA: Diagnosis not present

## 2021-06-06 ENCOUNTER — Other Ambulatory Visit: Payer: Self-pay | Admitting: Family Medicine

## 2021-06-12 DIAGNOSIS — J0100 Acute maxillary sinusitis, unspecified: Secondary | ICD-10-CM | POA: Diagnosis not present

## 2021-06-28 DIAGNOSIS — H3581 Retinal edema: Secondary | ICD-10-CM | POA: Diagnosis not present

## 2021-06-28 DIAGNOSIS — H43391 Other vitreous opacities, right eye: Secondary | ICD-10-CM | POA: Diagnosis not present

## 2021-06-28 DIAGNOSIS — H43812 Vitreous degeneration, left eye: Secondary | ICD-10-CM | POA: Diagnosis not present

## 2021-07-01 ENCOUNTER — Other Ambulatory Visit: Payer: Self-pay | Admitting: Family Medicine

## 2021-07-23 DIAGNOSIS — H35412 Lattice degeneration of retina, left eye: Secondary | ICD-10-CM | POA: Diagnosis not present

## 2021-08-29 DIAGNOSIS — H33301 Unspecified retinal break, right eye: Secondary | ICD-10-CM | POA: Diagnosis not present

## 2021-08-29 DIAGNOSIS — H35371 Puckering of macula, right eye: Secondary | ICD-10-CM | POA: Diagnosis not present

## 2021-08-29 DIAGNOSIS — H43811 Vitreous degeneration, right eye: Secondary | ICD-10-CM | POA: Diagnosis not present

## 2021-08-29 DIAGNOSIS — Z0100 Encounter for examination of eyes and vision without abnormal findings: Secondary | ICD-10-CM | POA: Diagnosis not present

## 2021-08-29 DIAGNOSIS — H04123 Dry eye syndrome of bilateral lacrimal glands: Secondary | ICD-10-CM | POA: Diagnosis not present

## 2021-08-29 DIAGNOSIS — H35411 Lattice degeneration of retina, right eye: Secondary | ICD-10-CM | POA: Diagnosis not present

## 2021-09-02 ENCOUNTER — Other Ambulatory Visit: Payer: Self-pay | Admitting: Family Medicine

## 2021-09-13 ENCOUNTER — Other Ambulatory Visit: Payer: Self-pay | Admitting: Family Medicine

## 2021-09-26 ENCOUNTER — Ambulatory Visit: Payer: Medicare HMO | Admitting: Dermatology

## 2021-09-26 ENCOUNTER — Ambulatory Visit (INDEPENDENT_AMBULATORY_CARE_PROVIDER_SITE_OTHER): Payer: Medicare HMO | Admitting: Family Medicine

## 2021-09-26 ENCOUNTER — Encounter: Payer: Self-pay | Admitting: Family Medicine

## 2021-09-26 VITALS — BP 126/78 | HR 56 | Temp 97.5°F | Ht 71.0 in | Wt 229.0 lb

## 2021-09-26 DIAGNOSIS — L409 Psoriasis, unspecified: Secondary | ICD-10-CM

## 2021-09-26 DIAGNOSIS — D1800 Hemangioma unspecified site: Secondary | ICD-10-CM

## 2021-09-26 DIAGNOSIS — R69 Illness, unspecified: Secondary | ICD-10-CM | POA: Diagnosis not present

## 2021-09-26 DIAGNOSIS — L814 Other melanin hyperpigmentation: Secondary | ICD-10-CM | POA: Diagnosis not present

## 2021-09-26 DIAGNOSIS — Z1283 Encounter for screening for malignant neoplasm of skin: Secondary | ICD-10-CM | POA: Diagnosis not present

## 2021-09-26 DIAGNOSIS — I10 Essential (primary) hypertension: Secondary | ICD-10-CM

## 2021-09-26 DIAGNOSIS — I781 Nevus, non-neoplastic: Secondary | ICD-10-CM

## 2021-09-26 DIAGNOSIS — E538 Deficiency of other specified B group vitamins: Secondary | ICD-10-CM | POA: Diagnosis not present

## 2021-09-26 DIAGNOSIS — I480 Paroxysmal atrial fibrillation: Secondary | ICD-10-CM | POA: Diagnosis not present

## 2021-09-26 DIAGNOSIS — L570 Actinic keratosis: Secondary | ICD-10-CM | POA: Diagnosis not present

## 2021-09-26 DIAGNOSIS — G4730 Sleep apnea, unspecified: Secondary | ICD-10-CM | POA: Diagnosis not present

## 2021-09-26 DIAGNOSIS — L820 Inflamed seborrheic keratosis: Secondary | ICD-10-CM | POA: Diagnosis not present

## 2021-09-26 DIAGNOSIS — D229 Melanocytic nevi, unspecified: Secondary | ICD-10-CM

## 2021-09-26 DIAGNOSIS — E782 Mixed hyperlipidemia: Secondary | ICD-10-CM

## 2021-09-26 DIAGNOSIS — L578 Other skin changes due to chronic exposure to nonionizing radiation: Secondary | ICD-10-CM | POA: Diagnosis not present

## 2021-09-26 DIAGNOSIS — L821 Other seborrheic keratosis: Secondary | ICD-10-CM | POA: Diagnosis not present

## 2021-09-26 DIAGNOSIS — N401 Enlarged prostate with lower urinary tract symptoms: Secondary | ICD-10-CM

## 2021-09-26 DIAGNOSIS — D649 Anemia, unspecified: Secondary | ICD-10-CM | POA: Diagnosis not present

## 2021-09-26 DIAGNOSIS — I8392 Asymptomatic varicose veins of left lower extremity: Secondary | ICD-10-CM | POA: Diagnosis not present

## 2021-09-26 DIAGNOSIS — F411 Generalized anxiety disorder: Secondary | ICD-10-CM | POA: Diagnosis not present

## 2021-09-26 DIAGNOSIS — N138 Other obstructive and reflux uropathy: Secondary | ICD-10-CM

## 2021-09-26 DIAGNOSIS — E119 Type 2 diabetes mellitus without complications: Secondary | ICD-10-CM

## 2021-09-26 DIAGNOSIS — K219 Gastro-esophageal reflux disease without esophagitis: Secondary | ICD-10-CM | POA: Diagnosis not present

## 2021-09-26 LAB — CBC WITH DIFFERENTIAL/PLATELET
Basophils Absolute: 0.1 10*3/uL (ref 0.0–0.1)
Basophils Relative: 1 % (ref 0.0–3.0)
Eosinophils Absolute: 0.3 10*3/uL (ref 0.0–0.7)
Eosinophils Relative: 5 % (ref 0.0–5.0)
HCT: 36.2 % — ABNORMAL LOW (ref 39.0–52.0)
Hemoglobin: 11.4 g/dL — ABNORMAL LOW (ref 13.0–17.0)
Lymphocytes Relative: 21.6 % (ref 12.0–46.0)
Lymphs Abs: 1.5 10*3/uL (ref 0.7–4.0)
MCHC: 31.5 g/dL (ref 30.0–36.0)
MCV: 74.3 fl — ABNORMAL LOW (ref 78.0–100.0)
Monocytes Absolute: 0.7 10*3/uL (ref 0.1–1.0)
Monocytes Relative: 10.8 % (ref 3.0–12.0)
Neutro Abs: 4.1 10*3/uL (ref 1.4–7.7)
Neutrophils Relative %: 61.6 % (ref 43.0–77.0)
Platelets: 187 10*3/uL (ref 150.0–400.0)
RBC: 4.87 Mil/uL (ref 4.22–5.81)
RDW: 15.3 % (ref 11.5–15.5)
WBC: 6.7 10*3/uL (ref 4.0–10.5)

## 2021-09-26 LAB — LIPID PANEL
Cholesterol: 128 mg/dL (ref 0–200)
HDL: 48.2 mg/dL (ref >39.00)
LDL Cholesterol: 61 mg/dL (ref 0–99)
NonHDL: 79.56
Total CHOL/HDL Ratio: 3
Triglycerides: 91 mg/dL (ref 0.0–149.0)
VLDL: 18.2 mg/dL (ref 0.0–40.0)

## 2021-09-26 LAB — VITAMIN B12: Vitamin B-12: 466 pg/mL (ref 211–911)

## 2021-09-26 LAB — HEPATIC FUNCTION PANEL
ALT: 14 U/L (ref 0–53)
AST: 16 U/L (ref 0–37)
Albumin: 4 g/dL (ref 3.5–5.2)
Alkaline Phosphatase: 70 U/L (ref 39–117)
Bilirubin, Direct: 0.2 mg/dL (ref 0.0–0.3)
Total Bilirubin: 0.8 mg/dL (ref 0.2–1.2)
Total Protein: 6.3 g/dL (ref 6.0–8.3)

## 2021-09-26 LAB — BASIC METABOLIC PANEL
BUN: 20 mg/dL (ref 6–23)
CO2: 30 mEq/L (ref 19–32)
Calcium: 8.7 mg/dL (ref 8.4–10.5)
Chloride: 103 mEq/L (ref 96–112)
Creatinine, Ser: 1.02 mg/dL (ref 0.40–1.50)
GFR: 74.58 mL/min (ref >60.00)
Glucose, Bld: 111 mg/dL — ABNORMAL HIGH (ref 70–99)
Potassium: 4.5 mEq/L (ref 3.5–5.1)
Sodium: 139 mEq/L (ref 135–145)

## 2021-09-26 LAB — PSA: PSA: 2.71 ng/mL (ref 0.10–4.00)

## 2021-09-26 LAB — TSH: TSH: 1.96 u[IU]/mL (ref 0.35–5.50)

## 2021-09-26 LAB — HEMOGLOBIN A1C: Hgb A1c MFr Bld: 7.6 % — ABNORMAL HIGH (ref 4.6–6.5)

## 2021-09-26 MED ORDER — SILDENAFIL CITRATE 20 MG PO TABS: 100.0000 mg | ORAL_TABLET | ORAL | 3 refills | Status: DC | PRN

## 2021-09-26 MED ORDER — ATORVASTATIN CALCIUM 40 MG PO TABS
40.0000 mg | ORAL_TABLET | Freq: Every day | ORAL | 3 refills | Status: DC
Start: 2021-09-26 — End: 2022-07-01

## 2021-09-26 MED ORDER — LISINOPRIL 5 MG PO TABS: 5.0000 mg | ORAL_TABLET | Freq: Every day | ORAL | 3 refills | Status: DC

## 2021-09-26 MED ORDER — VENLAFAXINE HCL ER 37.5 MG PO CP24: 37.5000 mg | ORAL_CAPSULE | Freq: Every day | ORAL | 3 refills | Status: DC

## 2021-09-26 MED ORDER — MELOXICAM 15 MG PO TABS: 15.0000 mg | ORAL_TABLET | Freq: Every day | ORAL | 3 refills | Status: DC

## 2021-09-26 MED ORDER — FLUOCINOLONE ACETONIDE 0.01 % OT OIL: 1.0000 [drp] | TOPICAL_OIL | OTIC | 1 refills | Status: DC

## 2021-09-26 MED ORDER — ATENOLOL 25 MG PO TABS: 25.0000 mg | ORAL_TABLET | Freq: Three times a day (TID) | ORAL | 3 refills | Status: DC

## 2021-09-26 MED ORDER — MOMETASONE FUROATE 0.1 % EX SOLN: CUTANEOUS | 11 refills | Status: DC

## 2021-09-26 NOTE — Patient Instructions (Signed)
Cryotherapy Aftercare  Wash gently with soap and water everyday.   Apply Vaseline and Band-Aid daily until healed.     Due to recent changes in healthcare laws, you may see results of your pathology and/or laboratory studies on MyChart before the doctors have had a chance to review them. We understand that in some cases there may be results that are confusing or concerning to you. Please understand that not all results are received at the same time and often the doctors may need to interpret multiple results in order to provide you with the best plan of care or course of treatment. Therefore, we ask that you please give us 2 business days to thoroughly review all your results before contacting the office for clarification. Should we see a critical lab result, you will be contacted sooner.   If You Need Anything After Your Visit  If you have any questions or concerns for your doctor, please call our main line at [phone] and press option 4 to reach your doctor's medical assistant. If no one answers, please leave a voicemail as directed and we will return your call as soon as possible. Messages left after 4 pm will be answered the following business day.   You may also send us a message via MyChart. We typically respond to MyChart messages within 1-2 business days.  For prescription refills, please ask your pharmacy to contact our office. Our fax number is [phone].  If you have an urgent issue when the clinic is closed that cannot wait until the next business day, you can page your doctor at the number below.    Please note that while we do our best to be available for urgent issues outside of office hours, we are not available 24/7.   If you have an urgent issue and are unable to reach us, you may choose to seek medical care at your doctor's office, retail clinic, urgent care center, or emergency room.  If you have a medical emergency, please immediately call 911 or go to the  emergency department.  Pager Numbers  - Dr. Kowalski: [phone]  - Dr. Moye: [phone]  - Dr. Stewart: [phone]  In the event of inclement weather, please call our main line at [phone] for an update on the status of any delays or closures.  Dermatology Medication Tips: Please keep the boxes that topical medications come in in order to help keep track of the instructions about where and how to use these. Pharmacies typically print the medication instructions only on the boxes and not directly on the medication tubes.   If your medication is too expensive, please contact our office at [phone] option 4 or send us a message through MyChart.   We are unable to tell what your co-pay for medications will be in advance as this is different depending on your insurance coverage. However, we may be able to find a substitute medication at lower cost or fill out paperwork to get insurance to cover a needed medication.   If a prior authorization is required to get your medication covered by your insurance company, please allow us 1-2 business days to complete this process.  Drug prices often vary depending on where the prescription is filled and some pharmacies may offer cheaper prices.  The website www.goodrx.com contains coupons for medications through different pharmacies. The prices here do not account for what the cost may be with help from insurance (it may be cheaper with your insurance), but the website can   give you the price if you did not use any insurance.  - You can print the associated coupon and take it with your prescription to the pharmacy.  - You may also stop by our office during regular business hours and pick up a GoodRx coupon card.  - If you need your prescription sent electronically to a different pharmacy, notify our office through [facility] MyChart or by phone at [phone] option 4.     Si Usted Necesita Algo Despus de Su Visita  Tambin puede  enviarnos un mensaje a travs de MyChart. Por lo general respondemos a los mensajes de MyChart en el transcurso de 1 a 2 das hbiles.  Para renovar recetas, por favor pida a su farmacia que se ponga en contacto con nuestra oficina. Nuestro nmero de fax es el [phone].  Si tiene un asunto urgente cuando la clnica est cerrada y que no puede esperar hasta el siguiente da hbil, puede llamar/localizar a su doctor(a) al nmero que aparece a continuacin.   Por favor, tenga en cuenta que aunque hacemos todo lo posible para estar disponibles para asuntos urgentes fuera del horario de oficina, no estamos disponibles las 24 horas del da, los 7 das de la semana.   Si tiene un problema urgente y no puede comunicarse con nosotros, puede optar por buscar atencin mdica  en el consultorio de su doctor(a), en una clnica privada, en un centro de atencin urgente o en una sala de emergencias.  Si tiene una emergencia mdica, por favor llame inmediatamente al 911 o vaya a la sala de emergencias.  Nmeros de bper  - Dr. Kowalski: [phone]  - Dra. Moye: [phone]  - Dra. Stewart: [phone]  En caso de inclemencias del tiempo, por favor llame a nuestra lnea principal al [phone] para una actualizacin sobre el estado de cualquier retraso o cierre.  Consejos para la medicacin en dermatologa: Por favor, guarde las cajas en las que vienen los medicamentos de uso tpico para ayudarle a seguir las instrucciones sobre dnde y cmo usarlos. Las farmacias generalmente imprimen las instrucciones del medicamento slo en las cajas y no directamente en los tubos del medicamento.   Si su medicamento es muy caro, por favor, pngase en contacto con nuestra oficina llamando al [phone] y presione la opcin 4 o envenos un mensaje a travs de MyChart.   No podemos decirle cul ser su copago por los medicamentos por adelantado ya que esto es diferente dependiendo de la cobertura de su seguro.  Sin embargo, es posible que podamos encontrar un medicamento sustituto a menor costo o llenar un formulario para que el seguro cubra el medicamento que se considera necesario.   Si se requiere una autorizacin previa para que su compaa de seguros cubra su medicamento, por favor permtanos de 1 a 2 das hbiles para completar este proceso.  Los precios de los medicamentos varan con frecuencia dependiendo del lugar de dnde se surte la receta y alguna farmacias pueden ofrecer precios ms baratos.  El sitio web www.goodrx.com tiene cupones para medicamentos de diferentes farmacias. Los precios aqu no tienen en cuenta lo que podra costar con la ayuda del seguro (puede ser ms barato con su seguro), pero el sitio web puede darle el precio si no utiliz ningn seguro.  - Puede imprimir el cupn correspondiente y llevarlo con su receta a la farmacia.  - Tambin puede pasar por nuestra oficina durante el horario de atencin regular y recoger una tarjeta de cupones de GoodRx.  -   Si necesita que su receta se enve electrnicamente a una farmacia diferente, informe a nuestra oficina a travs de MyChart de [facility] o por telfono llamando al [phone] y presione la opcin 4.  

## 2021-09-26 NOTE — Progress Notes (Signed)
Subjective:    Patient ID: [name] [name], male    DOB: 03/19/1950, 71 y.o.   MRN: [id number]  HPI Here to follow up on issues. His atrial fibrillation has been mostly controlled. He senses this about 2-3 times a month, and it lasts about 10-20 seconds before it stops. These spells do not bother him much, but sometimes they can make him slightly SOB. He will often take an extra Atenolol when this affects him. His BP is stable. He is being seen by Dr. Sherlynn Stalls for retinal edema, and he has received several laser treatments. He is seeing Dr. Vevelyn Royals for cataracts. He recently had a left ureteral stone removed with a cystoscopy per Dr. John Giovanni. His GERD is stable. He also complains of feeling tired and sleepy all the time. His wife says he snores loudly and he tosses and turns at night.    Review of Systems  Constitutional:  Positive for fatigue.  HENT: Negative.    Eyes: Negative.   Respiratory: Negative.    Cardiovascular:  Positive for palpitations.  Gastrointestinal: Negative.   Genitourinary: Negative.   Musculoskeletal: Negative.   Skin: Negative.   Neurological: Negative.   Psychiatric/Behavioral: Negative.         Objective:   Physical Exam Constitutional:      General: He is not in acute distress.    Appearance: Normal appearance. He is well-developed. He is not ill-appearing or diaphoretic.  HENT:     Head: Normocephalic and atraumatic.     Right Ear: External ear normal.     Left Ear: External ear normal.     Nose: Nose normal.     Mouth/Throat:     Pharynx: No oropharyngeal exudate.  Eyes:     General: No scleral icterus.       Right eye: No discharge.        Left eye: No discharge.     Conjunctiva/sclera: Conjunctivae normal.     Pupils: Pupils are equal, round, and reactive to light.  Neck:     Thyroid: No thyromegaly.     Vascular: No JVD.     Trachea: No tracheal deviation.  Cardiovascular:     Rate and Rhythm: Normal rate and regular  rhythm.     Heart sounds: Normal heart sounds. No murmur heard.    No friction rub. No gallop.  Pulmonary:     Effort: Pulmonary effort is normal. No respiratory distress.     Breath sounds: Normal breath sounds. No wheezing or rales.  Chest:     Chest wall: No tenderness.  Abdominal:     General: Bowel sounds are normal. There is no distension.     Palpations: Abdomen is soft. There is no mass.     Tenderness: There is no abdominal tenderness. There is no guarding or rebound.  Genitourinary:    Penis: Normal. No tenderness.      Testes: Normal.     Prostate: Normal.     Rectum: Normal. Guaiac result negative.  Musculoskeletal:        General: No tenderness. Normal range of motion.     Cervical back: Neck supple.  Lymphadenopathy:     Cervical: No cervical adenopathy.  Skin:    General: Skin is warm and dry.     Coloration: Skin is not pale.     Findings: No erythema or rash.  Neurological:     Mental Status: He is alert and oriented to person, place, and time.  Cranial Nerves: No cranial nerve deficit.     Motor: No abnormal muscle tone.     Coordination: Coordination normal.     Deep Tendon Reflexes: Reflexes are normal and symmetric. Reflexes normal.  Psychiatric:        Behavior: Behavior normal.        Thought Content: Thought content normal.        Judgment: Judgment normal.           Assessment & Plan:  His HTN and atrial fibrillation are stable. His GERD and anxiety are stable. He will follow up with his ophthalmologists for retinal edema, cataracts, and astigmatism. His ED is stable. Finally he likely has sleep apnea, so we will refer him for a sleep study.  Alysia Penna, MD

## 2021-09-26 NOTE — Progress Notes (Signed)
Follow-Up Visit   Subjective  [name] [name] is a 71 y.o. male who presents for the following: Annual Exam (No history of skin cancer or abnormal moles - The patient presents for Total-Body Skin Exam (TBSE) for skin cancer screening and mole check.  The patient has spots, moles and lesions to be evaluated, some may be new or changing and the patient has concerns that these could be cancer./).  The following portions of the chart were reviewed this encounter and updated as appropriate:   Tobacco  Allergies  Meds  Problems  Med Hx  Surg Hx  Fam Hx     Review of Systems:  No other skin or systemic complaints except as noted in HPI or Assessment and Plan.  Objective  Well appearing patient in no apparent distress; mood and affect are within normal limits.  A full examination was performed including scalp, head, eyes, ears, nose, lips, neck, chest, axillae, abdomen, back, buttocks, bilateral upper extremities, bilateral lower extremities, hands, feet, fingers, toes, fingernails, and toenails. All findings within normal limits unless otherwise noted below.  Scalp, ears Scaly plaques of post scalp  Face (4) Erythematous thin papules/macules with gritty scale.   Left Thigh - Anterior Spider veins   Assessment & Plan   Lentigines - Scattered tan macules - Due to sun exposure - Benign-appearing, observe - Recommend daily broad spectrum sunscreen SPF 30+ to sun-exposed areas, reapply every 2 hours as needed. - Call for any changes  Seborrheic Keratoses - Stuck-on, waxy, tan-brown papules and/or plaques  - Benign-appearing - Discussed benign etiology and prognosis. - Observe - Call for any changes  Melanocytic Nevi - Tan-brown and/or pink-flesh-colored symmetric macules and papules - Benign appearing on exam today - Observation - Call clinic for new or changing moles - Recommend daily use of broad spectrum spf 30+ sunscreen to sun-exposed areas.   Hemangiomas - Red  papules - Discussed benign nature - Observe - Call for any changes  Actinic Damage - Chronic condition, secondary to cumulative UV/sun exposure - diffuse scaly erythematous macules with underlying dyspigmentation - Recommend daily broad spectrum sunscreen SPF 30+ to sun-exposed areas, reapply every 2 hours as needed.  - Staying in the shade or wearing long sleeves, sun glasses (UVA+UVB protection) and wide brim hats (4-inch brim around the entire circumference of the hat) are also recommended for sun protection.  - Call for new or changing lesions.  Skin cancer screening performed today.  Psoriasis Scalp, ears Psoriasis is a chronic non-curable, but treatable genetic/hereditary disease that may have other systemic features affecting other organ systems such as joints (Psoriatic Arthritis). It is associated with an increased risk of inflammatory bowel disease, heart disease, non-alcoholic fatty liver disease, and depression.     Chronic and persistent condition with duration or expected duration over one year. Condition is symptomatic/ bothersome to patient. Not currently at goal.  Discussed Zoryve cream. Patient asked that it not be sent in at this time. He will call back if he changes his mind.Sample given today  Continue Mometasone lotion qhs 5 times per week to scalp, Fluocinolone oil 3 times per week prn to ears  mometasone (ELOCON) 0.1 % lotion - Scalp, ears For psoriasis of the scalp apply to aa's QHS and wash out in the morning. Use up to 5d/wk PRN. Avoid f/g/a.  Fluocinolone Acetonide (DERMOTIC) 0.01 % OIL - Scalp, ears Place 1-2 drops in ear(s) 3 (three) times a week. 1-2 gtts to aa ears for psoriasis up to  3 days a week prn flares  AK (actinic keratosis) (4) Face Destruction of lesion - Face Complexity: simple   Destruction method: cryotherapy   Informed consent: discussed and consent obtained   Timeout:  patient name, date of birth, surgical site, and procedure  verified Lesion destroyed using liquid nitrogen: Yes   Region frozen until ice ball extended beyond lesion: Yes   Outcome: patient tolerated procedure well with no complications   Post-procedure details: wound care instructions given    Spider veins Left Thigh - Anterior Discussed sclerotherapy. Advised patient fee is $350/treatment and may require more than one treatment for best results - best time to treat is fall, winter, spring. Patient may consider.  Inflamed seborrheic keratosis (17) Ant waistline x 16, left pretibial x 1 Symptomatic, irritating, patient would like treated. Destruction of lesion - Ant waistline x 16, left pretibial x 1 Complexity: simple   Destruction method: cryotherapy   Informed consent: discussed and consent obtained   Timeout:  patient name, date of birth, surgical site, and procedure verified Lesion destroyed using liquid nitrogen: Yes   Region frozen until ice ball extended beyond lesion: Yes   Outcome: patient tolerated procedure well with no complications   Post-procedure details: wound care instructions given    Return in about 1 year (around 09/27/2022) for TBSE.  I, Ashok Cordia, CMA, am acting as scribe for Sarina Ser, MD . Documentation: I have reviewed the above documentation for accuracy and completeness, and I agree with the above.  Sarina Ser, MD

## 2021-09-27 ENCOUNTER — Telehealth: Payer: Self-pay | Admitting: Family Medicine

## 2021-09-27 MED ORDER — IRON (FERROUS SULFATE) 325 (65 FE) MG PO TABS: 325.0000 mg | ORAL_TABLET | Freq: Every day | ORAL | 3 refills | Status: DC

## 2021-09-27 NOTE — Addendum Note (Signed)
Addended by: Agnes Lawrence on: 09/27/2021 11:41 AM   Modules accepted: Orders

## 2021-09-27 NOTE — Telephone Encounter (Signed)
[facility] called stating pt is currently out of town and had a refill sent to his pharm for his Iron however he won't be back in town until [employer]. Pt is wondering if he can take an over the counter Iron supplement and if so what kind and dosage?  Please advise.

## 2021-09-27 NOTE — Telephone Encounter (Signed)
Please advise 

## 2021-09-30 ENCOUNTER — Other Ambulatory Visit (INDEPENDENT_AMBULATORY_CARE_PROVIDER_SITE_OTHER): Payer: Medicare HMO

## 2021-09-30 ENCOUNTER — Telehealth: Payer: Self-pay | Admitting: Family Medicine

## 2021-09-30 DIAGNOSIS — D649 Anemia, unspecified: Secondary | ICD-10-CM

## 2021-09-30 LAB — VITAMIN B12: Vitamin B-12: 413 pg/mL (ref 211–911)

## 2021-09-30 LAB — IRON: Iron: 300 ug/dL — ABNORMAL HIGH (ref 42–165)

## 2021-09-30 LAB — FERRITIN: Ferritin: 8.7 ng/mL — ABNORMAL LOW (ref 22.0–322.0)

## 2021-09-30 NOTE — Telephone Encounter (Signed)
Pt wants to start the diabetic medication discussed during an earlier conversation . Patient could not recall the name of the medication.

## 2021-09-30 NOTE — Telephone Encounter (Signed)
Pt states that his pharmacy does not have Rx for Metformin, what strength/dose of Metformin should pt  be taking so I can send it to his pharmacy. Please advise

## 2021-09-30 NOTE — Progress Notes (Unsigned)
z

## 2021-09-30 NOTE — Telephone Encounter (Signed)
Yes he can take OTC iron pills (try to take a total of 325 mg a day)

## 2021-09-30 NOTE — Telephone Encounter (Signed)
He is referring to Metformin. I already sent this to his pharmacy so they should be able to fill it

## 2021-09-30 NOTE — Telephone Encounter (Signed)
Last OV- 09/26/21.  Please advise

## 2021-10-01 ENCOUNTER — Telehealth: Payer: Self-pay | Admitting: Family Medicine

## 2021-10-01 NOTE — Telephone Encounter (Signed)
[facility] pharm is calling and still waiting on metformin to be sent to  [place], [place] Phone:  [phone]  Fax:  [phone]

## 2021-10-01 NOTE — Telephone Encounter (Signed)
Pt called again to state he spoke to the pharmacy and they are still saying they have nothing for him (Metformin)  Pt states Rx should've gone to  [facility], [place] Phone:  [phone]  Fax:  [phone]      Pt would like confirmation that Rx went there, and not the [facility] on file.  Please advise.

## 2021-10-01 NOTE — Telephone Encounter (Signed)
Metformin not listed on medication list.     Pharmacy updated to [facility].

## 2021-10-01 NOTE — Telephone Encounter (Signed)
See his other phone note. Make sure the Metformin went to the right pharmacy please

## 2021-10-01 NOTE — Telephone Encounter (Signed)
Call in Metformin 500 mg BID, #180 with 3 rf

## 2021-10-02 ENCOUNTER — Encounter: Payer: Self-pay | Admitting: Family Medicine

## 2021-10-02 ENCOUNTER — Other Ambulatory Visit: Payer: Self-pay

## 2021-10-02 ENCOUNTER — Ambulatory Visit (INDEPENDENT_AMBULATORY_CARE_PROVIDER_SITE_OTHER): Payer: Medicare HMO | Admitting: Family Medicine

## 2021-10-02 VITALS — BP 106/74 | HR 61 | Temp 98.2°F | Wt 225.0 lb

## 2021-10-02 DIAGNOSIS — E119 Type 2 diabetes mellitus without complications: Secondary | ICD-10-CM | POA: Diagnosis not present

## 2021-10-02 DIAGNOSIS — D509 Iron deficiency anemia, unspecified: Secondary | ICD-10-CM | POA: Diagnosis not present

## 2021-10-02 MED ORDER — METFORMIN HCL 500 MG PO TABS: 500.0000 mg | ORAL_TABLET | Freq: Two times a day (BID) | ORAL | 3 refills | Status: DC

## 2021-10-02 NOTE — Telephone Encounter (Signed)
Pt was notified of Dr Sarajane Jews advise

## 2021-10-02 NOTE — Telephone Encounter (Signed)
Patient aware prescription for Metformin '500mg'$  sent to [facility] on [address] in [place].

## 2021-10-02 NOTE — Progress Notes (Signed)
   Subjective:    Patient ID: [name] [name], male    DOB: 08-19-1950, 71 y.o.   MRN: [id number]  HPI Here with his wife to answer questions about his recent lab results. He was here on 09-26-21 for a well exam, and his labs returned showing an A1c of 7.6%, a Hgb of 11.4 (this was 13.4 one year ago), iron of 300, and ferritin of 8.7. He was just started on iron pills a few days before this was drawn. His last colonoscopy in 2019 showed benign polyps and he was given 10 years before the next one is due. His only symptom is some mild fatigue.    Review of Systems  Constitutional:  Positive for fatigue.  Respiratory: Negative.    Cardiovascular: Negative.        Objective:   Physical Exam Constitutional:      Appearance: Normal appearance.  Cardiovascular:     Rate and Rhythm: Normal rate and regular rhythm.     Pulses: Normal pulses.     Heart sounds: Normal heart sounds.  Pulmonary:     Effort: Pulmonary effort is normal.     Breath sounds: Normal breath sounds.  Neurological:     Mental Status: He is alert.           Assessment & Plan:  He has type 2 diabetes, and he has started taking metformin 500 mg BID for this. He also has some mild iron deficiency anemia, and he has started taking one 325 mg iron pill a day. We discussed the treatments available for both of these. We will repeat iron studies and an A1c in 90 days.  Alysia Penna, MD

## 2021-10-03 ENCOUNTER — Other Ambulatory Visit: Payer: Medicare HMO

## 2021-10-03 NOTE — Telephone Encounter (Signed)
Pt had OV  on 10/02/21 with Dr Sarajane Jews, state that he finally picked up Rx form his pharmacy

## 2021-10-05 ENCOUNTER — Encounter: Payer: Self-pay | Admitting: Dermatology

## 2021-10-10 ENCOUNTER — Telehealth: Payer: Self-pay

## 2021-10-10 NOTE — Telephone Encounter (Signed)
Pt PA for Sildenafil was sent to pt plan Your information has been submitted to [place] [employer]. [employer] will review the request and will issue a decision, typically within 1-3 days from your submission.

## 2021-10-28 ENCOUNTER — Encounter: Payer: Self-pay | Admitting: Primary Care

## 2021-10-28 ENCOUNTER — Ambulatory Visit (INDEPENDENT_AMBULATORY_CARE_PROVIDER_SITE_OTHER): Payer: Medicare HMO | Admitting: Primary Care

## 2021-10-28 VITALS — BP 122/80 | HR 55 | Temp 97.9°F | Ht 72.0 in | Wt 228.0 lb

## 2021-10-28 DIAGNOSIS — R0683 Snoring: Secondary | ICD-10-CM | POA: Diagnosis not present

## 2021-10-28 NOTE — Patient Instructions (Addendum)
  Sleep apnea is defined as period of 10 seconds or longer when you stop breathing at night. This can happen multiple times a night. Dx sleep apnea is when this occurs more than 5 times an hour.    Mild OSA 5-15 apneic events an hour Moderate OSA 15-30 apneic events an hour Severe OSA > 30 apneic events an hour   Untreated sleep apnea puts you at higher risk for cardiac arrhythmias, pulmonary HTN, stroke and diabetes   Treatment options include weight loss, side sleeping position, oral appliance, CPAP therapy or referral to ENT for possible surgical options    Recommendations: Focus on side sleeping position Work on weight loss efforts  Do not drive if experiencing excessive daytime sleepiness of fatigue    Orders: Home sleep study re: loud snoring    Follow-up: Please schedule follow-up 1-2 weeks after completing home sleep study to review results and treatment if needed

## 2021-10-28 NOTE — Progress Notes (Signed)
$'@Patient'S$  ID: [name] [name], male    DOB: 10-07-1950, 71 y.o.   MRN: [id number]  Chief Complaint  Patient presents with   Consult    Referring provider: Laurey Morale, MD  HPI: 71 year old male, former smoker.  Past medical history significant for A-fib, hypertension, type 2 diabetes, GERD, hyperlipidemia, iron deficiency anemia, osteopenia.  10/28/2021 Patient presents today for sleep consult. He has symptoms of loud snoring, insomnia, restless sleep and daytime sleepiness. Newly diagnosed with diabetes and iron deficiency anemia, states that he has been sleeping better since being on medication. He has a sleep number bed and score has also improved. He has history of afib, atenolol will make him tired. He can tell when he goes into arrhythmia which tends to happen twice a year, he has flecainide available prn. Typical bedtime is around 11:30 pm, it can take several hours to fall asleep. He wakes up between 3-4 times a night to use restroom. He starts his day between 7-8am. No previous sleep studies. Epworth score 14. No symptoms of narcolepsy or cataplexy.  Sleep questionnaire Symptoms- Snoring, restless sleep and daytime fatigue  Prior sleep study- None Bedtime- 11:30PM Time to fall asleep- varies; several hours at times Nocturnal awakenings- 3-4 times Out of bed/start of day- 7-8am Weight changes- fluctuates 10-15 lbs  Do you operate heavy machinery- No Do you currently wear CPAP- No Do you current wear oxygen- No Epworth- 14   Allergies  Allergen Reactions   Oxycodone Nausea And Vomiting   Paroxetine Nausea And Vomiting and Other (See Comments)    Altered mental state   Sulfa Antibiotics Hives     There is no immunization history on file for this patient.  Past Medical History:  Diagnosis Date   Allergy    Anxiety    Arthritis    Atrial fibrillation ([place])    sees Dr. Lovena Le   Cataract    forming   Chronic kidney disease    kidney stone   Depression     Dizziness    or vertigo   Dysrhythmia    atrial fibrillation, once or twice a year   GERD (gastroesophageal reflux disease)    Headache    eyglass flicker in eye is migraine symptoms   History of kidney stones    HLD (hyperlipidemia)    HTN (hypertension)    Osteopenia    Retinal tear    Sinus bradycardia     Tobacco History: Social History   Tobacco Use  Smoking Status Former   Types: Cigarettes   Quit date: 03/11/1975   Years since quitting: 46.6  Smokeless Tobacco Never   Counseling given: Not Answered   Outpatient Medications Prior to Visit  Medication Sig Dispense Refill   aspirin EC 325 MG tablet Take 1 tablet (325 mg total) by mouth daily. 30 tablet 0   atenolol (TENORMIN) 25 MG tablet Take 1 tablet (25 mg total) by mouth 3 (three) times daily. 270 tablet 3   atorvastatin (LIPITOR) 40 MG tablet Take 1 tablet (40 mg total) by mouth daily. 90 tablet 3   CALCIUM-MAGNESIUM-ZINC PO Take 1 tablet by mouth in the morning.     Cholecalciferol (VITAMIN D3 PO) Take 2,000 Units by mouth in the morning.     Fluocinolone Acetonide (DERMOTIC) 0.01 % OIL Place 1-2 drops in ear(s) 3 (three) times a week. 1-2 gtts to aa ears for psoriasis up to 3 days a week prn flares 20 mL 1   fluticasone (FLONASE) 50  MCG/ACT nasal spray Place 2 sprays into both nostrils in the morning.     Iron, Ferrous Sulfate, 325 (65 Fe) MG TABS Take 325 mg by mouth daily. 90 tablet 3   lisinopril (ZESTRIL) 5 MG tablet Take 1 tablet (5 mg total) by mouth daily. 90 tablet 3   metFORMIN (GLUCOPHAGE) 500 MG tablet Take 1 tablet (500 mg total) by mouth 2 (two) times daily with a meal. 180 tablet 3   mometasone (ELOCON) 0.1 % lotion For psoriasis of the scalp apply to aa's QHS and wash out in the morning. Use up to 5d/wk PRN. Avoid f/g/a. 60 mL 11   sildenafil (REVATIO) 20 MG tablet Take 5 tablets (100 mg total) by mouth as needed. 150 tablet 3   venlafaxine XR (EFFEXOR-XR) 37.5 MG 24 hr capsule Take 1 capsule (37.5 mg  total) by mouth daily with breakfast. 90 capsule 3   flecainide (TAMBOCOR) 100 MG tablet MAY TAKE TWO TABLETS BY MOUTH DAILY AS NEEDED FOR BREAKTHROUGH A-FIB (Patient not taking: Reported on 10/28/2021) 30 tablet 2   meloxicam (MOBIC) 15 MG tablet Take 1 tablet (15 mg total) by mouth daily. (Patient not taking: Reported on 10/28/2021) 90 tablet 3   No facility-administered medications prior to visit.   Review of Systems  Review of Systems  Constitutional:  Positive for fatigue.  HENT: Negative.    Respiratory: Negative.    Cardiovascular: Negative.   Psychiatric/Behavioral:  Positive for sleep disturbance.      Physical Exam  BP 122/80 (BP Location: Right Arm, Patient Position: Sitting, Cuff Size: Large)   Pulse (!) 55   Temp 97.9 F (36.6 C) (Oral)   Ht 6' (1.829 m)   Wt 228 lb (103.4 kg)   SpO2 98%   BMI 30.92 kg/m  Physical Exam Constitutional:      Appearance: Normal appearance.  HENT:     Head: Normocephalic and atraumatic.     Mouth/Throat:     Mouth: Mucous membranes are moist.     Pharynx: Oropharynx is clear.     Comments: Mallampati class 0 Cardiovascular:     Rate and Rhythm: Normal rate and regular rhythm.     Comments: RRR Pulmonary:     Effort: Pulmonary effort is normal.     Breath sounds: Normal breath sounds.  Musculoskeletal:        General: Normal range of motion.     Cervical back: Normal range of motion and neck supple.  Skin:    General: Skin is warm and dry.  Neurological:     General: No focal deficit present.     Mental Status: He is alert and oriented to person, place, and time. Mental status is at baseline.  Psychiatric:        Mood and Affect: Mood normal.        Behavior: Behavior normal.        Thought Content: Thought content normal.        Judgment: Judgment normal.      Lab Results:  CBC    Component Value Date/Time   WBC 6.7 09/26/2021 1236   RBC 4.87 09/26/2021 1236   HGB 11.4 (L) 09/26/2021 1236   HCT 36.2 (L)  09/26/2021 1236   PLT 187.0 09/26/2021 1236   MCV 74.3 (L) 09/26/2021 1236   MCH 24.6 (L) 05/14/2021 0510   MCHC 31.5 09/26/2021 1236   RDW 15.3 09/26/2021 1236   LYMPHSABS 1.5 09/26/2021 1236   MONOABS 0.7 09/26/2021 1236  EOSABS 0.3 09/26/2021 1236   BASOSABS 0.1 09/26/2021 1236    BMET    Component Value Date/Time   NA 139 09/26/2021 1236   K 4.5 09/26/2021 1236   CL 103 09/26/2021 1236   CO2 30 09/26/2021 1236   GLUCOSE 111 (H) 09/26/2021 1236   BUN 20 09/26/2021 1236   CREATININE 1.02 09/26/2021 1236   CREATININE 1.04 02/27/2020 1257   CALCIUM 8.7 09/26/2021 1236   GFRNONAA >60 05/14/2021 0510   GFRAA 45 (L) 01/27/2018 1748    BNP No results found for: "BNP"  ProBNP No results found for: "PROBNP"  Imaging: No results found.   Assessment & Plan:   Loud snoring - Patient has symptoms of snoring, insomnia and restless sleep. Hx paroxsymal afib. Epworth score 14. BMI 30. Concern patient could have underlying obstructive sleep apnea, needs home sleep study to evaluate.  Discussed risks of untreated sleep apnea including cardiac arrhythmias, stroke, pulmonary hypertension and diabetes.  We also reviewed treatment options including weight loss, oral appliance, CPAP therapy or referral to ENT for possible surgical options.  Encourage side sleeping position.  Advised against driving if experiencing excessive daytime sleepiness or fatigue.  Follow-up 1 to 2 weeks after sleep study to review results and treatment options if needed.    Martyn Ehrich, NP 10/28/2021

## 2021-10-28 NOTE — Assessment & Plan Note (Addendum)
-   Patient has symptoms of snoring, insomnia and restless sleep. Hx paroxsymal afib. Epworth score 14. BMI 30. Concern patient could have underlying obstructive sleep apnea, needs home sleep study to evaluate.  Discussed risks of untreated sleep apnea including cardiac arrhythmias, stroke, pulmonary hypertension and diabetes.  We also reviewed treatment options including weight loss, oral appliance, CPAP therapy or referral to ENT for possible surgical options.  Encourage side sleeping position.  Advised against driving if experiencing excessive daytime sleepiness or fatigue.  Follow-up 1 to 2 weeks after sleep study to review results and treatment options if needed.

## 2021-11-02 NOTE — Progress Notes (Signed)
Reviewed and agree with assessment/plan.   Mohamud Mrozek, MD  Pulmonary/Critical Care 11/02/2021, 2:25 PM Pager:  [phone]  

## 2021-11-02 NOTE — Progress Notes (Signed)
Reviewed and agree with assessment/plan.   Denine Brotz, MD [facility]/Critical Care 11/02/2021, 2:25 PM Pager:  [phone]  

## 2021-11-06 DIAGNOSIS — H25042 Posterior subcapsular polar age-related cataract, left eye: Secondary | ICD-10-CM | POA: Diagnosis not present

## 2021-11-06 DIAGNOSIS — H18413 Arcus senilis, bilateral: Secondary | ICD-10-CM | POA: Diagnosis not present

## 2021-11-06 DIAGNOSIS — H35371 Puckering of macula, right eye: Secondary | ICD-10-CM | POA: Diagnosis not present

## 2021-11-06 DIAGNOSIS — H2512 Age-related nuclear cataract, left eye: Secondary | ICD-10-CM | POA: Diagnosis not present

## 2021-11-27 ENCOUNTER — Ambulatory Visit: Payer: Medicare HMO | Admitting: Urology

## 2021-11-27 ENCOUNTER — Encounter: Payer: Self-pay | Admitting: Urology

## 2021-11-27 ENCOUNTER — Ambulatory Visit
Admission: RE | Admit: 2021-11-27 | Discharge: 2021-11-27 | Disposition: A | Discharge status: Home / Self Care | Payer: Medicare HMO | Source: Ambulatory Visit | Attending: Urology | Admitting: Urology

## 2021-11-27 VITALS — BP 134/78 | HR 66 | Ht 72.0 in | Wt 229.0 lb

## 2021-11-27 DIAGNOSIS — N200 Calculus of kidney: Secondary | ICD-10-CM

## 2021-11-27 DIAGNOSIS — Z87442 Personal history of urinary calculi: Secondary | ICD-10-CM

## 2021-11-27 NOTE — Progress Notes (Signed)
11/27/2021 5:52 PM   [name] [name] 28-Jul-1950 [id number]  Referring provider: Laurey Morale, MD [place],  [place] [id number]  Chief Complaint  Patient presents with   Nephrolithiasis    Urologic history: 1.  Recurrent nephrolithiasis Ureteroscopic removal left proximal ureteral calculus and renal calculi x2 9/0/3833 Declined metabolic evaluation  HPI: 71 y.o. male presents for 6 month follow-up.  Doing well since last visit No bothersome LUTS Denies dysuria, gross hematuria Denies flank, abdominal or pelvic pain Decided not to pursue a metabolic evaluation   PMH: Past Medical History:  Diagnosis Date   Allergy    Anxiety    Arthritis    Atrial fibrillation ([place])    sees Dr. Lovena Le   Cataract    forming   Chronic kidney disease    kidney stone   Depression    Dizziness    or vertigo   Dysrhythmia    atrial fibrillation, once or twice a year   GERD (gastroesophageal reflux disease)    Headache    eyglass flicker in eye is migraine symptoms   History of kidney stones    HLD (hyperlipidemia)    HTN (hypertension)    Osteopenia    Retinal tear    Sinus bradycardia     Surgical History: Past Surgical History:  Procedure Laterality Date   CARDIAC CATHETERIZATION  2003   normal   COLONOSCOPY  01/04/2018   per Dr. Fuller Plan, adenomatous  polyp, repeat in 5 years   CYSTOSCOPY/URETEROSCOPY/HOLMIUM LASER/STENT PLACEMENT Left 05/16/2021   Procedure: CYSTOSCOPY/URETEROSCOPY/HOLMIUM LASER/STENT PLACEMENT;  Surgeon: Abbie Sons, MD;  Location: [facility] ORS;  Service: Urology;  Laterality: Left;   EXTRACORPOREAL SHOCK WAVE LITHOTRIPSY Left 01/21/2018   Procedure: EXTRACORPOREAL SHOCK WAVE LITHOTRIPSY (ESWL);  Surgeon: Hollice Espy, MD;  Location: [facility] ORS;  Service: Urology;  Laterality: Left;   EYE SURGERY     retinal   POLYPECTOMY     RETINAL LASER PROCEDURE Right    repair retinal tear    WART REMOVAL     LEFT UPPER ARM   WISDOM TOOTH  EXTRACTION      Home Medications:  Allergies as of 11/27/2021       Reactions   Oxycodone Nausea And Vomiting   Paroxetine Nausea And Vomiting, Other (See Comments)   Altered mental state   Sulfa Antibiotics Hives        Medication List        Accurate as of November 27, 2021  5:52 PM. If you have any questions, ask your nurse or doctor.          aspirin EC 325 MG tablet Take 1 tablet (325 mg total) by mouth daily.   atenolol 25 MG tablet Commonly known as: TENORMIN Take 1 tablet (25 mg total) by mouth 3 (three) times daily.   atorvastatin 40 MG tablet Commonly known as: LIPITOR Take 1 tablet (40 mg total) by mouth daily.   CALCIUM-MAGNESIUM-ZINC PO Take 1 tablet by mouth in the morning.   flecainide 100 MG tablet Commonly known as: TAMBOCOR MAY TAKE TWO TABLETS BY MOUTH DAILY AS NEEDED FOR BREAKTHROUGH A-FIB   Fluocinolone Acetonide 0.01 % Oil Commonly known as: DermOtic Place 1-2 drops in ear(s) 3 (three) times a week. 1-2 gtts to aa ears for psoriasis up to 3 days a week prn flares   fluticasone 50 MCG/ACT nasal spray Commonly known as: FLONASE Place 2 sprays into both nostrils in the morning.   Iron (Ferrous Sulfate)  325 (65 Fe) MG Tabs Take 325 mg by mouth daily.   lisinopril 5 MG tablet Commonly known as: ZESTRIL Take 1 tablet (5 mg total) by mouth daily.   meloxicam 15 MG tablet Commonly known as: MOBIC Take 1 tablet (15 mg total) by mouth daily.   metFORMIN 500 MG tablet Commonly known as: GLUCOPHAGE Take 1 tablet (500 mg total) by mouth 2 (two) times daily with a meal.   mometasone 0.1 % lotion Commonly known as: ELOCON For psoriasis of the scalp apply to aa's QHS and wash out in the morning. Use up to 5d/wk PRN. Avoid f/g/a.   sildenafil 20 MG tablet Commonly known as: REVATIO Take 5 tablets (100 mg total) by mouth as needed.   venlafaxine XR 37.5 MG 24 hr capsule Commonly known as: EFFEXOR-XR Take 1 capsule (37.5 mg total) by  mouth daily with breakfast.   VITAMIN D3 PO Take 2,000 Units by mouth in the morning.        Allergies:  Allergies  Allergen Reactions   Oxycodone Nausea And Vomiting   Paroxetine Nausea And Vomiting and Other (See Comments)    Altered mental state   Sulfa Antibiotics Hives    Family History: Family History  Problem Relation Age of Onset   Hodgkin's lymphoma Father    Hyperlipidemia Other    Hypertension Mother    Stroke Mother    Heart attack Brother    Prostate cancer Brother        1st degree relative <50   Diabetes Brother        1st degree relative   Multiple myeloma Brother    Hypertension Brother    Heart disease Brother    Heart attack Brother    Colon polyps Brother    Colon cancer Neg Hx    Esophageal cancer Neg Hx    Rectal cancer Neg Hx    Stomach cancer Neg Hx     Social History:  reports that he quit smoking about 46 years ago. His smoking use included cigarettes. He has never used smokeless tobacco. He reports current alcohol use. He reports that he does not use drugs.   Physical Exam: BP 134/78   Pulse 66   Ht 6' (1.829 m)   Wt 229 lb (103.9 kg)   BMI 31.06 kg/m   Constitutional:  Alert and oriented, No acute distress. HEENT: [place] AT Respiratory: Normal respiratory effort, no increased work of breathing. Psychiatric: Normal mood and affect.   Pertinent Imaging: Images of a KUB performed today were personally reviewed and interpreted.  Probable small punctate left lower pole renal calculus   Assessment & Plan:    1. Recurrent nephrolithiasis No definite evidence of recurrent stone formation 1 year follow-up with KUB General stone prevention guidelines were discussed including increasing water intake to keep urine output >2 L/day Return earlier for recurrent flank pain/stone symptoms   Abbie Sons, MD  [facility] [address] [place], [place] [address] [phone]

## 2021-12-31 ENCOUNTER — Other Ambulatory Visit: Payer: Self-pay

## 2021-12-31 ENCOUNTER — Other Ambulatory Visit (INDEPENDENT_AMBULATORY_CARE_PROVIDER_SITE_OTHER): Payer: Medicare HMO

## 2021-12-31 DIAGNOSIS — H2512 Age-related nuclear cataract, left eye: Secondary | ICD-10-CM | POA: Diagnosis not present

## 2021-12-31 DIAGNOSIS — D509 Iron deficiency anemia, unspecified: Secondary | ICD-10-CM

## 2021-12-31 DIAGNOSIS — E119 Type 2 diabetes mellitus without complications: Secondary | ICD-10-CM | POA: Diagnosis not present

## 2021-12-31 DIAGNOSIS — H269 Unspecified cataract: Secondary | ICD-10-CM | POA: Diagnosis not present

## 2021-12-31 LAB — IRON: Iron: 153 ug/dL (ref 42–165)

## 2021-12-31 LAB — FERRITIN: Ferritin: 14.4 ng/mL — ABNORMAL LOW (ref 22.0–322.0)

## 2021-12-31 LAB — HEMOGLOBIN A1C: Hgb A1c MFr Bld: 6.8 % — ABNORMAL HIGH (ref 4.6–6.5)

## 2022-01-17 ENCOUNTER — Other Ambulatory Visit: Payer: Self-pay | Admitting: Family Medicine

## 2022-01-27 ENCOUNTER — Encounter: Payer: Self-pay | Admitting: Primary Care

## 2022-02-10 ENCOUNTER — Ambulatory Visit: Payer: Medicare HMO

## 2022-02-10 DIAGNOSIS — R0683 Snoring: Secondary | ICD-10-CM

## 2022-02-10 DIAGNOSIS — G4733 Obstructive sleep apnea (adult) (pediatric): Secondary | ICD-10-CM | POA: Diagnosis not present

## 2022-02-12 DIAGNOSIS — G4733 Obstructive sleep apnea (adult) (pediatric): Secondary | ICD-10-CM | POA: Diagnosis not present

## 2022-02-21 ENCOUNTER — Other Ambulatory Visit: Payer: Self-pay | Admitting: Family Medicine

## 2022-02-24 ENCOUNTER — Telehealth: Payer: Self-pay | Admitting: Primary Care

## 2022-02-24 ENCOUNTER — Ambulatory Visit: Payer: Medicare HMO | Admitting: Primary Care

## 2022-02-24 ENCOUNTER — Encounter: Payer: Self-pay | Admitting: Primary Care

## 2022-02-24 VITALS — BP 120/68 | HR 58 | Temp 98.5°F | Ht 72.0 in | Wt 225.4 lb

## 2022-02-24 DIAGNOSIS — G4730 Sleep apnea, unspecified: Secondary | ICD-10-CM | POA: Insufficient documentation

## 2022-02-24 DIAGNOSIS — R0683 Snoring: Secondary | ICD-10-CM

## 2022-02-24 NOTE — Telephone Encounter (Addendum)
New CPAP start. [employer] insurance will change in January, he wants to know if it would be better to place order now or wait

## 2022-02-24 NOTE — Progress Notes (Signed)
$'@Patient'v$  ID: [name] [name], male    DOB: 10/02/1950, 71 y.o.   MRN: [id number]  Chief Complaint  Patient presents with   Follow-up    Discuss home sleep study.  Having difficult time going to sleep and snoring during night.    Referring provider: Laurey Morale, MD  HPI: 71 year old male, former smoker.  Past medical history significant for A-fib, hypertension, type 2 diabetes, GERD, hyperlipidemia, iron deficiency anemia, osteopenia.  Previous [facility] pulmonary encounter:  10/28/2021 Patient presents today for sleep consult. He has symptoms of loud snoring, insomnia, restless sleep and daytime sleepiness. Newly diagnosed with diabetes and iron deficiency anemia, states that he has been sleeping better since being on medication. He has a sleep number bed and score has also improved. He has history of afib, atenolol will make him tired. He can tell when he goes into arrhythmia which tends to happen twice a year, he has flecainide available prn. Typical bedtime is around 11:30 pm, it can take several hours to fall asleep. He wakes up between 3-4 times a night to use restroom. He starts his day between 7-8am. No previous sleep studies. Epworth score 14. No symptoms of narcolepsy or cataplexy.  Sleep questionnaire Symptoms- Snoring, restless sleep and daytime fatigue  Prior sleep study- None Bedtime- 11:30PM Time to fall asleep- varies; several hours at times Nocturnal awakenings- 3-4 times Out of bed/start of day- 7-8am Weight changes- fluctuates 10-15 lbs  Do you operate heavy machinery- No Do you currently wear CPAP- No Do you current wear oxygen- No Epworth- 14   02/24/2022 - interim hx  Patient presents today to review sleep study results. He has symptoms of snoring, restless sleep and daytime sleepiness. HST on 02/10/22 showed severe OSA, AHI 63.1/hr with SpO2 low 77% (average 92%). We reviewed results, risk of untreated sleep apnea and treatment options. Due to cardiac history  recommending patient be started on auto CPAP, he is in agreement with plan. He is getting new insurance in January.     Allergies  Allergen Reactions   Oxycodone Nausea And Vomiting   Paroxetine Nausea And Vomiting and Other (See Comments)    Altered mental state   Sulfa Antibiotics Hives     There is no immunization history on file for this patient.  Past Medical History:  Diagnosis Date   Allergy    Anxiety    Arthritis    Atrial fibrillation ([place])    sees Dr. Lovena Le   Cataract    forming   Chronic kidney disease    kidney stone   Depression    Dizziness    or vertigo   Dysrhythmia    atrial fibrillation, once or twice a year   GERD (gastroesophageal reflux disease)    Headache    eyglass flicker in eye is migraine symptoms   History of kidney stones    HLD (hyperlipidemia)    HTN (hypertension)    Osteopenia    Retinal tear    Sinus bradycardia     Tobacco History: Social History   Tobacco Use  Smoking Status Former   Types: Cigarettes   Quit date: 03/11/1975   Years since quitting: 46.9  Smokeless Tobacco Never   Counseling given: Not Answered   Outpatient Medications Prior to Visit  Medication Sig Dispense Refill   aspirin EC 325 MG tablet Take 1 tablet (325 mg total) by mouth daily. 30 tablet 0   atenolol (TENORMIN) 25 MG tablet Take 1 tablet (25 mg total)  by mouth 3 (three) times daily. 270 tablet 3   atorvastatin (LIPITOR) 40 MG tablet Take 1 tablet (40 mg total) by mouth daily. 90 tablet 3   CALCIUM-MAGNESIUM-ZINC PO Take 1 tablet by mouth in the morning.     Cholecalciferol (VITAMIN D3 PO) Take 2,000 Units by mouth in the morning.     flecainide (TAMBOCOR) 100 MG tablet MAY TAKE TWO TABLETS BY MOUTH DAILY AS NEEDED FOR BREAKTHROUGH A-FIB 30 tablet 2   Fluocinolone Acetonide (DERMOTIC) 0.01 % OIL Place 1-2 drops in ear(s) 3 (three) times a week. 1-2 gtts to aa ears for psoriasis up to 3 days a week prn flares 20 mL 1   fluticasone (FLONASE) 50  MCG/ACT nasal spray Place 2 sprays into both nostrils in the morning.     Iron, Ferrous Sulfate, 325 (65 Fe) MG TABS Take 325 mg by mouth daily. 90 tablet 3   lisinopril (ZESTRIL) 5 MG tablet Take 1 tablet (5 mg total) by mouth daily. 90 tablet 3   meloxicam (MOBIC) 15 MG tablet Take 1 tablet (15 mg total) by mouth daily. 90 tablet 3   metFORMIN (GLUCOPHAGE) 500 MG tablet Take 1 tablet (500 mg total) by mouth 2 (two) times daily with a meal. 180 tablet 3   mometasone (ELOCON) 0.1 % lotion For psoriasis of the scalp apply to aa's QHS and wash out in the morning. Use up to 5d/wk PRN. Avoid f/g/a. 60 mL 11   sildenafil (REVATIO) 20 MG tablet TAKE 5 TABLETS BY MOUTH AS NEEDED 150 tablet 0   venlafaxine XR (EFFEXOR-XR) 37.5 MG 24 hr capsule Take 1 capsule (37.5 mg total) by mouth daily with breakfast. 90 capsule 3   No facility-administered medications prior to visit.    Review of Systems  Review of Systems  Constitutional:  Positive for fatigue.  HENT: Negative.    Respiratory:  Positive for apnea.   Cardiovascular: Negative.   Psychiatric/Behavioral:  Positive for sleep disturbance.     Physical Exam  BP 120/68 (BP Location: Left Arm, Patient Position: Sitting, Cuff Size: Normal)   Pulse (!) 58   Temp 98.5 F (36.9 C) (Oral)   Ht 6' (1.829 m)   Wt 225 lb 6.4 oz (102.2 kg)   SpO2 98%   BMI 30.57 kg/m  Physical Exam Constitutional:      Appearance: Normal appearance. He is not ill-appearing.  HENT:     Head: Normocephalic and atraumatic.  Cardiovascular:     Rate and Rhythm: Normal rate.  Pulmonary:     Effort: Pulmonary effort is normal.  Skin:    General: Skin is warm and dry.  Neurological:     General: No focal deficit present.     Mental Status: He is alert and oriented to person, place, and time. Mental status is at baseline.  Psychiatric:        Mood and Affect: Mood normal.        Behavior: Behavior normal.        Thought Content: Thought content normal.         Judgment: Judgment normal.      Lab Results:  CBC    Component Value Date/Time   WBC 6.7 09/26/2021 1236   RBC 4.87 09/26/2021 1236   HGB 11.4 (L) 09/26/2021 1236   HCT 36.2 (L) 09/26/2021 1236   PLT 187.0 09/26/2021 1236   MCV 74.3 (L) 09/26/2021 1236   MCH 24.6 (L) 05/14/2021 0510   MCHC 31.5 09/26/2021 1236  RDW 15.3 09/26/2021 1236   LYMPHSABS 1.5 09/26/2021 1236   MONOABS 0.7 09/26/2021 1236   EOSABS 0.3 09/26/2021 1236   BASOSABS 0.1 09/26/2021 1236    BMET    Component Value Date/Time   NA 139 09/26/2021 1236   K 4.5 09/26/2021 1236   CL 103 09/26/2021 1236   CO2 30 09/26/2021 1236   GLUCOSE 111 (H) 09/26/2021 1236   BUN 20 09/26/2021 1236   CREATININE 1.02 09/26/2021 1236   CREATININE 1.04 02/27/2020 1257   CALCIUM 8.7 09/26/2021 1236   GFRNONAA >60 05/14/2021 0510   GFRAA 45 (L) 01/27/2018 1748    BNP No results found for: "BNP"  ProBNP No results found for: "PROBNP"  Imaging: No results found.   Assessment & Plan:   Severe sleep apnea - Patient has symptoms of snoring, restless sleep and daytime sleepiness. Hx afib. HST 02/10/22 showed severe OSA, AHI 63.1/hour. Reviewed risks of untreated sleep apnea. Recommending patient be started on auto CPAP 5-20cm h20 with mask of choice. Advised patient aim to wear CPAP every night minimum 4-6 hours or longer. Encourage side sleeping position or elevating HOB 30 degrees. Cautioned against driving if experiencing excessive daytime sleepiness. FU 31-90 days after starting CPAP for compliance check.    Martyn Ehrich, NP 02/24/2022

## 2022-02-24 NOTE — Assessment & Plan Note (Addendum)
-   Patient has symptoms of snoring, restless sleep and daytime sleepiness. Hx afib. HST 02/10/22 showed severe OSA, AHI 63.1/hour. Reviewed risks of untreated sleep apnea. Recommending patient be started on auto CPAP 5-20cm h20 with mask of choice. Advised patient aim to wear CPAP every night minimum 4-6 hours or longer. Encourage side sleeping position or elevating HOB 30 degrees. Cautioned against driving if experiencing excessive daytime sleepiness. FU 31-90 days after starting CPAP for compliance check.

## 2022-02-24 NOTE — Patient Instructions (Signed)
Sleep study on 02/10/22 showed evidence of severe obstructive sleep apnea, you had one average 63.1 apneic/hypopneic events an hour  Due to severity of your sleep apnea and cardiac history recommending you be started on auto CPAP  Recommendations: - Once you receive CPAP aim to wear every night for minimum 4 to 6 hours or longer (insurance requires that you are 70% compliant with use greater than 4 hours)  - Maintain normal BMI range - Focus on side sleeping position or elevate head 30 degrees with wedge if sleeping on your back - Do not drive experiencing excessive daytime sleepiness fatigue  Follow-up: - 31-90 days after starting CPAP for compliance check  CPAP and BIPAP Information CPAP and BIPAP are methods that use air pressure to keep your airways open and to help you breathe well. CPAP and BIPAP use different amounts of pressure. Your health care provider will tell you whether CPAP or BIPAP would be more helpful for you. CPAP stands for "continuous positive airway pressure." With CPAP, the amount of pressure stays the same while you breathe in (inhale) and out (exhale). BIPAP stands for "bi-level positive airway pressure." With BIPAP, the amount of pressure will be higher when you inhale and lower when you exhale. This allows you to take larger breaths. CPAP or BIPAP may be used in the hospital, or your health care provider may want you to use it at home. You may need to have a sleep study before your health care provider can order a machine for you to use at home. What are the advantages? CPAP or BIPAP can be helpful if you have: Sleep apnea. Chronic obstructive pulmonary disease (COPD). Heart failure. Medical conditions that cause muscle weakness, including muscular dystrophy or amyotrophic lateral sclerosis (ALS). Other problems that cause breathing to be shallow, weak, abnormal, or difficult. CPAP and BIPAP are most commonly used for obstructive sleep apnea (OSA) to keep the  airways from collapsing when the muscles relax during sleep. What are the risks? Generally, this is a safe treatment. However, problems may occur, including: Irritated skin or skin sores if the mask does not fit properly. Dry or stuffy nose or nosebleeds. Dry mouth. Feeling gassy or bloated. Sinus or lung infection if the equipment is not cleaned properly. When should CPAP or BIPAP be used? In most cases, the mask only needs to be worn during sleep. Generally, the mask needs to be worn throughout the night and during any daytime naps. People with certain medical conditions may also need to wear the mask at other times, such as when they are awake. Follow instructions from your health care provider about when to use the machine. What happens during CPAP or BIPAP?  Both CPAP and BIPAP are provided by a small machine with a flexible plastic tube that attaches to a plastic mask that you wear. Air is blown through the mask into your nose or mouth. The amount of pressure that is used to blow the air can be adjusted on the machine. Your health care provider will set the pressure setting and help you find the best mask for you. Tips for using the mask Because the mask needs to be snug, some people feel trapped or closed-in (claustrophobic) when first using the mask. If you feel this way, you may need to get used to the mask. One way to do this is to hold the mask loosely over your nose or mouth and then gradually apply the mask more snugly. You can also gradually increase  the amount of time that you use the mask. Masks are available in various types and sizes. If your mask does not fit well, talk with your health care provider about getting a different one. Some common types of masks include: Full face masks, which fit over the mouth and nose. Nasal masks, which fit over the nose. Nasal pillow or prong masks, which fit into the nostrils. If you are using a mask that fits over your nose and you tend to  breathe through your mouth, a chin strap may be applied to help keep your mouth closed. Use a skin barrier to protect your skin as told by your health care provider. Some CPAP and BIPAP machines have alarms that may sound if the mask comes off or develops a leak. If you have trouble with the mask, it is very important that you talk with your health care provider about finding a way to make the mask easier to tolerate. Do not stop using the mask. There could be a negative impact on your health if you stop using the mask. Tips for using the machine Place your CPAP or BIPAP machine on a secure table or stand near an electrical outlet. Know where the on/off switch is on the machine. Follow instructions from your health care provider about how to set the pressure on your machine and when you should use it. Do not eat or drink while the CPAP or BIPAP machine is on. Food or fluids could get pushed into your lungs by the pressure of the CPAP or BIPAP. For home use, CPAP and BIPAP machines can be rented or purchased through home health care companies. Many different brands of machines are available. Renting a machine before purchasing may help you find out which particular machine works well for you. Your health insurance company may also decide which machine you may get. Keep the CPAP or BIPAP machine and attachments clean. Ask your health care provider for specific instructions. Check the humidifier if you have a dry stuffy nose or nosebleeds. Make sure it is working correctly. Follow these instructions at home: Take over-the-counter and prescription medicines only as told by your health care provider. Ask if you can take sinus medicine if your sinuses are blocked. Do not use any products that contain nicotine or tobacco. These products include cigarettes, chewing tobacco, and vaping devices, such as e-cigarettes. If you need help quitting, ask your health care provider. Keep all follow-up visits. This is  important. Contact a health care provider if: You have redness or pressure sores on your head, face, mouth, or nose from the mask or head gear. You have trouble using the CPAP or BIPAP machine. You cannot tolerate wearing the CPAP or BIPAP mask. Someone tells you that you snore even when wearing your CPAP or BIPAP. Get help right away if: You have trouble breathing. You feel confused. Summary CPAP and BIPAP are methods that use air pressure to keep your airways open and to help you breathe well. If you have trouble with the mask, it is very important that you talk with your health care provider about finding a way to make the mask easier to tolerate. Do not stop using the mask. There could be a negative impact to your health if you stop using the mask. Follow instructions from your health care provider about when to use the machine. This information is not intended to replace advice given to you by your health care provider. Make sure you discuss any questions  you have with your health care provider. Document Revised: 10/03/2020 Document Reviewed: 02/03/2020 Elsevier Patient Education  [place].

## 2022-02-25 NOTE — Telephone Encounter (Signed)
I can't advise that not sure what the best thing to do

## 2022-03-02 NOTE — Progress Notes (Signed)
Reviewed and agree with assessment/plan.   Chesley Mires, MD [facility] Pulmonary/Critical Care 03/02/2022, 7:18 PM Pager:  [phone]

## 2022-03-10 DIAGNOSIS — R69 Illness, unspecified: Secondary | ICD-10-CM | POA: Diagnosis not present

## 2022-03-14 DIAGNOSIS — G4733 Obstructive sleep apnea (adult) (pediatric): Secondary | ICD-10-CM | POA: Diagnosis not present

## 2022-03-17 DIAGNOSIS — Z0100 Encounter for examination of eyes and vision without abnormal findings: Secondary | ICD-10-CM | POA: Diagnosis not present

## 2022-03-27 DIAGNOSIS — G4733 Obstructive sleep apnea (adult) (pediatric): Secondary | ICD-10-CM | POA: Diagnosis not present

## 2022-04-14 DIAGNOSIS — G4733 Obstructive sleep apnea (adult) (pediatric): Secondary | ICD-10-CM | POA: Diagnosis not present

## 2022-04-20 DIAGNOSIS — E119 Type 2 diabetes mellitus without complications: Secondary | ICD-10-CM | POA: Diagnosis not present

## 2022-04-20 DIAGNOSIS — J40 Bronchitis, not specified as acute or chronic: Secondary | ICD-10-CM | POA: Diagnosis not present

## 2022-04-20 DIAGNOSIS — I4891 Unspecified atrial fibrillation: Secondary | ICD-10-CM | POA: Diagnosis not present

## 2022-04-26 DIAGNOSIS — R69 Illness, unspecified: Secondary | ICD-10-CM | POA: Diagnosis not present

## 2022-04-27 DIAGNOSIS — G4733 Obstructive sleep apnea (adult) (pediatric): Secondary | ICD-10-CM | POA: Diagnosis not present

## 2022-04-28 ENCOUNTER — Ambulatory Visit (INDEPENDENT_AMBULATORY_CARE_PROVIDER_SITE_OTHER): Payer: Medicare HMO | Admitting: Family Medicine

## 2022-04-28 ENCOUNTER — Encounter: Payer: Self-pay | Admitting: Family Medicine

## 2022-04-28 VITALS — BP 118/74 | HR 56 | Temp 97.7°F | Wt 216.0 lb

## 2022-04-28 DIAGNOSIS — I10 Essential (primary) hypertension: Secondary | ICD-10-CM | POA: Diagnosis not present

## 2022-04-28 DIAGNOSIS — D509 Iron deficiency anemia, unspecified: Secondary | ICD-10-CM

## 2022-04-28 DIAGNOSIS — E119 Type 2 diabetes mellitus without complications: Secondary | ICD-10-CM | POA: Diagnosis not present

## 2022-04-28 DIAGNOSIS — K219 Gastro-esophageal reflux disease without esophagitis: Secondary | ICD-10-CM | POA: Diagnosis not present

## 2022-04-28 LAB — CBC WITH DIFFERENTIAL/PLATELET
Basophils Absolute: 0 10*3/uL (ref 0.0–0.1)
Basophils Relative: 0.6 % (ref 0.0–3.0)
Eosinophils Absolute: 0.4 10*3/uL (ref 0.0–0.7)
Eosinophils Relative: 5.7 % — ABNORMAL HIGH (ref 0.0–5.0)
HCT: 44.2 % (ref 39.0–52.0)
Hemoglobin: 14.8 g/dL (ref 13.0–17.0)
Lymphocytes Relative: 20.4 % (ref 12.0–46.0)
Lymphs Abs: 1.5 10*3/uL (ref 0.7–4.0)
MCHC: 33.4 g/dL (ref 30.0–36.0)
MCV: 86.4 fl (ref 78.0–100.0)
Monocytes Absolute: 0.7 10*3/uL (ref 0.1–1.0)
Monocytes Relative: 9.8 % (ref 3.0–12.0)
Neutro Abs: 4.7 10*3/uL (ref 1.4–7.7)
Neutrophils Relative %: 63.5 % (ref 43.0–77.0)
Platelets: 238 10*3/uL (ref 150.0–400.0)
RBC: 5.12 Mil/uL (ref 4.22–5.81)
RDW: 14.4 % (ref 11.5–15.5)
WBC: 7.3 10*3/uL (ref 4.0–10.5)

## 2022-04-28 LAB — HEMOGLOBIN A1C: Hgb A1c MFr Bld: 6.7 % — ABNORMAL HIGH (ref 4.6–6.5)

## 2022-04-28 LAB — IBC + FERRITIN
Ferritin: 35.2 ng/mL (ref 22.0–322.0)
Iron: 138 ug/dL (ref 42–165)
Saturation Ratios: 43.6 % (ref 20.0–50.0)
TIBC: 316.4 ug/dL (ref 250.0–450.0)
Transferrin: 226 mg/dL (ref 212.0–360.0)

## 2022-04-28 MED ORDER — LINAGLIPTIN 5 MG PO TABS: 5.0000 mg | ORAL_TABLET | Freq: Every day | ORAL | 5 refills | Status: DC

## 2022-04-28 MED ORDER — OMEPRAZOLE 40 MG PO CPDR: 40.0000 mg | DELAYED_RELEASE_CAPSULE | Freq: Every day | ORAL | 5 refills | Status: DC

## 2022-04-28 NOTE — Progress Notes (Signed)
   Subjective:    Patient ID: [name] [name], male    DOB: 1950-09-23, 72 y.o.   MRN: [id number]  HPI Here for a number of issues. First he has been nauseated almost every day for the past month, though he has not vomited. He admits to frequent heartburn, and he takes TUMS fairly frequently. No trouble swallowing. He thinks this may be a side effect of the Metformin. His stools tend to be soft as well, though he does not have diarrhea. Second he has felt very tired for the past few months, and he wants to check his iron levels again. These were last check ed in October when his iron was 153 and his ferritin was 14.4. He continues to take one iron pill daily. He was seen in an urgent care clinic on 04-20-22 when he presented with a hard dry cough. He was diagnosed with a viral bronchitis, and he was treated with 5 days of Prednisone. He now feels much better though he still has an occasional cough. He has been using his CPAP machine at night the past 2 months, and this helps a lot. His snoring has stopped and he does not feel as sleepy during the day time. As far as his diabetes goes, his A1c last October was 6.8%.    Review of Systems  Constitutional: Negative.   HENT: Negative.    Eyes: Negative.   Respiratory:  Positive for cough. Negative for shortness of breath and wheezing.   Cardiovascular: Negative.   Gastrointestinal:  Positive for nausea. Negative for abdominal distention, abdominal pain, blood in stool, constipation, diarrhea and vomiting.       Objective:   Physical Exam Constitutional:      Appearance: Normal appearance.  HENT:     Right Ear: Tympanic membrane, ear canal and external ear normal.     Left Ear: Tympanic membrane, ear canal and external ear normal.     Nose: Nose normal.     Mouth/Throat:     Pharynx: Oropharynx is clear.  Eyes:     Conjunctiva/sclera: Conjunctivae normal.  Cardiovascular:     Rate and Rhythm: Normal rate and regular rhythm.     Pulses: Normal  pulses.     Heart sounds: Normal heart sounds.  Pulmonary:     Effort: Pulmonary effort is normal.     Breath sounds: Normal breath sounds.  Lymphadenopathy:     Cervical: No cervical adenopathy.  Neurological:     Mental Status: He is alert.           Assessment & Plan:  His nausea is likely to be an effect of GERD, so he will start taking Omeprazole 40 mg every morning. For the anemia, we will check CBC, iron and ferritin today. Fot he diabetes, we will check an A1c today. He will stop the Metformin and start taking Tradjenta 5 mg daily. He seems to be recovering as expected from a recent viral URI.  Alysia Penna, MD

## 2022-05-13 DIAGNOSIS — G4733 Obstructive sleep apnea (adult) (pediatric): Secondary | ICD-10-CM | POA: Diagnosis not present

## 2022-05-19 DIAGNOSIS — H18602 Keratoconus, unspecified, left eye: Secondary | ICD-10-CM | POA: Diagnosis not present

## 2022-05-19 DIAGNOSIS — H538 Other visual disturbances: Secondary | ICD-10-CM | POA: Diagnosis not present

## 2022-05-19 DIAGNOSIS — H18601 Keratoconus, unspecified, right eye: Secondary | ICD-10-CM | POA: Diagnosis not present

## 2022-05-26 DIAGNOSIS — G4733 Obstructive sleep apnea (adult) (pediatric): Secondary | ICD-10-CM | POA: Diagnosis not present

## 2022-06-05 ENCOUNTER — Other Ambulatory Visit: Payer: Self-pay

## 2022-06-05 ENCOUNTER — Telehealth: Payer: Self-pay | Admitting: Family Medicine

## 2022-06-05 DIAGNOSIS — D509 Iron deficiency anemia, unspecified: Secondary | ICD-10-CM

## 2022-06-05 DIAGNOSIS — H18603 Keratoconus, unspecified, bilateral: Secondary | ICD-10-CM | POA: Diagnosis not present

## 2022-06-05 NOTE — Telephone Encounter (Signed)
Labs placed on Epic

## 2022-06-05 NOTE — Telephone Encounter (Signed)
Pt called to confirm that he will be here on 06/10/22 for his Lab appointment.

## 2022-06-10 ENCOUNTER — Other Ambulatory Visit (INDEPENDENT_AMBULATORY_CARE_PROVIDER_SITE_OTHER): Payer: Medicare HMO

## 2022-06-10 DIAGNOSIS — D509 Iron deficiency anemia, unspecified: Secondary | ICD-10-CM | POA: Diagnosis not present

## 2022-06-10 LAB — IBC + FERRITIN
Ferritin: 16.2 ng/mL — ABNORMAL LOW (ref 22.0–322.0)
Iron: 61 ug/dL (ref 42–165)
Saturation Ratios: 17.4 % — ABNORMAL LOW (ref 20.0–50.0)
TIBC: 350 ug/dL (ref 250.0–450.0)
Transferrin: 250 mg/dL (ref 212.0–360.0)

## 2022-06-11 MED ORDER — IRON (FERROUS SULFATE) 325 (65 FE) MG PO TABS: 325.0000 mg | ORAL_TABLET | Freq: Two times a day (BID) | ORAL | 3 refills | Status: DC

## 2022-06-11 NOTE — Addendum Note (Signed)
Addended by: Agnes Lawrence on: 06/11/2022 11:03 AM   Modules accepted: Orders

## 2022-06-13 DIAGNOSIS — G4733 Obstructive sleep apnea (adult) (pediatric): Secondary | ICD-10-CM | POA: Diagnosis not present

## 2022-06-17 ENCOUNTER — Telehealth: Payer: Self-pay | Admitting: Family Medicine

## 2022-06-17 NOTE — Telephone Encounter (Signed)
Contacted Janee Morn to schedule their annual wellness visit. Appointment made for 06/19/22.  Rudell Cobb AWV direct phone # [phone]

## 2022-06-17 NOTE — Telephone Encounter (Signed)
Called patient to schedule Medicare Annual Wellness Visit (AWV). Left message for patient to call back and schedule Medicare Annual Wellness Visit (AWV).  Last date of AWV: 05/22/21  Please schedule an appointment at any time with [facility] or [employer].  If any questions, please contact me at [phone].  Thank you ,  Rudell Cobb AWV direct phone # [phone]

## 2022-06-19 ENCOUNTER — Telehealth (INDEPENDENT_AMBULATORY_CARE_PROVIDER_SITE_OTHER): Payer: Medicare HMO | Admitting: Family Medicine

## 2022-06-19 VITALS — Ht 72.0 in | Wt 216.0 lb

## 2022-06-19 DIAGNOSIS — Z0000 Encounter for general adult medical examination without abnormal findings: Secondary | ICD-10-CM | POA: Diagnosis not present

## 2022-06-19 NOTE — Patient Instructions (Signed)
I really enjoyed getting to talk with you today! I am available on Tuesdays and Thursdays for virtual visits if you have any questions or concerns, or if I can be of any further assistance.   CHECKLIST FROM ANNUAL WELLNESS VISIT:  -Follow up (please call to schedule if not scheduled after visit):   -yearly for annual wellness visit with primary care office  Here is a list of your preventive care/health maintenance measures and the plan for each if any are due:  PLAN For any measures below that may be due:  -if you decide to do any of the vaccine you can do them at the pharmacy (or for the pneumonia and tetanus - can do those at the office if you prefer.) -can request hepatitis screening and diabetic foot exam the next time you see Dr. Clent Ridges if you wish  Health Maintenance  Topic Date Due   COVID-19 Vaccine (1) Never done   Hepatitis C Screening  Never done   DTaP/Tdap/Td (1 - Tdap) Never done   Zoster Vaccines- Shingrix (1 of 2) Never done   Diabetic kidney evaluation - Urine ACR  04/12/2015   Pneumonia Vaccine 59+ Years old (1 of 1 - PCV) Never done   FOOT EXAM  05/16/2021   OPHTHALMOLOGY EXAM  08/28/2022   Diabetic kidney evaluation - eGFR measurement  09/27/2022   INFLUENZA VACCINE  10/09/2022   HEMOGLOBIN A1C  10/27/2022   COLONOSCOPY (Pts 45-53yrs Insurance coverage will need to be confirmed)  01/05/2023   Medicare Annual Wellness (AWV)  06/19/2023   HPV VACCINES  Aged Out    -See a dentist at least yearly  -Get your eyes checked and then per your eye specialist's recommendations  -Other issues addressed today:   -I have included below further information regarding a healthy whole foods based diet, physical activity guidelines for adults, stress management and opportunities for social connections. I hope you find this information useful.    -----------------------------------------------------------------------------------------------------------------------------------------------------------------------------------------------------------------------------------------------------------  NUTRITION: -eat real food: lots of colorful vegetables (half the plate) and fruits -5-7 servings of vegetables and fruits per day (fresh or steamed is best), exp. 2 servings of vegetables with lunch and dinner and 2 servings of fruit per day. Berries and greens such as kale and collards are great choices.  -consume on a regular basis: whole grains (make sure first ingredient on label contains the word "whole"), fresh fruits, fish, nuts, seeds, healthy oils (such as olive oil, avocado oil, grape seed oil) -may eat small amounts of dairy and lean meat on occasion, but avoid processed meats such as ham, bacon, lunch meat, etc. -drink water -try to avoid fast food and pre-packaged foods, processed meat -most experts advise limiting sodium to < 2300mg  per day, should limit further is any chronic conditions such as high blood pressure, heart disease, diabetes, etc. The American Heart Association advised that < 1500mg  is is ideal -try to avoid foods that contain any ingredients with names you do not recognize  -try to avoid sugar/sweets (except for the natural sugar that occurs in fresh fruit) -try to avoid sweet drinks -try to avoid white rice, white bread, pasta (unless whole grain), white or yellow potatoes  EXERCISE GUIDELINES FOR ADULTS: -if you wish to increase your physical activity, do so gradually and with the approval of your doctor -STOP and seek medical care immediately if you have any chest pain, chest discomfort or trouble breathing when starting or increasing exercise  -move and stretch your body, legs, feet and arms  when sitting for long periods -Physical activity guidelines for optimal health in adults: -least 150 minutes per week of  aerobic exercise (can talk, but not sing) once approved by your doctor, 20-30 minutes of sustained activity or two 10 minute episodes of sustained activity every day.  -resistance training at least 2 days per week if approved by your doctor -balance exercises 3+ days per week:   Stand somewhere where you have something sturdy to hold onto if you lose balance.    1) lift up on toes, start with 5x per day and work up to 20x   2) stand and lift on leg straight out to the side so that foot is a few inches of the floor, start with 5x each side and work up to 20x each side   3) stand on one foot, start with 5 seconds each side and work up to 20 seconds on each side  If you need ideas or help with getting more active:  -Silver sneakers https://tools.silversneakers.com  -Walk with a Doc: http://www.duncan-williams.com/  -try to include resistance (weight lifting/strength building) and balance exercises twice per week: or the following link for ideas: http://castillo-powell.com/  BuyDucts.dk        ADVANCED HEALTHCARE DIRECTIVES:  Everyone should have advanced health care directives in place. This is so that you get the care you want, should you ever be in a situation where you are unable to make your own medical decisions.   From the [place] Advanced Directive Website: "Advance Health Care Directives are legal documents in which you give written instructions about your health care if, in the future, you cannot speak for yourself.   A health care power of attorney allows you to name a person you trust to make your health care decisions if you cannot make them yourself. A declaration of a desire for a natural death (or living will) is document, which states that you desire not to have your life prolonged by extraordinary measures if you have a terminal or incurable illness or if you are in a vegetative state. An  advance instruction for mental health treatment makes a declaration of instructions, information and preferences regarding your mental health treatment. It also states that you are aware that the advance instruction authorizes a mental health treatment provider to act according to your wishes. It may also outline your consent or refusal of mental health treatment. A declaration of an anatomical gift allows anyone over the age of 27 to make a gift by will, organ donor card or other document."   Please see the following website or an elder law attorney for forms, FAQs and for completion of advanced directives: [place] Health Care Directives Advance Health Care Directives (http://guzman.com/)  Or copy and paste the following to your web browser: PoshChat.fi

## 2022-06-19 NOTE — Progress Notes (Signed)
PATIENT CHECK-IN and HEALTH RISK ASSESSMENT QUESTIONNAIRE:  -completed by phone/video for upcoming Medicare Preventive Visit  Pre-Visit Check-in: 1)Vitals (height, wt, BP, etc) - record in vitals section for visit on day of visit 2)Review and Update Medications, Allergies PMH, Surgeries, Social history in Epic 3)Hospitalizations in the last year with date/reason? Last year 05/2021, kidney stone extract  4)Review and Update Care Team (patient's specialists) in Epic 5) Complete PHQ9 in Epic  6) Complete Fall Screening in Epic 7)Review all Health Maintenance Due and order under PCP if not done.  Medicare Wellness Patient Questionnaire:  Answer theses question about your habits: Do you drink alcohol? yes If yes, how many drinks do you have a day?rarely, beer once a month Have you ever smoked?Yes Quit date if applicable? 03/11/1975  How many packs a day do/did you smoke? Smokes 5-6 cigerettes a day Do you use smokeless tobacco?no Do you use an illicit drugs?no Do you exercises? Yes IF so, what type and how many days/minutes per week?walking 2-3 miles 4x per week, play golf, occationally weight lifting, 4 days of the wk and 45 mins.  Are you sexually active? Yes Number of partners?1  Typical breakfast: 2 eggs, toast Typical lunch: sandwich-ham and cheese, chip Typical dinner: a meat- chicken or beef with 2 vegetables. Hamburger once a week. Typical snacks: apple, sometimes cookie  Beverages: Water , sometimes soda,   Answer theses question about you: Can you perform most household chores?Yes  Do you find it hard to follow a conversation in a noisy room?no, don't hear good on R ear - plans to see ENT about this Do you often ask people to speak up or repeat themselves?yes occasionally due to R side Do you feel that you have a problem with memory?No Do you balance your checkbook and or bank acounts? have no problem doing it but wife does checkbook/bank account Do you feel safe at  home?yes Last dentist visit?about two months ago. Goes 2 times a year.  Do you need assistance with any of the following: Please note if so No  Driving?  Feeding yourself?  Getting from bed to chair?  Getting to the toilet?  Bathing or showering?  Dressing yourself?  Managing money?  Climbing a flight of stairs  Preparing meals?    Do you have Advanced Directives in place (Living Will, Healthcare Power or Attorney)? No in process for Living Will   Last eye Exam and location? 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 Had a doctor exam a wk go in [place] with Dr. Antoine Poche.    Do you currently use prescribed or non-prescribed narcotic or opioid pain medications?No  Do you have a history or close family history of breast, ovarian, tubal or peritoneal cancer or a family member with BRCA (breast cancer susceptibility 1 and 2) gene mutations? Brother passed away multiple myaloma, another brother had small carcoma from Kidney- successfully removed. Father had hodgekin's disease.  Nurse/Assistant Credentials/time stamp: Karpuih Moyun/CMA/3:32pm.   ----------------------------------------------------------------------------------------------------------------------------------------------------------------------------------------------------------------------    MEDICARE ANNUAL PREVENTIVE CARE VISIT WITH PROVIDER (Welcome to Medicare, initial annual wellness or annual wellness exam)  Virtual Visit via Video Note  I connected with PLATON AROCHO on 06/19/22  by a video enabled telemedicine application and verified that I am speaking with the correct person using two identifiers.  Location patient: home Location provider:work or home office Persons participating in the virtual visit: patient, provider  Concerns and/or follow up today:reports all is stable since recent visit with Dr. Clent Ridges. Reports is having  a colonoscopy and possibly EGD for iron def.    See HM section in Epic for other  details of completed HM.    ROS: negative for report of fevers, unintentional weight loss, hearing loss or change, chest pain, sob, hemoptysis, melena, hematochezia, hematuria (unchanged - followed bu nephrology q 6 months per pt for stones), falls, bleeding or bruising, thoughts of suicide or self harm, memory loss  Patient-completed extensive health risk assessment - reviewed and discussed with the patient: See Health Risk Assessment completed with patient prior to the visit either above or in recent phone note. This was reviewed in detailed with the patient today and appropriate recommendations, orders and referrals were placed as needed per Summary below and patient instructions.   Review of Medical History: -PMH, PSH, Family History and current specialty and care providers reviewed and updated and listed below   Patient Care Team: Nelwyn SalisburyFry, Stephen A, MD as PCP - General   Past Medical History:  Diagnosis Date   Allergy    Anxiety    Arthritis    Atrial fibrillation [facility](HCC)    sees Dr. Ladona Ridgelaylor   Cataract    forming   Chronic kidney disease    kidney stone   Depression    Dizziness    or vertigo   Dysrhythmia    atrial fibrillation, once or twice a year   GERD (gastroesophageal reflux disease)    Headache    eyglass flicker in eye is migraine symptoms   History of kidney stones    HLD (hyperlipidemia)    HTN (hypertension)    Osteopenia    Retinal tear    Sinus bradycardia     Past Surgical History:  Procedure Laterality Date   CARDIAC CATHETERIZATION  2003   normal   COLONOSCOPY  01/04/2018   per Dr. Russella DarStark, adenomatous  polyp, repeat in 5 years   CYSTOSCOPY/URETEROSCOPY/HOLMIUM LASER/STENT PLACEMENT Left 05/16/2021   Procedure: CYSTOSCOPY/URETEROSCOPY/HOLMIUM LASER/STENT PLACEMENT;  Surgeon: Riki AltesStoioff, Scott C, MD;  Location: [facility] ORS;  Service: Urology;  Laterality: Left;   EXTRACORPOREAL SHOCK WAVE LITHOTRIPSY Left 01/21/2018   Procedure: EXTRACORPOREAL SHOCK WAVE  LITHOTRIPSY (ESWL);  Surgeon: Vanna ScotlandBrandon, Ashley, MD;  Location: [facility] ORS;  Service: Urology;  Laterality: Left;   EYE SURGERY     retinal   POLYPECTOMY     RETINAL LASER PROCEDURE Right    repair retinal tear    WART REMOVAL     LEFT UPPER ARM   WISDOM TOOTH EXTRACTION      Social History   Socioeconomic History   Marital status: Married    Spouse name: nancy   Number of children: Not on file   Years of education: Not on file   Highest education level: Master's degree (e.g., MA, MS, MEng, MEd, MSW, MBA)  Occupational History   Occupation: Control and instrumentation engineerexec direct for [employer]    Comment: retired  Tobacco Use   Smoking status: Former    Types: Cigarettes    Quit date: 03/11/1975    Years since quitting: 47.3   Smokeless tobacco: Never  Vaping Use   Vaping Use: Never used  Substance and Sexual Activity   Alcohol use: Yes    Alcohol/week: 0.0 standard drinks of alcohol    Comment: couple times a month-SOCIALLY 1 BEER A MONTH   Drug use: No   Sexual activity: Yes    Birth control/protection: None  Other Topics Concern   Not on file  Social History Narrative   Occupation: Airline pilotaccountant.    Social  Determinants of Health   Financial Resource Strain: Low Risk  (10/01/2021)   Overall Financial Resource Strain (CARDIA)    Difficulty of Paying Living Expenses: Not hard at all  Food Insecurity: No Food Insecurity (10/01/2021)   Hunger Vital Sign    Worried About Running Out of Food in the Last Year: Never true    Ran Out of Food in the Last Year: Never true  Transportation Needs: No Transportation Needs (10/01/2021)   PRAPARE - Administrator, Civil Service (Medical): No    Lack of Transportation (Non-Medical): No  Physical Activity: Sufficiently Active (10/01/2021)   Exercise Vital Sign    Days of Exercise per Week: 4 days    Minutes of Exercise per Session: 40 min  Stress: Stress Concern Present (10/01/2021)   [employer] of Occupational Health - Occupational Stress  Questionnaire    Feeling of Stress : To some extent  Social Connections: Socially Integrated (10/01/2021)   Social Connection and Isolation Panel [NHANES]    Frequency of Communication with Friends and Family: More than three times a week    Frequency of Social Gatherings with Friends and Family: More than three times a week    Attends Religious Services: More than 4 times per year    Active Member of [employer] or Organizations: Yes    Attends Engineer, structural: More than 4 times per year    Marital Status: Married  Catering manager Violence: Not At Risk (05/22/2021)   Humiliation, Afraid, Rape, and Kick questionnaire    Fear of Current or Ex-Partner: No    Emotionally Abused: No    Physically Abused: No    Sexually Abused: No    Family History  Problem Relation Age of Onset   Hodgkin's lymphoma Father    Hyperlipidemia Other    Hypertension Mother    Stroke Mother    Heart attack Brother    Prostate cancer Brother        1st degree relative <50   Diabetes Brother        1st degree relative   Multiple myeloma Brother    Hypertension Brother    Heart disease Brother    Heart attack Brother    Colon polyps Brother    Colon cancer Neg Hx    Esophageal cancer Neg Hx    Rectal cancer Neg Hx    Stomach cancer Neg Hx     Current Outpatient Medications on File Prior to Visit  Medication Sig Dispense Refill   aspirin EC 325 MG tablet Take 1 tablet (325 mg total) by mouth daily. 30 tablet 0   atenolol (TENORMIN) 25 MG tablet Take 1 tablet (25 mg total) by mouth 3 (three) times daily. 270 tablet 3   CALCIUM-MAGNESIUM-ZINC PO Take 1 tablet by mouth in the morning.     Cholecalciferol (VITAMIN D3 PO) Take 2,000 Units by mouth in the morning.     flecainide (TAMBOCOR) 100 MG tablet MAY TAKE TWO TABLETS BY MOUTH DAILY AS NEEDED FOR BREAKTHROUGH A-FIB 30 tablet 2   Fluocinolone Acetonide (DERMOTIC) 0.01 % OIL Place 1-2 drops in ear(s) 3 (three) times a week. 1-2 gtts to aa ears  for psoriasis up to 3 days a week prn flares 20 mL 1   fluticasone (FLONASE) 50 MCG/ACT nasal spray Place 2 sprays into both nostrils in the morning.     Iron, Ferrous Sulfate, 325 (65 Fe) MG TABS Take 325 mg by mouth 2 (two) times daily. 180  tablet 3   lisinopril (ZESTRIL) 5 MG tablet Take 1 tablet (5 mg total) by mouth daily. 90 tablet 3   mometasone (ELOCON) 0.1 % lotion For psoriasis of the scalp apply to aa's QHS and wash out in the morning. Use up to 5d/wk PRN. Avoid f/g/a. 60 mL 11   omeprazole (PRILOSEC) 40 MG capsule Take 1 capsule (40 mg total) by mouth daily. 30 capsule 5   sildenafil (REVATIO) 20 MG tablet TAKE 5 TABLETS BY MOUTH AS NEEDED 150 tablet 0   venlafaxine XR (EFFEXOR-XR) 37.5 MG 24 hr capsule Take 1 capsule (37.5 mg total) by mouth daily with breakfast. 90 capsule 3   atorvastatin (LIPITOR) 40 MG tablet Take 1 tablet (40 mg total) by mouth daily. (Patient not taking: Reported on 06/19/2022) 90 tablet 3   No current facility-administered medications on file prior to visit.    Allergies  Allergen Reactions   Oxycodone Nausea And Vomiting   Paroxetine Nausea And Vomiting and Other (See Comments)    Altered mental state   Sulfa Antibiotics Hives       Physical Exam There were no vitals filed for this visit. Estimated body mass index is 29.29 kg/m as calculated from the following:   Height as of this encounter: 6' (1.829 m).   Weight as of this encounter: 216 lb (98 kg).  EKG (optional): deferred due to virtual visit  GENERAL: alert, oriented, no acute distress detected; full vision exam deferred due to pandemic and/or virtual encounter  HEENT: atraumatic, conjunttiva clear, no obvious abnormalities on inspection of external nose and ears  NECK: normal movements of the head and neck  LUNGS: on inspection no signs of respiratory distress, breathing rate appears normal, no obvious gross SOB, gasping or wheezing  CV: no obvious cyanosis  MS: moves all visible  extremities without noticeable abnormality  PSYCH/NEURO: pleasant and cooperative, no obvious depression or anxiety, speech and thought processing grossly intact, Cognitive function grossly intact  Flowsheet Row Video Visit from [facility]/19/2022 in [facility] [place]  PHQ-9 Total Score 0           06/19/2022    3:05 PM 04/28/2022    9:59 AM 10/02/2021    3:43 PM 09/26/2021   11:13 AM 05/22/2021    9:12 AM  Depression screen PHQ 2/9  Decreased Interest 0 1 0 0 0  Down, Depressed, Hopeless 0 1 0 0 0  PHQ - 2 Score 0 2 0 0 0  Altered sleeping 0 1 1 3    Tired, decreased energy 0 1 2 2    Change in appetite 0 1 0 0   Feeling bad or failure about yourself  0 1 0 0   Trouble concentrating 0 0 0    Moving slowly or fidgety/restless 0 0 0 0   Suicidal thoughts 0 0 0 0   PHQ-9 Score 0 6 3 5    Difficult doing work/chores Not difficult at all Not difficult at all Not difficult at all Somewhat difficult        05/22/2021    9:14 AM 09/26/2021   11:13 AM 10/02/2021    3:43 PM 04/28/2022    9:59 AM 06/19/2022    3:06 PM  Fall Risk  Falls in the past year? 0 0 0 0 0  Was there an injury with Fall? 0 0 0 0 0  Fall Risk Category Calculator 0 0 0 0 0  Fall Risk Category (Retired) Low Low Low    (  RETIRED) Patient Fall Risk Level Low fall risk Low fall risk Low fall risk    Patient at Risk for Falls Due to No Fall Risks No Fall Risks No Fall Risks No Fall Risks No Fall Risks  Fall risk Follow up  Falls evaluation completed Falls evaluation completed Falls evaluation completed Falls evaluation completed     SUMMARY AND PLAN:  Encounter for Medicare annual wellness exam    Discussed applicable health maintenance/preventive health measures and advised and referred or ordered per patient preferences:  Health Maintenance  Topic Date Due   COVID-19 Vaccine (1) discussed, declined for now, let him know could do at pharmacy if decides to get   Hepatitis C Screening  Never  done, considering, advised can request the next time he gets labs if wishes to do, advised to check with insurance first   DTaP/Tdap/Td (1 - Tdap) Never done, discussed, knows he can get at office or at the pharmacy   Zoster Vaccines- Shingrix (1 of 2) Never done, discussed, know he can get at pharmacy   Diabetic kidney evaluation - Urine ACR  04/12/2015, see nephrology   Pneumonia Vaccine 9+ Years old (1 of 1 - PCV) Never done, discussed, advised can get at pharmacy or office   FOOT EXAM  05/16/2021, advised can do with PCP or podiatry - he believes he had in the last year with podiatry   OPHTHALMOLOGY EXAM  08/28/2022   Diabetic kidney evaluation - eGFR measurement  09/27/2022   INFLUENZA VACCINE  10/09/2022   HEMOGLOBIN A1C  10/27/2022   COLONOSCOPY (Pts 45-68yrs Insurance coverage will need to be confirmed)  01/05/2023   Medicare Annual Wellness (AWV)  06/19/2023   HPV VACCINES  Aged [employer] and counseling on the following was provided based on the above review of health and a plan/checklist for the patient, along with additional information discussed, was provided for the patient in the patient instructions :  -in process of completing advanced directives -he plans to schedule eval for hearing issues -Advised and counseled on a healthy lifestyle - including the importance of a healthy diet, regular physical activity, social connections and stress management. -Reviewed patient's current diet. Advised and counseled on a whole foods based healthy diet. A summary of a healthy diet was provided in the Patient Instructions.  -reviewed patient's current physical activity level and discussed exercise guidelines for adults. Discussed  exercise guideline recommendations in a safe and healthy way.  -Advise yearly dental visits at minimum and regular eye exams -Advised and counseled on alcohol safe limits, risks  Follow up: see patient instructions   Patient Instructions  I really  enjoyed getting to talk with you today! I am available on Tuesdays and Thursdays for virtual visits if you have any questions or concerns, or if I can be of any further assistance.   CHECKLIST FROM ANNUAL WELLNESS VISIT:  -Follow up (please call to schedule if not scheduled after visit):   -yearly for annual wellness visit with primary care office  Here is a list of your preventive care/health maintenance measures and the plan for each if any are due:  PLAN For any measures below that may be due:  -if you decide to do any of the vaccine you can do them at the pharmacy (or for the pneumonia and tetanus - can do those at the office if you prefer.) -can request hepatitis screening and diabetic foot exam the next time you see Dr. Clent Ridges if you wish  Health Maintenance  Topic Date Due   COVID-19 Vaccine (1) Never done   Hepatitis C Screening  Never done   DTaP/Tdap/Td (1 - Tdap) Never done   Zoster Vaccines- Shingrix (1 of 2) Never done   Diabetic kidney evaluation - Urine ACR  04/12/2015   Pneumonia Vaccine 91+ Years old (1 of 1 - PCV) Never done   FOOT EXAM  05/16/2021   OPHTHALMOLOGY EXAM  08/28/2022   Diabetic kidney evaluation - eGFR measurement  09/27/2022   INFLUENZA VACCINE  10/09/2022   HEMOGLOBIN A1C  10/27/2022   COLONOSCOPY (Pts 45-61yrs Insurance coverage will need to be confirmed)  01/05/2023   Medicare Annual Wellness (AWV)  06/19/2023   HPV VACCINES  Aged Out    -See a dentist at least yearly  -Get your eyes checked and then per your eye specialist's recommendations  -Other issues addressed today:   -I have included below further information regarding a healthy whole foods based diet, physical activity guidelines for adults, stress management and opportunities for social connections. I hope you find this information useful.    -----------------------------------------------------------------------------------------------------------------------------------------------------------------------------------------------------------------------------------------------------------  NUTRITION: -eat real food: lots of colorful vegetables (half the plate) and fruits -5-7 servings of vegetables and fruits per day (fresh or steamed is best), exp. 2 servings of vegetables with lunch and dinner and 2 servings of fruit per day. Berries and greens such as kale and collards are great choices.  -consume on a regular basis: whole grains (make sure first ingredient on label contains the word "whole"), fresh fruits, fish, nuts, seeds, healthy oils (such as olive oil, avocado oil, grape seed oil) -may eat small amounts of dairy and lean meat on occasion, but avoid processed meats such as ham, bacon, lunch meat, etc. -drink water -try to avoid fast food and pre-packaged foods, processed meat -most experts advise limiting sodium to < 2300mg  per day, should limit further is any chronic conditions such as high blood pressure, heart disease, diabetes, etc. The American Heart Association advised that < 1500mg  is is ideal -try to avoid foods that contain any ingredients with names you do not recognize  -try to avoid sugar/sweets (except for the natural sugar that occurs in fresh fruit) -try to avoid sweet drinks -try to avoid white rice, white bread, pasta (unless whole grain), white or yellow potatoes  EXERCISE GUIDELINES FOR ADULTS: -if you wish to increase your physical activity, do so gradually and with the approval of your doctor -STOP and seek medical care immediately if you have any chest pain, chest discomfort or trouble breathing when starting or increasing exercise  -move and stretch your body, legs, feet and arms when sitting for long periods -Physical activity guidelines for optimal health in adults: -least 150 minutes per week of  aerobic exercise (can talk, but not sing) once approved by your doctor, 20-30 minutes of sustained activity or two 10 minute episodes of sustained activity every day.  -resistance training at least 2 days per week if approved by your doctor -balance exercises 3+ days per week:   Stand somewhere where you have something sturdy to hold onto if you lose balance.    1) lift up on toes, start with 5x per day and work up to 20x   2) stand and lift on leg straight out to the side so that foot is a few inches of the floor, start with 5x each side and work up to 20x each side   3) stand on one foot, start with 5 seconds  each side and work up to 20 seconds on each side  If you need ideas or help with getting more active:  -Silver sneakers https://tools.silversneakers.com  -Walk with a Doc: http://www.duncan-williams.com/  -try to include resistance (weight lifting/strength building) and balance exercises twice per week: or the following link for ideas: http://castillo-powell.com/  BuyDucts.dk        ADVANCED HEALTHCARE DIRECTIVES:  Everyone should have advanced health care directives in place. This is so that you get the care you want, should you ever be in a situation where you are unable to make your own medical decisions.   From the [place] Advanced Directive Website: "Advance Health Care Directives are legal documents in which you give written instructions about your health care if, in the future, you cannot speak for yourself.   A health care power of attorney allows you to name a person you trust to make your health care decisions if you cannot make them yourself. A declaration of a desire for a natural death (or living will) is document, which states that you desire not to have your life prolonged by extraordinary measures if you have a terminal or incurable illness or if you are in a vegetative state. An  advance instruction for mental health treatment makes a declaration of instructions, information and preferences regarding your mental health treatment. It also states that you are aware that the advance instruction authorizes a mental health treatment provider to act according to your wishes. It may also outline your consent or refusal of mental health treatment. A declaration of an anatomical gift allows anyone over the age of 29 to make a gift by will, organ donor card or other document."   Please see the following website or an elder law attorney for forms, FAQs and for completion of advanced directives: [place] TEFL teacher Health Care Directives Advance Health Care Directives (http://guzman.com/)  Or copy and paste the following to your web browser: PoshChat.fi    Terressa Koyanagi, DO

## 2022-06-25 DIAGNOSIS — G4733 Obstructive sleep apnea (adult) (pediatric): Secondary | ICD-10-CM | POA: Diagnosis not present

## 2022-06-30 ENCOUNTER — Other Ambulatory Visit: Payer: Self-pay | Admitting: Family Medicine

## 2022-07-08 ENCOUNTER — Other Ambulatory Visit: Payer: Self-pay | Admitting: Family Medicine

## 2022-07-13 DIAGNOSIS — G4733 Obstructive sleep apnea (adult) (pediatric): Secondary | ICD-10-CM | POA: Diagnosis not present

## 2022-07-25 DIAGNOSIS — G4733 Obstructive sleep apnea (adult) (pediatric): Secondary | ICD-10-CM | POA: Diagnosis not present

## 2022-07-28 DIAGNOSIS — Z961 Presence of intraocular lens: Secondary | ICD-10-CM | POA: Diagnosis not present

## 2022-07-28 DIAGNOSIS — H35371 Puckering of macula, right eye: Secondary | ICD-10-CM | POA: Diagnosis not present

## 2022-07-28 DIAGNOSIS — H02834 Dermatochalasis of left upper eyelid: Secondary | ICD-10-CM | POA: Diagnosis not present

## 2022-07-28 DIAGNOSIS — H18413 Arcus senilis, bilateral: Secondary | ICD-10-CM | POA: Diagnosis not present

## 2022-08-05 ENCOUNTER — Telehealth: Payer: Self-pay | Admitting: Primary Care

## 2022-08-05 NOTE — Telephone Encounter (Signed)
PT way over due to Cpap FU. Can we sched him w/a different NP to be seen sooner. First avail for Ms. [name] [name] was late June. Please fwd to front desk for resched if we can. TY.

## 2022-08-06 NOTE — Telephone Encounter (Signed)
ATC X2 LVM for wife. Please advise [name] [name] has opening tomorrow at 10:30am. This is first available apt

## 2022-08-06 NOTE — Telephone Encounter (Signed)
Let PT know about appt change.

## 2022-08-07 ENCOUNTER — Ambulatory Visit: Payer: Medicare HMO | Admitting: Primary Care

## 2022-08-07 ENCOUNTER — Encounter: Payer: Self-pay | Admitting: Primary Care

## 2022-08-07 VITALS — BP 118/68 | HR 51 | Temp 97.5°F | Ht 72.0 in | Wt 221.8 lb

## 2022-08-07 DIAGNOSIS — G4733 Obstructive sleep apnea (adult) (pediatric): Secondary | ICD-10-CM

## 2022-08-07 DIAGNOSIS — G4730 Sleep apnea, unspecified: Secondary | ICD-10-CM | POA: Diagnosis not present

## 2022-08-07 NOTE — Patient Instructions (Addendum)
- Great compliance with CPAP - Sleep apnea is well-controlled on current pressure settings - We will lower max pressure from 20cm h20 to 15cm h20 / goal keep apnea events <5  - Continue to wear CPAP nightly for 4 to 6 hours or longer - Caution with alcohol or sedatives prior to bedtime as these can worsen underlying sleep apnea - Advised against driving if experiencing excessive daytime sleepiness fatigue - Change mask and filter monthly, tubing every 3 months and replace water chamber every 6 months   Orders: - Adjust CPAP pressure 5-15cm h20 (please place order) - Send CPAP supplies upon patient request only/ discontinue auto renewal   Follow-up - 1 year with Beth NP for CPAP compliance   CPAP and BIPAP Information CPAP and BIPAP are methods that use air pressure to keep your airways open and to help you breathe well. CPAP and BIPAP use different amounts of pressure. Your health care provider will tell you whether CPAP or BIPAP would be more helpful for you. CPAP stands for "continuous positive airway pressure." With CPAP, the amount of pressure stays the same while you breathe in (inhale) and out (exhale). BIPAP stands for "bi-level positive airway pressure." With BIPAP, the amount of pressure will be higher when you inhale and lower when you exhale. This allows you to take larger breaths. CPAP or BIPAP may be used in the hospital, or your health care provider may want you to use it at home. You may need to have a sleep study before your health care provider can order a machine for you to use at home. What are the advantages? CPAP or BIPAP can be helpful if you have: Sleep apnea. Chronic obstructive pulmonary disease (COPD). Heart failure. Medical conditions that cause muscle weakness, including muscular dystrophy or amyotrophic lateral sclerosis (ALS). Other problems that cause breathing to be shallow, weak, abnormal, or difficult. CPAP and BIPAP are most commonly used for obstructive  sleep apnea (OSA) to keep the airways from collapsing when the muscles relax during sleep. What are the risks? Generally, this is a safe treatment. However, problems may occur, including: Irritated skin or skin sores if the mask does not fit properly. Dry or stuffy nose or nosebleeds. Dry mouth. Feeling gassy or bloated. Sinus or lung infection if the equipment is not cleaned properly. When should CPAP or BIPAP be used? In most cases, the mask only needs to be worn during sleep. Generally, the mask needs to be worn throughout the night and during any daytime naps. People with certain medical conditions may also need to wear the mask at other times, such as when they are awake. Follow instructions from your health care provider about when to use the machine. What happens during CPAP or BIPAP?  Both CPAP and BIPAP are provided by a small machine with a flexible plastic tube that attaches to a plastic mask that you wear. Air is blown through the mask into your nose or mouth. The amount of pressure that is used to blow the air can be adjusted on the machine. Your health care provider will set the pressure setting and help you find the best mask for you. Tips for using the mask Because the mask needs to be snug, some people feel trapped or closed-in (claustrophobic) when first using the mask. If you feel this way, you may need to get used to the mask. One way to do this is to hold the mask loosely over your nose or mouth and then gradually  apply the mask more snugly. You can also gradually increase the amount of time that you use the mask. Masks are available in various types and sizes. If your mask does not fit well, talk with your health care provider about getting a different one. Some common types of masks include: Full face masks, which fit over the mouth and nose. Nasal masks, which fit over the nose. Nasal pillow or prong masks, which fit into the nostrils. If you are using a mask that fits over  your nose and you tend to breathe through your mouth, a chin strap may be applied to help keep your mouth closed. Use a skin barrier to protect your skin as told by your health care provider. Some CPAP and BIPAP machines have alarms that may sound if the mask comes off or develops a leak. If you have trouble with the mask, it is very important that you talk with your health care provider about finding a way to make the mask easier to tolerate. Do not stop using the mask. There could be a negative impact on your health if you stop using the mask. Tips for using the machine Place your CPAP or BIPAP machine on a secure table or stand near an electrical outlet. Know where the on/off switch is on the machine. Follow instructions from your health care provider about how to set the pressure on your machine and when you should use it. Do not eat or drink while the CPAP or BIPAP machine is on. Food or fluids could get pushed into your lungs by the pressure of the CPAP or BIPAP. For home use, CPAP and BIPAP machines can be rented or purchased through home health care companies. Many different brands of machines are available. Renting a machine before purchasing may help you find out which particular machine works well for you. Your health insurance company may also decide which machine you may get. Keep the CPAP or BIPAP machine and attachments clean. Ask your health care provider for specific instructions. Check the humidifier if you have a dry stuffy nose or nosebleeds. Make sure it is working correctly. Follow these instructions at home: Take over-the-counter and prescription medicines only as told by your health care provider. Ask if you can take sinus medicine if your sinuses are blocked. Do not use any products that contain nicotine or tobacco. These products include cigarettes, chewing tobacco, and vaping devices, such as e-cigarettes. If you need help quitting, ask your health care provider. Keep all  follow-up visits. This is important. Contact a health care provider if: You have redness or pressure sores on your head, face, mouth, or nose from the mask or head gear. You have trouble using the CPAP or BIPAP machine. You cannot tolerate wearing the CPAP or BIPAP mask. Someone tells you that you snore even when wearing your CPAP or BIPAP. Get help right away if: You have trouble breathing. You feel confused. Summary CPAP and BIPAP are methods that use air pressure to keep your airways open and to help you breathe well. If you have trouble with the mask, it is very important that you talk with your health care provider about finding a way to make the mask easier to tolerate. Do not stop using the mask. There could be a negative impact to your health if you stop using the mask. Follow instructions from your health care provider about when to use the machine. This information is not intended to replace advice given to you by  your health care provider. Make sure you discuss any questions you have with your health care provider. Document Revised: 10/03/2020 Document Reviewed: 02/03/2020 Elsevier Patient Education  2023 [employer].

## 2022-08-07 NOTE — Progress Notes (Signed)
@Patient  ID: [name] [name], male    DOB: 1950-05-11, 72 y.o.   MRN: [id number]  Chief Complaint  Patient presents with   Follow-up    Referring provider: Nelwyn Salisbury, MD  HPI: 72 year old male, former smoker.  Past medical history significant for A-fib, hypertension, type 2 diabetes, GERD, hyperlipidemia, iron deficiency anemia, osteopenia.  Previous [facility] pulmonary encounter:  10/28/2021 Patient presents today for sleep consult. He has symptoms of loud snoring, insomnia, restless sleep and daytime sleepiness. Newly diagnosed with diabetes and iron deficiency anemia, states that he has been sleeping better since being on medication. He has a sleep number bed and score has also improved. He has history of afib, atenolol will make him tired. He can tell when he goes into arrhythmia which tends to happen twice a year, he has flecainide available prn. Typical bedtime is around 11:30 pm, it can take several hours to fall asleep. He wakes up between 3-4 times a night to use restroom. He starts his day between 7-8am. No previous sleep studies. Epworth score 14. No symptoms of narcolepsy or cataplexy.  Sleep questionnaire Symptoms- Snoring, restless sleep and daytime fatigue  Prior sleep study- None Bedtime- 11:30PM Time to fall asleep- varies; several hours at times Nocturnal awakenings- 3-4 times Out of bed/start of day- 7-8am Weight changes- fluctuates 10-15 lbs  Do you operate heavy machinery- No Do you currently wear CPAP- No Do you current wear oxygen- No Epworth- 14   02/24/2022 Patient presents today to review sleep study results. He has symptoms of snoring, restless sleep and daytime sleepiness. HST on 02/10/22 showed severe OSA, AHI 63.1/hr with SpO2 low 77% (average 92%). We reviewed results, risk of untreated sleep apnea and treatment options. Due to cardiac history recommending patient be started on auto CPAP, he is in agreement with plan. He is getting new insurance in January.    08/07/2022- Interim hx Patient presents today for 6 month follow-up. HST on 02/10/22 showed severe OSA, AHI 63.1/hr with SpO2 low 77% (average 92%). Started on CPAP in January 2024. He is doing well. No issues with mask fit.  He adjusted ramp to 45 mins. At times feels pressure can be too strong at night. Daytime sleepiness and fatigue have improved.   [facility] download [phone] - 08/05/2022 Usage days 30/30 days (100%); 23 days (77%) greater than 4 hours Average usage 5 hours 39 minutes Pressure 5 to 20 cm H2O (10.2 cm H2O-95%) Air leaks 14.5 L/min (95%) AHI 1.5  Allergies  Allergen Reactions   Oxycodone Nausea And Vomiting   Paroxetine Nausea And Vomiting and Other (See Comments)    Altered mental state   Sulfa Antibiotics Hives     There is no immunization history on file for this patient.  Past Medical History:  Diagnosis Date   Allergy    Anxiety    Arthritis    Atrial fibrillation [facility])    sees Dr. Ladona Ridgel   Cataract    forming   Chronic kidney disease    kidney stone   Depression    Dizziness    or vertigo   Dysrhythmia    atrial fibrillation, once or twice a year   GERD (gastroesophageal reflux disease)    Headache    eyglass flicker in eye is migraine symptoms   History of kidney stones    HLD (hyperlipidemia)    HTN (hypertension)    Osteopenia    Retinal tear    Sinus bradycardia     Tobacco  History: Social History   Tobacco Use  Smoking Status Former   Types: Cigarettes   Quit date: 03/11/1975   Years since quitting: 47.4  Smokeless Tobacco Never   Counseling given: Not Answered   Outpatient Medications Prior to Visit  Medication Sig Dispense Refill   aspirin EC 325 MG tablet Take 1 tablet (325 mg total) by mouth daily. 30 tablet 0   atenolol (TENORMIN) 25 MG tablet Take 1 tablet (25 mg total) by mouth 3 (three) times daily. 270 tablet 3   atorvastatin (LIPITOR) 40 MG tablet Take 1 tablet by mouth once daily 90 tablet 0    CALCIUM-MAGNESIUM-ZINC PO Take 1 tablet by mouth in the morning.     Cholecalciferol (VITAMIN D3 PO) Take 2,000 Units by mouth in the morning.     flecainide (TAMBOCOR) 100 MG tablet MAY TAKE TWO TABLETS BY MOUTH DAILY AS NEEDED FOR BREAKTHROUGH A-FIB 30 tablet 2   Fluocinolone Acetonide (DERMOTIC) 0.01 % OIL Place 1-2 drops in ear(s) 3 (three) times a week. 1-2 gtts to aa ears for psoriasis up to 3 days a week prn flares 20 mL 1   fluticasone (FLONASE) 50 MCG/ACT nasal spray Place 2 sprays into both nostrils in the morning.     Iron, Ferrous Sulfate, 325 (65 Fe) MG TABS Take 325 mg by mouth 2 (two) times daily. 180 tablet 3   lisinopril (ZESTRIL) 5 MG tablet Take 1 tablet by mouth once daily 90 tablet 0   mometasone (ELOCON) 0.1 % lotion For psoriasis of the scalp apply to aa's QHS and wash out in the morning. Use up to 5d/wk PRN. Avoid f/g/a. 60 mL 11   omeprazole (PRILOSEC) 40 MG capsule Take 1 capsule (40 mg total) by mouth daily. 30 capsule 5   sildenafil (REVATIO) 20 MG tablet TAKE 5 TABLETS BY MOUTH AS NEEDED 150 tablet 0   venlafaxine XR (EFFEXOR-XR) 37.5 MG 24 hr capsule Take 1 capsule (37.5 mg total) by mouth daily with breakfast. 90 capsule 3   No facility-administered medications prior to visit.    Review of Systems  Review of Systems  Constitutional: Negative.  Negative for fatigue.  HENT: Negative.    Respiratory: Negative.    Cardiovascular: Negative.     Physical Exam  BP 118/68 (BP Location: Left Arm, Patient Position: Sitting, Cuff Size: Normal)   Pulse (!) 51   Temp (!) 97.5 F (36.4 C) (Temporal)   Ht 6' (1.829 m)   Wt 221 lb 12.8 oz (100.6 kg)   SpO2 97%   BMI 30.08 kg/m  Physical Exam Constitutional:      Appearance: Normal appearance.  HENT:     Head: Normocephalic and atraumatic.  Cardiovascular:     Rate and Rhythm: Normal rate and regular rhythm.  Pulmonary:     Effort: Pulmonary effort is normal.     Breath sounds: Normal breath sounds.  Skin:     General: Skin is warm and dry.  Neurological:     General: No focal deficit present.     Mental Status: He is alert and oriented to person, place, and time. Mental status is at baseline.  Psychiatric:        Mood and Affect: Mood normal.        Behavior: Behavior normal.        Thought Content: Thought content normal.        Judgment: Judgment normal.      Lab Results:  CBC    Component Value  Date/Time   WBC 7.3 04/28/2022 1046   RBC 5.12 04/28/2022 1046   HGB 14.8 04/28/2022 1046   HCT 44.2 04/28/2022 1046   PLT 238.0 04/28/2022 1046   MCV 86.4 04/28/2022 1046   MCH 24.6 (L) 05/14/2021 0510   MCHC 33.4 04/28/2022 1046   RDW 14.4 04/28/2022 1046   LYMPHSABS 1.5 04/28/2022 1046   MONOABS 0.7 04/28/2022 1046   EOSABS 0.4 04/28/2022 1046   BASOSABS 0.0 04/28/2022 1046    BMET    Component Value Date/Time   NA 139 09/26/2021 1236   K 4.5 09/26/2021 1236   CL 103 09/26/2021 1236   CO2 30 09/26/2021 1236   GLUCOSE 111 (H) 09/26/2021 1236   BUN 20 09/26/2021 1236   CREATININE 1.02 09/26/2021 1236   CREATININE 1.04 02/27/2020 1257   CALCIUM 8.7 09/26/2021 1236   GFRNONAA >60 05/14/2021 0510   GFRAA 45 (L) 01/27/2018 1748    BNP No results found for: "BNP"  ProBNP No results found for: "PROBNP"  Imaging: No results found.   Assessment & Plan:   Severe sleep apnea - Patient is 77% compliant with CPAP >4 hours in the last 30 days and reports benefit in daytime sleepiness from use. Sleep apnea is well-controlled on current pressure settings. Feels pressure can be too strong at times. We will lower max pressure to 15cm h20. Advised patient continue to wear CPAP nightly for 4 to 6 hours or longer. Caution with alcohol or sedatives prior to bedtime as these can worsen underlying sleep apnea. Advised against driving if experiencing excessive daytime sleepiness fatigue. Change mask and filter monthly, tubing every 3 months and replace water chamber every 6 months.    Orders: - Adjust CPAP pressure 5-15cm h20 (please place order) - Send CPAP supplies upon patient request only  Follow-up - 1 year with Waynetta Sandy NP for CPAP compliance   Glenford Bayley, NP 08/07/2022

## 2022-08-07 NOTE — Assessment & Plan Note (Addendum)
-   Patient is 77% compliant with CPAP >4 hours in the last 30 days and reports benefit in daytime sleepiness from use. Sleep apnea is well-controlled on current pressure settings. Feels pressure can be too strong at times. We will lower max pressure to 15cm h20. Advised patient continue to wear CPAP nightly for 4 to 6 hours or longer. Caution with alcohol or sedatives prior to bedtime as these can worsen underlying sleep apnea. Advised against driving if experiencing excessive daytime sleepiness fatigue.   Orders: - Adjust CPAP pressure 5-15cm h20 (please place order) - Send CPAP supplies upon patient request only  Follow-up - 1 year with Beth NP for CPAP compliance

## 2022-08-13 DIAGNOSIS — G4733 Obstructive sleep apnea (adult) (pediatric): Secondary | ICD-10-CM | POA: Diagnosis not present

## 2022-08-21 ENCOUNTER — Other Ambulatory Visit: Payer: Self-pay | Admitting: Family Medicine

## 2022-08-21 DIAGNOSIS — E119 Type 2 diabetes mellitus without complications: Secondary | ICD-10-CM

## 2022-08-25 DIAGNOSIS — G4733 Obstructive sleep apnea (adult) (pediatric): Secondary | ICD-10-CM | POA: Diagnosis not present

## 2022-08-31 DIAGNOSIS — B9789 Other viral agents as the cause of diseases classified elsewhere: Secondary | ICD-10-CM | POA: Diagnosis not present

## 2022-08-31 DIAGNOSIS — J028 Acute pharyngitis due to other specified organisms: Secondary | ICD-10-CM | POA: Diagnosis not present

## 2022-08-31 DIAGNOSIS — J069 Acute upper respiratory infection, unspecified: Secondary | ICD-10-CM | POA: Diagnosis not present

## 2022-08-31 DIAGNOSIS — J019 Acute sinusitis, unspecified: Secondary | ICD-10-CM | POA: Diagnosis not present

## 2022-09-03 ENCOUNTER — Ambulatory Visit: Payer: Medicare HMO | Admitting: Primary Care

## 2022-09-12 DIAGNOSIS — H1132 Conjunctival hemorrhage, left eye: Secondary | ICD-10-CM | POA: Diagnosis not present

## 2022-09-12 DIAGNOSIS — G4733 Obstructive sleep apnea (adult) (pediatric): Secondary | ICD-10-CM | POA: Diagnosis not present

## 2022-09-12 DIAGNOSIS — Z961 Presence of intraocular lens: Secondary | ICD-10-CM | POA: Diagnosis not present

## 2022-09-14 ENCOUNTER — Other Ambulatory Visit: Payer: Self-pay | Admitting: Family Medicine

## 2022-09-14 DIAGNOSIS — E119 Type 2 diabetes mellitus without complications: Secondary | ICD-10-CM

## 2022-09-18 ENCOUNTER — Other Ambulatory Visit: Payer: Self-pay | Admitting: Family Medicine

## 2022-09-18 ENCOUNTER — Ambulatory Visit: Payer: Medicare HMO | Admitting: Dermatology

## 2022-09-18 VITALS — BP 120/62 | HR 52

## 2022-09-18 DIAGNOSIS — L738 Other specified follicular disorders: Secondary | ICD-10-CM

## 2022-09-18 DIAGNOSIS — L918 Other hypertrophic disorders of the skin: Secondary | ICD-10-CM

## 2022-09-18 DIAGNOSIS — L570 Actinic keratosis: Secondary | ICD-10-CM

## 2022-09-18 DIAGNOSIS — L821 Other seborrheic keratosis: Secondary | ICD-10-CM | POA: Diagnosis not present

## 2022-09-18 DIAGNOSIS — L820 Inflamed seborrheic keratosis: Secondary | ICD-10-CM

## 2022-09-18 DIAGNOSIS — D1801 Hemangioma of skin and subcutaneous tissue: Secondary | ICD-10-CM

## 2022-09-18 DIAGNOSIS — L578 Other skin changes due to chronic exposure to nonionizing radiation: Secondary | ICD-10-CM

## 2022-09-18 DIAGNOSIS — L814 Other melanin hyperpigmentation: Secondary | ICD-10-CM | POA: Diagnosis not present

## 2022-09-18 DIAGNOSIS — L409 Psoriasis, unspecified: Secondary | ICD-10-CM

## 2022-09-18 DIAGNOSIS — Z1283 Encounter for screening for malignant neoplasm of skin: Secondary | ICD-10-CM | POA: Diagnosis not present

## 2022-09-18 DIAGNOSIS — Z79899 Other long term (current) drug therapy: Secondary | ICD-10-CM

## 2022-09-18 DIAGNOSIS — W908XXA Exposure to other nonionizing radiation, initial encounter: Secondary | ICD-10-CM | POA: Diagnosis not present

## 2022-09-18 DIAGNOSIS — E119 Type 2 diabetes mellitus without complications: Secondary | ICD-10-CM

## 2022-09-18 DIAGNOSIS — Z7189 Other specified counseling: Secondary | ICD-10-CM

## 2022-09-18 MED ORDER — MOMETASONE FUROATE 0.1 % EX SOLN: CUTANEOUS | 11 refills | Status: DC

## 2022-09-18 MED ORDER — ZORYVE 0.3 % EX CREA: TOPICAL_CREAM | CUTANEOUS | 6 refills | Status: AC

## 2022-09-18 NOTE — Patient Instructions (Signed)
Due to recent changes in healthcare laws, you may see results of your pathology and/or laboratory studies on MyChart before the doctors have had a chance to review them. We understand that in some cases there may be results that are confusing or concerning to you. Please understand that not all results are received at the same time and often the doctors may need to interpret multiple results in order to provide you with the best plan of care or course of treatment. Therefore, we ask that you please give us 2 business days to thoroughly review all your results before contacting the office for clarification. Should we see a critical lab result, you will be contacted sooner.   If You Need Anything After Your Visit  If you have any questions or concerns for your doctor, please call our main line at [phone] and press option 4 to reach your doctor's medical assistant. If no one answers, please leave a voicemail as directed and we will return your call as soon as possible. Messages left after 4 pm will be answered the following business day.   You may also send us a message via MyChart. We typically respond to MyChart messages within 1-2 business days.  For prescription refills, please ask your pharmacy to contact our office. Our fax number is [phone].  If you have an urgent issue when the clinic is closed that cannot wait until the next business day, you can page your doctor at the number below.    Please note that while we do our best to be available for urgent issues outside of office hours, we are not available 24/7.   If you have an urgent issue and are unable to reach us, you may choose to seek medical care at your doctor's office, retail clinic, urgent care center, or emergency room.  If you have a medical emergency, please immediately call 911 or go to the emergency department.  Pager Numbers  - Dr. Kowalski: [phone]  - Dr. Moye: [phone]  - Dr. Stewart:  [phone]  In the event of inclement weather, please call our main line at [phone] for an update on the status of any delays or closures.  Dermatology Medication Tips: Please keep the boxes that topical medications come in in order to help keep track of the instructions about where and how to use these. Pharmacies typically print the medication instructions only on the boxes and not directly on the medication tubes.   If your medication is too expensive, please contact our office at [phone] option 4 or send us a message through MyChart.   We are unable to tell what your co-pay for medications will be in advance as this is different depending on your insurance coverage. However, we may be able to find a substitute medication at lower cost or fill out paperwork to get insurance to cover a needed medication.   If a prior authorization is required to get your medication covered by your insurance company, please allow us 1-2 business days to complete this process.  Drug prices often vary depending on where the prescription is filled and some pharmacies may offer cheaper prices.  The website www.goodrx.com contains coupons for medications through different pharmacies. The prices here do not account for what the cost may be with help from insurance (it may be cheaper with your insurance), but the website can give you the price if you did not use any insurance.  - You can print the associated coupon and take it with   your prescription to the pharmacy.  - You may also stop by our office during regular business hours and pick up a GoodRx coupon card.  - If you need your prescription sent electronically to a different pharmacy, notify our office through [facility] MyChart or by phone at [phone] option 4.     Si Usted Necesita Algo Despus de Su Visita  Tambin puede enviarnos un mensaje a travs de MyChart. Por lo general respondemos a los mensajes de MyChart en el transcurso de 1 a 2  das hbiles.  Para renovar recetas, por favor pida a su farmacia que se ponga en contacto con nuestra oficina. Nuestro nmero de fax es el [phone].  Si tiene un asunto urgente cuando la clnica est cerrada y que no puede esperar hasta el siguiente da hbil, puede llamar/localizar a su doctor(a) al nmero que aparece a continuacin.   Por favor, tenga en cuenta que aunque hacemos todo lo posible para estar disponibles para asuntos urgentes fuera del horario de oficina, no estamos disponibles las 24 horas del da, los 7 das de la semana.   Si tiene un problema urgente y no puede comunicarse con nosotros, puede optar por buscar atencin mdica  en el consultorio de su doctor(a), en una clnica privada, en un centro de atencin urgente o en una sala de emergencias.  Si tiene una emergencia mdica, por favor llame inmediatamente al 911 o vaya a la sala de emergencias.  Nmeros de bper  - Dr. Kowalski: [phone]  - Dra. Moye: [phone]  - Dra. Stewart: [phone]  En caso de inclemencias del tiempo, por favor llame a nuestra lnea principal al [phone] para una actualizacin sobre el estado de cualquier retraso o cierre.  Consejos para la medicacin en dermatologa: Por favor, guarde las cajas en las que vienen los medicamentos de uso tpico para ayudarle a seguir las instrucciones sobre dnde y cmo usarlos. Las farmacias generalmente imprimen las instrucciones del medicamento slo en las cajas y no directamente en los tubos del medicamento.   Si su medicamento es muy caro, por favor, pngase en contacto con nuestra oficina llamando al [phone] y presione la opcin 4 o envenos un mensaje a travs de MyChart.   No podemos decirle cul ser su copago por los medicamentos por adelantado ya que esto es diferente dependiendo de la cobertura de su seguro. Sin embargo, es posible que podamos encontrar un medicamento sustituto a menor costo o llenar un formulario para que el  seguro cubra el medicamento que se considera necesario.   Si se requiere una autorizacin previa para que su compaa de seguros cubra su medicamento, por favor permtanos de 1 a 2 das hbiles para completar este proceso.  Los precios de los medicamentos varan con frecuencia dependiendo del lugar de dnde se surte la receta y alguna farmacias pueden ofrecer precios ms baratos.  El sitio web www.goodrx.com tiene cupones para medicamentos de diferentes farmacias. Los precios aqu no tienen en cuenta lo que podra costar con la ayuda del seguro (puede ser ms barato con su seguro), pero el sitio web puede darle el precio si no utiliz ningn seguro.  - Puede imprimir el cupn correspondiente y llevarlo con su receta a la farmacia.  - Tambin puede pasar por nuestra oficina durante el horario de atencin regular y recoger una tarjeta de cupones de GoodRx.  - Si necesita que su receta se enve electrnicamente a una farmacia diferente, informe a nuestra oficina a travs de MyChart de [facility]   o por telfono llamando al [phone] y presione la opcin 4.  

## 2022-09-18 NOTE — Progress Notes (Signed)
Follow-Up Visit   Subjective  [name] [name] is a 72 y.o. male who presents for the following: Skin Cancer Screening and Full Body Skin Exam The patient presents for Total-Body Skin Exam (TBSE) for skin cancer screening and mole check. The patient has spots, moles and lesions to be evaluated, some may be new or changing and the patient may have concern these could be cancer.  The following portions of the chart were reviewed this encounter and updated as appropriate: medications, allergies, medical history  Review of Systems:  No other skin or systemic complaints except as noted in HPI or Assessment and Plan.  Objective  Well appearing patient in no apparent distress; mood and affect are within normal limits.  A full examination was performed including scalp, head, eyes, ears, nose, lips, neck, chest, axillae, abdomen, back, buttocks, bilateral upper extremities, bilateral lower extremities, hands, feet, fingers, toes, fingernails, and toenails. All findings within normal limits unless otherwise noted below.   Relevant physical exam findings are noted in the Assessment and Plan.  R cheek x 1 (4) Erythematous thin papules/macules with gritty scale.   L preauricular x 1, back x 1 (2) Erythematous stuck-on, waxy papule or plaque   Assessment & Plan   AK (actinic keratosis) (4) R cheek x 1  Destruction of lesion - R cheek x 1 (4) Complexity: simple   Destruction method: cryotherapy   Informed consent: discussed and consent obtained   Timeout:  patient name, date of birth, surgical site, and procedure verified Lesion destroyed using liquid nitrogen: Yes   Region frozen until ice ball extended beyond lesion: Yes   Outcome: patient tolerated procedure well with no complications   Post-procedure details: wound care instructions given    Inflamed seborrheic keratosis (2) L preauricular x 1, back x 1  Symptomatic, irritating, patient would like treated.   Destruction of lesion  - L preauricular x 1, back x 1 (2) Complexity: simple   Destruction method: cryotherapy   Informed consent: discussed and consent obtained   Timeout:  patient name, date of birth, surgical site, and procedure verified Lesion destroyed using liquid nitrogen: Yes   Region frozen until ice ball extended beyond lesion: Yes   Outcome: patient tolerated procedure well with no complications   Post-procedure details: wound care instructions given    SKIN CANCER SCREENING PERFORMED TODAY.  ACTINIC DAMAGE - Chronic condition, secondary to cumulative UV/sun exposure - diffuse scaly erythematous macules with underlying dyspigmentation - Recommend daily broad spectrum sunscreen SPF 30+ to sun-exposed areas, reapply every 2 hours as needed.  - Staying in the shade or wearing long sleeves, sun glasses (UVA+UVB protection) and wide brim hats (4-inch brim around the entire circumference of the hat) are also recommended for sun protection.  - Call for new or changing lesions.  LENTIGINES, SEBORRHEIC KERATOSES, HEMANGIOMAS - Benign normal skin lesions - Benign-appearing - Call for any changes  MELANOCYTIC NEVI - Tan-brown and/or pink-flesh-colored symmetric macules and papules - Benign appearing on exam today - Observation - Call clinic for new or changing moles - Recommend daily use of broad spectrum spf 30+ sunscreen to sun-exposed areas.   PSORIASIS - scalp and ears Exam: Well-demarcated erythematous papules/plaques with silvery scale, guttate pink scaly papules. 4% BSA. Chronic and persistent condition with duration or expected duration over one year. Condition is symptomatic/ bothersome to patient. Not currently at goal.  patient denies joint pain  Psoriasis is a chronic non-curable, but treatable genetic/hereditary disease that may have other systemic  features affecting other organ systems such as joints (Psoriatic Arthritis). It is associated with an increased risk of inflammatory bowel  disease, heart disease, non-alcoholic fatty liver disease, and depression.  Treatments include light and laser treatments; topical medications; and systemic medications including oral and injectables.  Treatment Plan: Start Zoryve cream to aa's QD. Continue Mometasone solution to aa's up to 5d/week. Patient hasn't used Dermotic oil in a very long time so we will not fill it at this time. Long term medication management.  Patient is using long term (months to years) prescription medication  to control their dermatologic condition.  These medications require periodic monitoring to evaluate for efficacy and side effects and may require periodic laboratory monitoring.  Sebaceous Hyperplasia - Small yellow papules with a central dell - Benign-appearing - Observe. Call for changes.  Acrochordons (Skin Tags) - Fleshy, skin-colored pedunculated papules - Benign appearing.  - Observe. - If desired, they can be removed with an in office procedure that is not covered by insurance. - Please call the clinic if you notice any new or changing lesions.  ACROCHORDONS (Skin Tags) - Removal desired by patient Symptomatic, irritating, patient would like treated.  - Fleshy, skin-colored pedunculated papules - Benign appearing.  - Patient desires removal. Reviewed that this is not covered by insurance and they will be charged a cosmetic fee for removal. Patient signed non-covered consent.  - Prior to procedure, discussed risks of blister formation, small wound, skin dyspigmentation, or rare scar following cryotherapy.   Destruction Procedure Note Destruction method: cryotherapy   Informed consent: discussed and consent obtained   Lesion destroyed using liquid nitrogen: Yes   Outcome: patient tolerated procedure well with no complications   Post-procedure details: wound care instructions given   Locations: groin  # of Lesions Treated: 3  Prior to procedure, discussed risks of blister formation, small  wound, skin dyspigmentation, or rare scar following cryotherapy. Recommend Vaseline ointment to treated areas while healing.   Return in about 1 year (around 09/18/2023) for TBSE.  Maylene Roes, CMA, am acting as scribe for Armida Sans, MD .  Documentation: I have reviewed the above documentation for accuracy and completeness, and I agree with the above.  Armida Sans, MD

## 2022-09-19 ENCOUNTER — Other Ambulatory Visit (INDEPENDENT_AMBULATORY_CARE_PROVIDER_SITE_OTHER): Payer: Medicare HMO

## 2022-09-19 ENCOUNTER — Telehealth: Payer: Self-pay | Admitting: Family Medicine

## 2022-09-19 ENCOUNTER — Encounter: Payer: Self-pay | Admitting: Dermatology

## 2022-09-19 DIAGNOSIS — N138 Other obstructive and reflux uropathy: Secondary | ICD-10-CM

## 2022-09-19 DIAGNOSIS — N401 Enlarged prostate with lower urinary tract symptoms: Secondary | ICD-10-CM

## 2022-09-19 DIAGNOSIS — D509 Iron deficiency anemia, unspecified: Secondary | ICD-10-CM | POA: Diagnosis not present

## 2022-09-19 DIAGNOSIS — E119 Type 2 diabetes mellitus without complications: Secondary | ICD-10-CM

## 2022-09-19 DIAGNOSIS — I10 Essential (primary) hypertension: Secondary | ICD-10-CM

## 2022-09-19 LAB — HEMOGLOBIN A1C: Hgb A1c MFr Bld: 6.6 % — ABNORMAL HIGH (ref 4.6–6.5)

## 2022-09-19 LAB — BASIC METABOLIC PANEL
BUN: 20 mg/dL (ref 6–23)
CO2: 32 mEq/L (ref 19–32)
Calcium: 9.1 mg/dL (ref 8.4–10.5)
Chloride: 102 mEq/L (ref 96–112)
Creatinine, Ser: 0.97 mg/dL (ref 0.40–1.50)
GFR: 78.67 mL/min (ref >60.00)
Glucose, Bld: 112 mg/dL — ABNORMAL HIGH (ref 70–99)
Potassium: 4.4 mEq/L (ref 3.5–5.1)
Sodium: 139 mEq/L (ref 135–145)

## 2022-09-19 LAB — PSA: PSA: 2.13 ng/mL (ref 0.10–4.00)

## 2022-09-19 LAB — HEPATIC FUNCTION PANEL
ALT: 20 U/L (ref 0–53)
AST: 16 U/L (ref 0–37)
Albumin: 3.8 g/dL (ref 3.5–5.2)
Alkaline Phosphatase: 60 U/L (ref 39–117)
Bilirubin, Direct: 0.2 mg/dL (ref 0.0–0.3)
Total Bilirubin: 0.9 mg/dL (ref 0.2–1.2)
Total Protein: 6.1 g/dL (ref 6.0–8.3)

## 2022-09-19 LAB — CBC WITH DIFFERENTIAL/PLATELET
Basophils Absolute: 0.1 10*3/uL (ref 0.0–0.1)
Basophils Relative: 0.9 % (ref 0.0–3.0)
Eosinophils Absolute: 0.5 10*3/uL (ref 0.0–0.7)
Eosinophils Relative: 6.9 % — ABNORMAL HIGH (ref 0.0–5.0)
HCT: 43.7 % (ref 39.0–52.0)
Hemoglobin: 14.6 g/dL (ref 13.0–17.0)
Lymphocytes Relative: 22.2 % (ref 12.0–46.0)
Lymphs Abs: 1.5 10*3/uL (ref 0.7–4.0)
MCHC: 33.4 g/dL (ref 30.0–36.0)
MCV: 88.1 fl (ref 78.0–100.0)
Monocytes Absolute: 0.6 10*3/uL (ref 0.1–1.0)
Monocytes Relative: 8.6 % (ref 3.0–12.0)
Neutro Abs: 4.2 10*3/uL (ref 1.4–7.7)
Neutrophils Relative %: 61.4 % (ref 43.0–77.0)
Platelets: 169 10*3/uL (ref 150.0–400.0)
RBC: 4.96 Mil/uL (ref 4.22–5.81)
RDW: 14 % (ref 11.5–15.5)
WBC: 6.8 10*3/uL (ref 4.0–10.5)

## 2022-09-19 LAB — IBC + FERRITIN
Ferritin: 21.4 ng/mL — ABNORMAL LOW (ref 22.0–322.0)
Iron: 74 ug/dL (ref 42–165)
Saturation Ratios: 21.1 % (ref 20.0–50.0)
TIBC: 350 ug/dL (ref 250.0–450.0)
Transferrin: 250 mg/dL (ref 212.0–360.0)

## 2022-09-19 LAB — LIPID PANEL
Cholesterol: 130 mg/dL (ref 0–200)
HDL: 43.6 mg/dL (ref >39.00)
LDL Cholesterol: 70 mg/dL (ref 0–99)
NonHDL: 86.42
Total CHOL/HDL Ratio: 3
Triglycerides: 83 mg/dL (ref 0.0–149.0)
VLDL: 16.6 mg/dL (ref 0.0–40.0)

## 2022-09-19 LAB — TSH: TSH: 2.81 u[IU]/mL (ref 0.35–5.50)

## 2022-09-19 NOTE — Telephone Encounter (Signed)
Here for labs

## 2022-09-19 NOTE — Telephone Encounter (Signed)
he original prescription was discontinued on 04/28/2022 by Carola Rhine, CMA for the following reason: No longer needed (for PRN medications). Renewing this prescription may not be appropriate.

## 2022-09-22 ENCOUNTER — Other Ambulatory Visit: Payer: Medicare HMO

## 2022-09-29 ENCOUNTER — Ambulatory Visit (INDEPENDENT_AMBULATORY_CARE_PROVIDER_SITE_OTHER): Payer: Medicare HMO | Admitting: Family Medicine

## 2022-09-29 ENCOUNTER — Encounter: Payer: Self-pay | Admitting: Family Medicine

## 2022-09-29 VITALS — BP 118/70 | HR 52 | Temp 98.3°F | Ht 72.25 in | Wt 224.0 lb

## 2022-09-29 DIAGNOSIS — E782 Mixed hyperlipidemia: Secondary | ICD-10-CM | POA: Diagnosis not present

## 2022-09-29 DIAGNOSIS — M8580 Other specified disorders of bone density and structure, unspecified site: Secondary | ICD-10-CM | POA: Diagnosis not present

## 2022-09-29 DIAGNOSIS — M899 Disorder of bone, unspecified: Secondary | ICD-10-CM

## 2022-09-29 DIAGNOSIS — I480 Paroxysmal atrial fibrillation: Secondary | ICD-10-CM

## 2022-09-29 DIAGNOSIS — K219 Gastro-esophageal reflux disease without esophagitis: Secondary | ICD-10-CM

## 2022-09-29 DIAGNOSIS — M949 Disorder of cartilage, unspecified: Secondary | ICD-10-CM

## 2022-09-29 DIAGNOSIS — I10 Essential (primary) hypertension: Secondary | ICD-10-CM | POA: Diagnosis not present

## 2022-09-29 DIAGNOSIS — D509 Iron deficiency anemia, unspecified: Secondary | ICD-10-CM

## 2022-09-29 DIAGNOSIS — E119 Type 2 diabetes mellitus without complications: Secondary | ICD-10-CM | POA: Diagnosis not present

## 2022-09-29 DIAGNOSIS — F411 Generalized anxiety disorder: Secondary | ICD-10-CM

## 2022-09-29 DIAGNOSIS — E291 Testicular hypofunction: Secondary | ICD-10-CM | POA: Diagnosis not present

## 2022-09-29 LAB — TESTOSTERONE: Testosterone: 371.04 ng/dL (ref 300.00–890.00)

## 2022-09-29 MED ORDER — OMEPRAZOLE 40 MG PO CPDR: 40.0000 mg | DELAYED_RELEASE_CAPSULE | Freq: Every morning | ORAL | 3 refills | Status: DC

## 2022-09-29 MED ORDER — ATORVASTATIN CALCIUM 40 MG PO TABS: 40.0000 mg | ORAL_TABLET | Freq: Every day | ORAL | 3 refills | Status: DC

## 2022-09-29 MED ORDER — ATENOLOL 50 MG PO TABS: 50.0000 mg | ORAL_TABLET | Freq: Two times a day (BID) | ORAL | 3 refills | Status: DC

## 2022-09-29 MED ORDER — FAMOTIDINE 40 MG PO TABS: 40.0000 mg | ORAL_TABLET | Freq: Every evening | ORAL | 3 refills | Status: DC

## 2022-09-29 MED ORDER — METFORMIN HCL 500 MG PO TABS: 500.0000 mg | ORAL_TABLET | Freq: Two times a day (BID) | ORAL | 3 refills | Status: DC

## 2022-09-29 MED ORDER — LISINOPRIL 5 MG PO TABS: 5.0000 mg | ORAL_TABLET | Freq: Every day | ORAL | 3 refills | Status: DC

## 2022-09-29 NOTE — Progress Notes (Signed)
Subjective:    Patient ID: [name] [name], male    DOB: 1950/05/31, 72 y.o.   MRN: [id number]  HPI Here to follow up on issues. He feels well in general except for some mild fatigue. We have been treating his anemia with iron pills, and his iron is up to 74 and the ferritin is borderline at 21.4. His atrial fibrillation has been stable for the most part. He has increased the Atenolol on his own to 50 mg BID, and this has been working well for him. His BP is stable. His anxiety is stable. His diabetes is stable with an A1c of 6.6%. he wants to check a testosterone level again (it was low in the past) to see if his libido can be improved. He is having special made hard contact lenses for his keratoconus.    Review of Systems  Constitutional:  Positive for fatigue.  HENT: Negative.    Eyes:  Positive for visual disturbance.  Respiratory: Negative.    Cardiovascular: Negative.   Gastrointestinal: Negative.   Genitourinary: Negative.   Musculoskeletal: Negative.   Skin: Negative.   Neurological: Negative.   Psychiatric/Behavioral: Negative.         Objective:   Physical Exam Constitutional:      General: He is not in acute distress.    Appearance: Normal appearance. He is well-developed. He is not diaphoretic.  HENT:     Head: Normocephalic and atraumatic.     Right Ear: External ear normal.     Left Ear: External ear normal.     Nose: Nose normal.     Mouth/Throat:     Pharynx: No oropharyngeal exudate.  Eyes:     General: No scleral icterus.       Right eye: No discharge.        Left eye: No discharge.     Conjunctiva/sclera: Conjunctivae normal.     Pupils: Pupils are equal, round, and reactive to light.  Neck:     Thyroid: No thyromegaly.     Vascular: No JVD.     Trachea: No tracheal deviation.  Cardiovascular:     Rate and Rhythm: Normal rate and regular rhythm.     Pulses: Normal pulses.     Heart sounds: Normal heart sounds. No murmur heard.    No friction rub.  No gallop.  Pulmonary:     Effort: Pulmonary effort is normal. No respiratory distress.     Breath sounds: Normal breath sounds. No wheezing or rales.  Chest:     Chest wall: No tenderness.  Abdominal:     General: Bowel sounds are normal. There is no distension.     Palpations: Abdomen is soft. There is no mass.     Tenderness: There is no abdominal tenderness. There is no guarding or rebound.  Genitourinary:    Penis: Normal. No tenderness.      Testes: Normal.     Prostate: Normal.     Rectum: Normal. Guaiac result negative.  Musculoskeletal:        General: No tenderness. Normal range of motion.     Cervical back: Neck supple.  Lymphadenopathy:     Cervical: No cervical adenopathy.  Skin:    General: Skin is warm and dry.     Coloration: Skin is not pale.     Findings: No erythema or rash.  Neurological:     General: No focal deficit present.     Mental Status: He is alert and oriented to  person, place, and time.     Cranial Nerves: No cranial nerve deficit.     Motor: No abnormal muscle tone.     Coordination: Coordination normal.     Deep Tendon Reflexes: Reflexes are normal and symmetric. Reflexes normal.  Psychiatric:        Behavior: Behavior normal.        Thought Content: Thought content normal.        Judgment: Judgment normal.           Assessment & Plan:  His HTN and PAF are stable. His anemia is stable. His diabetes is well controlled. His anxiety is stable. We will check a testosterone level today. He has a colonoscopy coming up this fall. We spent a total of ( 33  ) minutes reviewing records and discussing these issues.  Gershon Crane, MD

## 2022-10-09 DIAGNOSIS — G4733 Obstructive sleep apnea (adult) (pediatric): Secondary | ICD-10-CM | POA: Diagnosis not present

## 2022-10-13 DIAGNOSIS — G4733 Obstructive sleep apnea (adult) (pediatric): Secondary | ICD-10-CM | POA: Diagnosis not present

## 2022-11-09 DIAGNOSIS — G4733 Obstructive sleep apnea (adult) (pediatric): Secondary | ICD-10-CM | POA: Diagnosis not present

## 2022-11-13 DIAGNOSIS — G4733 Obstructive sleep apnea (adult) (pediatric): Secondary | ICD-10-CM | POA: Diagnosis not present

## 2022-11-27 ENCOUNTER — Ambulatory Visit
Admission: RE | Admit: 2022-11-27 | Discharge: 2022-11-27 | Disposition: A | Discharge status: Home / Self Care | Payer: Medicare HMO | Source: Ambulatory Visit | Attending: Urology | Admitting: Urology

## 2022-11-27 ENCOUNTER — Ambulatory Visit
Admission: RE | Admit: 2022-11-27 | Discharge: 2022-11-27 | Disposition: A | Discharge status: Home / Self Care | Payer: Medicare HMO | Attending: Urology | Admitting: Urology

## 2022-11-27 ENCOUNTER — Encounter: Payer: Self-pay | Admitting: Urology

## 2022-11-27 ENCOUNTER — Ambulatory Visit: Payer: Medicare HMO | Admitting: Urology

## 2022-11-27 VITALS — BP 121/76 | HR 52 | Ht 72.0 in | Wt 220.0 lb

## 2022-11-27 DIAGNOSIS — N200 Calculus of kidney: Secondary | ICD-10-CM

## 2022-11-27 NOTE — Progress Notes (Signed)
I,Amy L Pierron,acting as a scribe for Riki Altes, MD.,have documented all relevant documentation on the behalf of Riki Altes, MD,as directed by  Riki Altes, MD while in the presence of Riki Altes, MD.  11/27/2022 1:51 PM   Janee Morn Jul 31, 1950 [id number]  Referring provider: Nelwyn Salisbury, MD [address] [place],  [place] [address]  Chief Complaint  Patient presents with   Nephrolithiasis    Urologic history: 1.  Recurrent nephrolithiasis Ureteroscopic removal left proximal ureteral calculus and renal calculi x2 05/16/2021  HPI: 72 y.o. male presents for annual follow-up.  Doing well since last visit No bothersome LUTS Denies dysuria, gross hematuria Denies flank, abdominal or pelvic pain Decided not to pursue a metabolic evaluation KUB today indicated the punctate left lower pole calculus is not definitely visualized, though it was most likely obscured by overlying stool and bowel gas.    PMH: Past Medical History:  Diagnosis Date   Allergy    Anxiety    Arthritis    Atrial fibrillation (HCC)    sees Dr. Ladona Ridgel   Cataract    forming   Chronic kidney disease    kidney stone   Depression    Dizziness    or vertigo   Dysrhythmia    atrial fibrillation, once or twice a year   GERD (gastroesophageal reflux disease)    Headache    eyglass flicker in eye is migraine symptoms   History of kidney stones    HLD (hyperlipidemia)    HTN (hypertension)    Keratoconus of both eyes    sees Tyrone Schimke MD and Fredrich Birks OD   Osteopenia    Retinal tear    Sinus bradycardia     Surgical History: Past Surgical History:  Procedure Laterality Date   CARDIAC CATHETERIZATION  2003   normal   COLONOSCOPY  01/04/2018   per Dr. Russella Dar, adenomatous  polyp, repeat in 5 years   CYSTOSCOPY/URETEROSCOPY/HOLMIUM LASER/STENT PLACEMENT Left 05/16/2021   Procedure: CYSTOSCOPY/URETEROSCOPY/HOLMIUM LASER/STENT PLACEMENT;  Surgeon: Riki Altes,  MD;  Location: [facility];  Service: Urology;  Laterality: Left;   EXTRACORPOREAL SHOCK WAVE LITHOTRIPSY Left 01/21/2018   Procedure: EXTRACORPOREAL SHOCK WAVE LITHOTRIPSY (ESWL);  Surgeon: Vanna Scotland, MD;  Location: [facility];  Service: Urology;  Laterality: Left;   EYE SURGERY     retinal   POLYPECTOMY     RETINAL LASER PROCEDURE Right    repair retinal tear    WART REMOVAL     LEFT UPPER ARM   WISDOM TOOTH EXTRACTION      Home Medications:  Allergies as of 11/27/2022       Reactions   Oxycodone Nausea And Vomiting   Paroxetine Nausea And Vomiting, Other (See Comments)   Altered mental state   Sulfa Antibiotics Hives        Medication List        Accurate as of November 27, 2022  1:51 PM. If you have any questions, ask your nurse or doctor.          aspirin EC 325 MG tablet Take 1 tablet (325 mg total) by mouth daily.   atenolol 50 MG tablet Commonly known as: TENORMIN Take 1 tablet (50 mg total) by mouth 2 (two) times daily.   atorvastatin 40 MG tablet Commonly known as: LIPITOR Take 1 tablet (40 mg total) by mouth daily.   CALCIUM-MAGNESIUM-ZINC PO Take 1 tablet by mouth in the morning.   famotidine 40  MG tablet Commonly known as: PEPCID Take 1 tablet (40 mg total) by mouth every evening.   FeroSul 325 (65 Fe) MG tablet Generic drug: ferrous sulfate Take 1 tablet by mouth once daily What changed: when to take this   flecainide 100 MG tablet Commonly known as: TAMBOCOR MAY TAKE TWO TABLETS BY MOUTH DAILY AS NEEDED FOR BREAKTHROUGH A-FIB   Fluocinolone Acetonide 0.01 % Oil Commonly known as: DermOtic Place 1-2 drops in ear(s) 3 (three) times a week. 1-2 gtts to aa ears for psoriasis up to 3 days a week prn flares   fluticasone 50 MCG/ACT nasal spray Commonly known as: FLONASE Place 2 sprays into both nostrils in the morning.   lisinopril 5 MG tablet Commonly known as: ZESTRIL Take 1 tablet (5 mg total) by mouth daily.   metFORMIN 500 MG  tablet Commonly known as: GLUCOPHAGE Take 1 tablet (500 mg total) by mouth 2 (two) times daily with a meal.   mometasone 0.1 % lotion Commonly known as: ELOCON For psoriasis of the scalp apply to aa's QHS and wash out in the morning. Use up to 5d/wk PRN. Avoid f/g/a.   omeprazole 40 MG capsule Commonly known as: PRILOSEC Take 1 capsule (40 mg total) by mouth every morning.   sildenafil 20 MG tablet Commonly known as: REVATIO TAKE 5 TABLETS BY MOUTH AS NEEDED   venlafaxine XR 37.5 MG 24 hr capsule Commonly known as: EFFEXOR-XR TAKE 1 CAPSULE BY MOUTH ONCE DAILY WITH BREAKFAST   VITAMIN D3 PO Take 2,000 Units by mouth in the morning.   Zoryve 0.3 % Crea Generic drug: Roflumilast Apply to aa's psoriasis QD PRN.        Allergies:  Allergies  Allergen Reactions   Oxycodone Nausea And Vomiting   Paroxetine Nausea And Vomiting and Other (See Comments)    Altered mental state   Sulfa Antibiotics Hives    Family History: Family History  Problem Relation Age of Onset   Hodgkin's lymphoma Father    Hyperlipidemia Other    Hypertension Mother    Stroke Mother    Heart attack Brother    Prostate cancer Brother        1st degree relative <50   Diabetes Brother        1st degree relative   Multiple myeloma Brother    Hypertension Brother    Heart disease Brother    Heart attack Brother    Colon polyps Brother    Colon cancer Neg Hx    Esophageal cancer Neg Hx    Rectal cancer Neg Hx    Stomach cancer Neg Hx     Social History:  reports that he quit smoking about 47 years ago. His smoking use included cigarettes. He has never used smokeless tobacco. He reports current alcohol use. He reports that he does not use drugs.   Physical Exam: BP 121/76 (BP Location: Left Arm, Patient Position: Sitting, Cuff Size: Normal)   Pulse (!) 52   Ht 6' (1.829 m)   Wt 220 lb (99.8 kg)   BMI 29.84 kg/m   Constitutional:  Alert and oriented, No acute distress. HEENT: [place]  AT Respiratory: Normal respiratory effort, no increased work of breathing. Psychiatric: Normal mood and affect.   Pertinent Imaging: Images of a KUB performed today were personally reviewed and interpreted.  The x-ray has not been read by radiology.    Assessment & Plan:    1. Recurrent nephrolithiasis Stable Will have him follow up in 2  years with a KUB and instructed to call earlier for recurrent stone symptoms.   I have reviewed the above documentation for accuracy and completeness, and I agree with the above.   Riki Altes, MD  [facility] [address], [address] [place], [place] [address] [phone]

## 2022-12-13 ENCOUNTER — Other Ambulatory Visit: Payer: Self-pay | Admitting: Family Medicine

## 2022-12-17 ENCOUNTER — Encounter: Payer: Self-pay | Admitting: Gastroenterology

## 2023-01-16 ENCOUNTER — Ambulatory Visit (AMBULATORY_SURGERY_CENTER): Payer: Medicare HMO | Admitting: *Deleted

## 2023-01-16 VITALS — Ht 72.0 in | Wt 219.0 lb

## 2023-01-16 DIAGNOSIS — Z860100 Personal history of colon polyps, unspecified: Secondary | ICD-10-CM

## 2023-01-16 MED ORDER — NA SULFATE-K SULFATE-MG SULF 17.5-3.13-1.6 GM/177ML PO SOLN
1.0000 | Freq: Once | ORAL | 0 refills | Status: AC
Start: 2023-01-16 — End: 2023-01-16

## 2023-01-16 NOTE — Progress Notes (Signed)
Pt's name and DOB verified at the beginning of the pre-visit wit 2 identifiers  Pt denies any difficulty with ambulating,sitting, laying down or rolling side to side  Pt has issues with ambulation   Pt has no issues moving head neck or swallowing  No egg or soy allergy known to patient   No issues known to pt with past sedation with any surgeries or procedures  Pt denies having issues being intubated  No FH of Malignant Hyperthermia  Pt is not on diet pills or shots  Pt is not on home 02   Pt is not on blood thinners   Pt denies issues with constipation   Pt is not on dialysis  Pt has hx of AFib  Pt denies any upcoming cardiac testing  Pt encouraged to use to use Singlecare or Goodrx to reduce cost   Patient's chart reviewed by Cathlyn Parsons CNRA prior to pre-visit and patient appropriate for the [facility].  Pre-visit completed and red dot placed by patient's name on their procedure day (on provider's schedule).  .  Visit by phone  Pt states weight is 219 lb  Instructed pt why it is important to and  to call if they have any changes in health or new medications. Directed them to the # given and on instructions.     Instructions reviewed. Pt given both [facility] main # and MD on call # prior to instructions.  Pt states understanding. Instructed to review again prior to procedure. Pt states they will.   Instructions sent by mail Instructions sent through My Chart  Coupon sent via text to mobile phone and pt verified they received it

## 2023-01-23 ENCOUNTER — Encounter: Payer: Self-pay | Admitting: Gastroenterology

## 2023-01-26 ENCOUNTER — Other Ambulatory Visit: Payer: Self-pay | Admitting: Family Medicine

## 2023-01-29 DIAGNOSIS — J019 Acute sinusitis, unspecified: Secondary | ICD-10-CM | POA: Diagnosis not present

## 2023-01-29 DIAGNOSIS — B9689 Other specified bacterial agents as the cause of diseases classified elsewhere: Secondary | ICD-10-CM | POA: Diagnosis not present

## 2023-02-11 ENCOUNTER — Encounter: Payer: Medicare HMO | Admitting: Gastroenterology

## 2023-02-23 DIAGNOSIS — G4733 Obstructive sleep apnea (adult) (pediatric): Secondary | ICD-10-CM | POA: Diagnosis not present

## 2023-02-24 ENCOUNTER — Encounter: Payer: Self-pay | Admitting: Gastroenterology

## 2023-02-24 ENCOUNTER — Ambulatory Visit: Payer: Medicare HMO | Admitting: Gastroenterology

## 2023-02-24 VITALS — BP 104/62 | HR 49 | Temp 98.4°F | Resp 13 | Ht 72.0 in | Wt 219.0 lb

## 2023-02-24 DIAGNOSIS — I10 Essential (primary) hypertension: Secondary | ICD-10-CM | POA: Diagnosis not present

## 2023-02-24 DIAGNOSIS — K5730 Diverticulosis of large intestine without perforation or abscess without bleeding: Secondary | ICD-10-CM

## 2023-02-24 DIAGNOSIS — I4891 Unspecified atrial fibrillation: Secondary | ICD-10-CM | POA: Diagnosis not present

## 2023-02-24 DIAGNOSIS — F419 Anxiety disorder, unspecified: Secondary | ICD-10-CM | POA: Diagnosis not present

## 2023-02-24 DIAGNOSIS — E119 Type 2 diabetes mellitus without complications: Secondary | ICD-10-CM | POA: Diagnosis not present

## 2023-02-24 DIAGNOSIS — K644 Residual hemorrhoidal skin tags: Secondary | ICD-10-CM | POA: Diagnosis not present

## 2023-02-24 DIAGNOSIS — Z1211 Encounter for screening for malignant neoplasm of colon: Secondary | ICD-10-CM

## 2023-02-24 DIAGNOSIS — D127 Benign neoplasm of rectosigmoid junction: Secondary | ICD-10-CM

## 2023-02-24 DIAGNOSIS — Z860101 Personal history of adenomatous and serrated colon polyps: Secondary | ICD-10-CM | POA: Diagnosis not present

## 2023-02-24 DIAGNOSIS — D123 Benign neoplasm of transverse colon: Secondary | ICD-10-CM

## 2023-02-24 DIAGNOSIS — D120 Benign neoplasm of cecum: Secondary | ICD-10-CM

## 2023-02-24 DIAGNOSIS — K641 Second degree hemorrhoids: Secondary | ICD-10-CM | POA: Diagnosis not present

## 2023-02-24 DIAGNOSIS — D125 Benign neoplasm of sigmoid colon: Secondary | ICD-10-CM

## 2023-02-24 DIAGNOSIS — G4730 Sleep apnea, unspecified: Secondary | ICD-10-CM | POA: Diagnosis not present

## 2023-02-24 DIAGNOSIS — D122 Benign neoplasm of ascending colon: Secondary | ICD-10-CM

## 2023-02-24 DIAGNOSIS — Z860100 Personal history of colon polyps, unspecified: Secondary | ICD-10-CM

## 2023-02-24 HISTORY — PX: COLONOSCOPY: SHX174

## 2023-02-24 MED ORDER — SODIUM CHLORIDE 0.9 % IV SOLN: 500.0000 mL | Freq: Once | INTRAVENOUS | Status: DC

## 2023-02-24 NOTE — Patient Instructions (Addendum)
Await pathology results.  High fiber diet.  Use FiberCon 1-2 tablets daily.  Recommend use of wet wipes as needed for wiping.  Continue present medications.  Handout on polyps, hemorrhoids, and hemorrhoid banding provided.  YOU HAD AN ENDOSCOPIC PROCEDURE TODAY AT THE [facility]:   Refer to the procedure report that was given to you for any specific questions about what was found during the examination.  If the procedure report does not answer your questions, please call your gastroenterologist to clarify.  If you requested that your care partner not be given the details of your procedure findings, then the procedure report has been included in a sealed envelope for you to review at your convenience later.  YOU SHOULD EXPECT: Some feelings of bloating in the abdomen. Passage of more gas than usual.  Walking can help get rid of the air that was put into your GI tract during the procedure and reduce the bloating. If you had a lower endoscopy (such as a colonoscopy or flexible sigmoidoscopy) you may notice spotting of blood in your stool or on the toilet paper. If you underwent a bowel prep for your procedure, you may not have a normal bowel movement for a few days.  Please Note:  You might notice some irritation and congestion in your nose or some drainage.  This is from the oxygen used during your procedure.  There is no need for concern and it should clear up in a day or so.  SYMPTOMS TO REPORT IMMEDIATELY:  Following lower endoscopy (colonoscopy or flexible sigmoidoscopy):  Excessive amounts of blood in the stool  Significant tenderness or worsening of abdominal pains  Swelling of the abdomen that is new, acute  Fever of 100F or higher   For urgent or emergent issues, a gastroenterologist can be reached at any hour by calling [phone]. Do not use MyChart messaging for urgent concerns.    DIET:  We do recommend a small meal at first, but then you may proceed to your  regular diet.  Drink plenty of fluids but you should avoid alcoholic beverages for 24 hours.  ACTIVITY:  You should plan to take it easy for the rest of today and you should NOT DRIVE or use heavy machinery until tomorrow (because of the sedation medicines used during the test).    FOLLOW UP: Our staff will call the number listed on your records the next business day following your procedure.  We will call around 7:15- 8:00 am to check on you and address any questions or concerns that you may have regarding the information given to you following your procedure. If we do not reach you, we will leave a message.     If any biopsies were taken you will be contacted by phone or by letter within the next 1-3 weeks.  Please call us at [phone] if you have not heard about the biopsies in 3 weeks.    SIGNATURES/CONFIDENTIALITY: You and/or your care partner have signed paperwork which will be entered into your electronic medical record.  These signatures attest to the fact that that the information above on your After Visit Summary has been reviewed and is understood.  Full responsibility of the confidentiality of this discharge information lies with you and/or your care-partner.

## 2023-02-24 NOTE — Progress Notes (Signed)
Called to room to assist during endoscopic procedure.  Patient ID and intended procedure confirmed with present staff. Received instructions for my participation in the procedure from the performing physician.  

## 2023-02-24 NOTE — Op Note (Signed)
[facility] Patient Name: [name] [name] Procedure Date: 02/24/2023 11:03 AM MRN: [id number] Endoscopist: Corliss Parish , MD, [id number] Age: 72 Referring MD:  Date of Birth: [birth year] Gender: Male Account #: [id number] Procedure:                Colonoscopy Indications:              Surveillance: Personal history of adenomatous                            polyps on last colonoscopy 5 years ago Medicines:                Monitored Anesthesia Care Procedure:                Pre-Anesthesia Assessment:                           - Prior to the procedure, a History and Physical                            was performed, and patient medications and                            allergies were reviewed. The patient's tolerance of                            previous anesthesia was also reviewed. The risks                            and benefits of the procedure and the sedation                            options and risks were discussed with the patient.                            All questions were answered, and informed consent                            was obtained. Prior Anticoagulants: The patient has                            taken no anticoagulant or antiplatelet agents                            except for aspirin. ASA Grade Assessment: III - A                            patient with severe systemic disease. After                            reviewing the risks and benefits, the patient was                            deemed in satisfactory condition to undergo the  procedure.                           After obtaining informed consent, the colonoscope                            was passed under direct vision. Throughout the                            procedure, the patient's blood pressure, pulse, and                            oxygen saturations were monitored continuously. The                            Olympus Scope SN: [id number] was introduced  through                            the anus and advanced to the 3 cm into the ileum.                            The colonoscopy was performed without difficulty.                            The patient tolerated the procedure. The quality of                            the bowel preparation was adequate. The terminal                            ileum, ileocecal valve, appendiceal orifice, and                            rectum were photographed. Scope In: 11:18:08 AM Scope Out: 11:38:12 AM Scope Withdrawal Time: 0 hours 15 minutes 27 seconds  Total Procedure Duration: 0 hours 20 minutes 4 seconds  Findings:                 The digital rectal exam findings include                            hemorrhoids. Pertinent negatives include no                            palpable rectal lesions.                           The terminal ileum and ileocecal valve appeared                            normal.                           Eight sessile polyps were found in the  recto-sigmoid colon (1), sigmoid colon (2),                            transverse colon (3), ascending colon (1) and cecum                            (1). The polyps were 2 to 5 mm in size. These                            polyps were removed with a cold snare. Resection                            and retrieval were complete.                           Multiple small-mouthed diverticula were found in                            the recto-sigmoid colon and sigmoid colon.                           Normal mucosa was found in the entire colon                            otherwise.                           Non-bleeding non-thrombosed external and internal                            hemorrhoids were found during retroflexion, during                            perianal exam and during digital exam. The                            hemorrhoids were Grade II (internal hemorrhoids                            that prolapse but  reduce spontaneously). Complications:            No immediate complications. Estimated Blood Loss:     Estimated blood loss was minimal. Impression:               - Hemorrhoids found on digital rectal exam.                           - The examined portion of the ileum was normal.                           - Eight, 2 to 5 mm polyps at the recto-sigmoid                            colon, in the sigmoid colon, in the transverse  colon, in the ascending colon and in the cecum,                            removed with a cold snare. Resected and retrieved.                           - Diverticulosis in the recto-sigmoid colon and in                            the sigmoid colon.                           - Normal mucosa in the entire examined colon                            otherwise.                           - Non-bleeding non-thrombosed external and internal                            hemorrhoids. Recommendation:           - The patient will be observed post-procedure,                            until all discharge criteria are met.                           - Discharge patient to home.                           - Patient has a contact number available for                            emergencies. The signs and symptoms of potential                            delayed complications were discussed with the                            patient. Return to normal activities tomorrow.                            Written discharge instructions were provided to the                            patient.                           - High fiber diet.                           - Use FiberCon 1-2 tablets PO daily.                           - Continue present  medications.                           - Await pathology results.                           - Repeat colonoscopy in 3 - 5 years for                            surveillance based on pathology results.                           -  Recommend Anusol suppositories nightly x 3 nights                            as needed for hemorrhoidal discomfort.                           - Recommend use of wet wipes as needed for wiping.                           - Can discuss consideration of hemorrhoidal                            banding, though in setting of not having                            significant bleeding, not clear that we will get                            the patient to have complete improvement in his                            symptomatology.                           - The findings and recommendations were discussed                            with the patient.                           - The findings and recommendations were discussed                            with the patient's family. Corliss Parish, MD 02/24/2023 11:44:12 AM

## 2023-02-24 NOTE — Progress Notes (Signed)
GASTROENTEROLOGY PROCEDURE H&P NOTE   Primary Care Physician: Nelwyn Salisbury, MD  HPI: [name] [name] is a 72 y.o. male who presents for Colonoscopy for surveillance of previous adenomas.  Past Medical History:  Diagnosis Date   Allergy    Anxiety    Arthritis    Atrial fibrillation (HCC)    sees Dr. Ladona Ridgel   Cataract    forming   Chronic kidney disease    kidney stone   Depression    Diabetes mellitus without complication (HCC)    Dizziness    or vertigo   Dysrhythmia    atrial fibrillation, once or twice a year   GERD (gastroesophageal reflux disease)    Headache    eyglass flicker in eye is migraine symptoms   History of kidney stones    HLD (hyperlipidemia)    HTN (hypertension)    Keratoconus of both eyes    sees Tyrone Schimke MD and Fredrich Birks OD   Osteopenia    Retinal tear    Sinus bradycardia    Sleep apnea    Past Surgical History:  Procedure Laterality Date   CARDIAC CATHETERIZATION  2003   normal   COLONOSCOPY  01/04/2018   per Dr. Russella Dar, adenomatous  polyp, repeat in 5 years   CYSTOSCOPY/URETEROSCOPY/HOLMIUM LASER/STENT PLACEMENT Left 05/16/2021   Procedure: CYSTOSCOPY/URETEROSCOPY/HOLMIUM LASER/STENT PLACEMENT;  Surgeon: Riki Altes, MD;  Location: [facility] ORS;  Service: Urology;  Laterality: Left;   EXTRACORPOREAL SHOCK WAVE LITHOTRIPSY Left 01/21/2018   Procedure: EXTRACORPOREAL SHOCK WAVE LITHOTRIPSY (ESWL);  Surgeon: Vanna Scotland, MD;  Location: [facility] ORS;  Service: Urology;  Laterality: Left;   EYE SURGERY     retinal   POLYPECTOMY     RETINAL LASER PROCEDURE Right    repair retinal tear    WART REMOVAL     LEFT UPPER ARM   WISDOM TOOTH EXTRACTION     Current Outpatient Medications  Medication Sig Dispense Refill   aspirin EC 325 MG tablet Take 1 tablet (325 mg total) by mouth daily. 30 tablet 0   atenolol (TENORMIN) 50 MG tablet Take 1 tablet (50 mg total) by mouth 2 (two) times daily. 180 tablet 3   atorvastatin (LIPITOR) 40  MG tablet Take 1 tablet (40 mg total) by mouth daily. 90 tablet 3   CALCIUM-MAGNESIUM-ZINC PO Take 1 tablet by mouth in the morning.     Cholecalciferol (VITAMIN D3 PO) Take 2,000 Units by mouth in the morning.     famotidine (PEPCID) 40 MG tablet Take 1 tablet (40 mg total) by mouth every evening. 90 tablet 3   flecainide (TAMBOCOR) 100 MG tablet MAY TAKE TWO TABLETS BY MOUTH DAILY AS NEEDED FOR BREAKTHROUGH A-FIB (Patient not taking: Reported on 01/16/2023) 30 tablet 2   Fluocinolone Acetonide (DERMOTIC) 0.01 % OIL Place 1-2 drops in ear(s) 3 (three) times a week. 1-2 gtts to aa ears for psoriasis up to 3 days a week prn flares (Patient not taking: Reported on 01/16/2023) 20 mL 1   fluticasone (FLONASE) 50 MCG/ACT nasal spray Place 2 sprays into both nostrils in the morning.     lisinopril (ZESTRIL) 5 MG tablet Take 1 tablet (5 mg total) by mouth daily. 90 tablet 3   metFORMIN (GLUCOPHAGE) 500 MG tablet Take 1 tablet (500 mg total) by mouth 2 (two) times daily with a meal. 180 tablet 3   mometasone (ELOCON) 0.1 % lotion For psoriasis of the scalp apply to aa's QHS and wash out in  the morning. Use up to 5d/wk PRN. Avoid f/g/a. 60 mL 11   omeprazole (PRILOSEC) 40 MG capsule Take 1 capsule (40 mg total) by mouth every morning. 90 capsule 3   Roflumilast (ZORYVE) 0.3 % CREA Apply to aa's psoriasis QD PRN. 60 g 6   sildenafil (REVATIO) 20 MG tablet TAKE 5 TABLETS BY MOUTH AS NEEDED 150 tablet 0   SV IRON 325 (65 Fe) MG tablet Take 1 tablet by mouth once daily 90 tablet 0   venlafaxine XR (EFFEXOR-XR) 37.5 MG 24 hr capsule TAKE 1 CAPSULE BY MOUTH ONCE DAILY WITH BREAKFAST 90 capsule 0   No current facility-administered medications for this visit.    Current Outpatient Medications:    aspirin EC 325 MG tablet, Take 1 tablet (325 mg total) by mouth daily., Disp: 30 tablet, Rfl: 0   atenolol (TENORMIN) 50 MG tablet, Take 1 tablet (50 mg total) by mouth 2 (two) times daily., Disp: 180 tablet, Rfl: 3    atorvastatin (LIPITOR) 40 MG tablet, Take 1 tablet (40 mg total) by mouth daily., Disp: 90 tablet, Rfl: 3   CALCIUM-MAGNESIUM-ZINC PO, Take 1 tablet by mouth in the morning., Disp: , Rfl:    Cholecalciferol (VITAMIN D3 PO), Take 2,000 Units by mouth in the morning., Disp: , Rfl:    famotidine (PEPCID) 40 MG tablet, Take 1 tablet (40 mg total) by mouth every evening., Disp: 90 tablet, Rfl: 3   flecainide (TAMBOCOR) 100 MG tablet, MAY TAKE TWO TABLETS BY MOUTH DAILY AS NEEDED FOR BREAKTHROUGH A-FIB (Patient not taking: Reported on 01/16/2023), Disp: 30 tablet, Rfl: 2   Fluocinolone Acetonide (DERMOTIC) 0.01 % OIL, Place 1-2 drops in ear(s) 3 (three) times a week. 1-2 gtts to aa ears for psoriasis up to 3 days a week prn flares (Patient not taking: Reported on 01/16/2023), Disp: 20 mL, Rfl: 1   fluticasone (FLONASE) 50 MCG/ACT nasal spray, Place 2 sprays into both nostrils in the morning., Disp: , Rfl:    lisinopril (ZESTRIL) 5 MG tablet, Take 1 tablet (5 mg total) by mouth daily., Disp: 90 tablet, Rfl: 3   metFORMIN (GLUCOPHAGE) 500 MG tablet, Take 1 tablet (500 mg total) by mouth 2 (two) times daily with a meal., Disp: 180 tablet, Rfl: 3   mometasone (ELOCON) 0.1 % lotion, For psoriasis of the scalp apply to aa's QHS and wash out in the morning. Use up to 5d/wk PRN. Avoid f/g/a., Disp: 60 mL, Rfl: 11   omeprazole (PRILOSEC) 40 MG capsule, Take 1 capsule (40 mg total) by mouth every morning., Disp: 90 capsule, Rfl: 3   Roflumilast (ZORYVE) 0.3 % CREA, Apply to aa's psoriasis QD PRN., Disp: 60 g, Rfl: 6   sildenafil (REVATIO) 20 MG tablet, TAKE 5 TABLETS BY MOUTH AS NEEDED, Disp: 150 tablet, Rfl: 0   SV IRON 325 (65 Fe) MG tablet, Take 1 tablet by mouth once daily, Disp: 90 tablet, Rfl: 0   venlafaxine XR (EFFEXOR-XR) 37.5 MG 24 hr capsule, TAKE 1 CAPSULE BY MOUTH ONCE DAILY WITH BREAKFAST, Disp: 90 capsule, Rfl: 0 Allergies  Allergen Reactions   Oxycodone Nausea And Vomiting   Paroxetine Nausea And  Vomiting and Other (See Comments)    Altered mental state   Sulfa Antibiotics Hives   Family History  Problem Relation Age of Onset   Hodgkin's lymphoma Father    Hyperlipidemia Other    Hypertension Mother    Stroke Mother    Heart attack Brother    Prostate cancer Brother  1st degree relative <50   Diabetes Brother        1st degree relative   Multiple myeloma Brother    Hypertension Brother    Heart disease Brother    Heart attack Brother    Colon polyps Brother    Colon cancer Neg Hx    Esophageal cancer Neg Hx    Rectal cancer Neg Hx    Stomach cancer Neg Hx    Social History   Socioeconomic History   Marital status: Married    Spouse name: nancy   Number of children: Not on file   Years of education: Not on file   Highest education level: Master's degree (e.g., MA, MS, MEng, MEd, MSW, MBA)  Occupational History   Occupation: Control and instrumentation engineer for [employer]    Comment: retired  Tobacco Use   Smoking status: Former    Current packs/day: 0.00    Types: Cigarettes    Quit date: 03/11/1975    Years since quitting: 47.9   Smokeless tobacco: Never  Vaping Use   Vaping status: Never Used  Substance and Sexual Activity   Alcohol use: Yes    Alcohol/week: 0.0 standard drinks of alcohol    Comment: couple times a month-SOCIALLY 1 BEER A MONTH   Drug use: No   Sexual activity: Yes    Birth control/protection: None  Other Topics Concern   Not on file  Social History Narrative   Occupation: Airline pilot.    Social Drivers of Corporate investment banker Strain: Low Risk  (10/01/2021)   Overall Financial Resource Strain (CARDIA)    Difficulty of Paying Living Expenses: Not hard at all  Food Insecurity: No Food Insecurity (10/01/2021)   Hunger Vital Sign    Worried About Running Out of Food in the Last Year: Never true    Ran Out of Food in the Last Year: Never true  Transportation Needs: No Transportation Needs (10/01/2021)   PRAPARE - Scientist, research (physical sciences) (Medical): No    Lack of Transportation (Non-Medical): No  Physical Activity: Sufficiently Active (10/01/2021)   Exercise Vital Sign    Days of Exercise per Week: 4 days    Minutes of Exercise per Session: 40 min  Stress: Stress Concern Present (10/01/2021)   [employer] of Occupational Health - Occupational Stress Questionnaire    Feeling of Stress : To some extent  Social Connections: Socially Integrated (10/01/2021)   Social Connection and Isolation Panel [NHANES]    Frequency of Communication with Friends and Family: More than three times a week    Frequency of Social Gatherings with Friends and Family: More than three times a week    Attends Religious Services: More than 4 times per year    Active Member of [employer] or Organizations: Yes    Attends Engineer, structural: More than 4 times per year    Marital Status: Married  Catering manager Violence: Not At Risk (05/22/2021)   Humiliation, Afraid, Rape, and Kick questionnaire    Fear of Current or Ex-Partner: No    Emotionally Abused: No    Physically Abused: No    Sexually Abused: No    Physical Exam: There were no vitals filed for this visit. There is no height or weight on file to calculate BMI. GEN: NAD EYE: Sclerae anicteric ENT: MMM CV: Non-tachycardic GI: Soft, NT/ND NEURO:  Alert & Oriented x 3  Lab Results: No results for input(s): "WBC", "HGB", "HCT", "PLT" in the last  72 hours. BMET No results for input(s): "NA", "K", "CL", "CO2", "GLUCOSE", "BUN", "CREATININE", "CALCIUM" in the last 72 hours. LFT No results for input(s): "PROT", "ALBUMIN", "AST", "ALT", "ALKPHOS", "BILITOT", "BILIDIR", "IBILI" in the last 72 hours. PT/INR No results for input(s): "LABPROT", "INR" in the last 72 hours.   Impression / Plan: This is a 72 y.o.male who presents for Colonoscopy for surveillance of previous adenomas.  The risks and benefits of endoscopic evaluation/treatment were discussed with the  patient and/or family; these include but are not limited to the risk of perforation, infection, bleeding, missed lesions, lack of diagnosis, severe illness requiring hospitalization, as well as anesthesia and sedation related illnesses.  The patient's history has been reviewed, patient examined, no change in status, and deemed stable for procedure.  The patient and/or family is agreeable to proceed.    Corliss Parish, MD [facility] Office # [phone]

## 2023-02-24 NOTE — Progress Notes (Signed)
A/O x 3, gd SR's, VSS, report to RN

## 2023-02-25 ENCOUNTER — Telehealth: Payer: Self-pay | Admitting: *Deleted

## 2023-02-25 NOTE — Telephone Encounter (Signed)
  Follow up Call-     02/24/2023   10:54 AM  Call back number  Post procedure Call Back phone  # [phone]  Permission to leave phone message Yes     Patient questions:  Do you have a fever, pain , or abdominal swelling? No. Pain Score  0 *  Have you tolerated food without any problems? Yes.    Have you been able to return to your normal activities? Yes.    Do you have any questions about your discharge instructions: Diet   No. Medications  No. Follow up visit  No.  Do you have questions or concerns about your Care? No.  Actions: * If pain score is 4 or above: No action needed, pain <4.

## 2023-02-27 LAB — SURGICAL PATHOLOGY

## 2023-02-28 ENCOUNTER — Encounter: Payer: Self-pay | Admitting: Gastroenterology

## 2023-03-11 ENCOUNTER — Other Ambulatory Visit: Payer: Self-pay | Admitting: Family Medicine

## 2023-04-02 ENCOUNTER — Ambulatory Visit: Payer: Medicare HMO | Admitting: Physician Assistant

## 2023-06-09 ENCOUNTER — Other Ambulatory Visit: Payer: Self-pay | Admitting: Family Medicine

## 2023-07-15 DIAGNOSIS — Z961 Presence of intraocular lens: Secondary | ICD-10-CM | POA: Diagnosis not present

## 2023-07-15 DIAGNOSIS — H18413 Arcus senilis, bilateral: Secondary | ICD-10-CM | POA: Diagnosis not present

## 2023-07-15 DIAGNOSIS — H26492 Other secondary cataract, left eye: Secondary | ICD-10-CM | POA: Diagnosis not present

## 2023-07-15 DIAGNOSIS — H26491 Other secondary cataract, right eye: Secondary | ICD-10-CM | POA: Diagnosis not present

## 2023-07-15 DIAGNOSIS — H35371 Puckering of macula, right eye: Secondary | ICD-10-CM | POA: Diagnosis not present

## 2023-07-29 DIAGNOSIS — H26492 Other secondary cataract, left eye: Secondary | ICD-10-CM | POA: Diagnosis not present

## 2023-08-18 DIAGNOSIS — K080 Exfoliation of teeth due to systemic causes: Secondary | ICD-10-CM | POA: Diagnosis not present

## 2023-09-08 ENCOUNTER — Ambulatory Visit (INDEPENDENT_AMBULATORY_CARE_PROVIDER_SITE_OTHER): Admitting: Family Medicine

## 2023-09-08 DIAGNOSIS — Z0000 Encounter for general adult medical examination without abnormal findings: Secondary | ICD-10-CM | POA: Diagnosis not present

## 2023-09-08 NOTE — Patient Instructions (Signed)
 I really enjoyed getting to talk with you today! I am available on Tuesdays and Thursdays for virtual visits if you have any questions or concerns, or if I can be of any further assistance.   CHECKLIST FROM ANNUAL WELLNESS VISIT:  -Follow up (please call to schedule if not scheduled after visit):   -yearly for annual wellness visit with primary care office  Here is a list of your preventive care/health maintenance measures and the plan for each if any are due:  PLAN For any measures below that may be due:    1. Schedule annual in office visit for you foot exam and labs and follow up with Dr. Johnny   2. Please ask your eye doctor to send the diabetic eye exam report to Dr. Johnny   3. Can get vaccines at the pharmacy, please let us  know if you do so that we can update your record.   Health Maintenance  Topic Date Due   COVID-19 Vaccine (1) Never done   Diabetic kidney evaluation - Urine ACR  Never done   Hepatitis C Screening  Never done   DTaP/Tdap/Td (1 - Tdap) Never done   Pneumococcal Vaccine: 50+ Years (1 of 2 - PCV) Never done   Zoster Vaccines- Shingrix (1 of 2) Never done   FOOT EXAM  05/16/2021   OPHTHALMOLOGY EXAM  08/28/2022   HEMOGLOBIN A1C  03/22/2023   Diabetic kidney evaluation - eGFR measurement  09/19/2023   INFLUENZA VACCINE  10/09/2023   Medicare Annual Wellness (AWV)  09/07/2024   Colonoscopy  02/23/2026   Hepatitis B Vaccines  Aged Out   HPV VACCINES  Aged Out   Meningococcal B Vaccine  Aged Out    -See a dentist at least yearly  -Get your eyes checked and then per your eye specialist's recommendations  -Other issues addressed today:   -I have included below further information regarding a healthy whole foods based diet, physical activity guidelines for adults, stress management and opportunities for social connections. I hope you find this information useful.    -----------------------------------------------------------------------------------------------------------------------------------------------------------------------------------------------------------------------------------------------------------    NUTRITION: -eat real food: lots of colorful vegetables (half the plate) and fruits -5-7 servings of vegetables and fruits per day (fresh or steamed is best), exp. 2 servings of vegetables with lunch and dinner and 2 servings of fruit per day. Berries and greens such as kale and collards are great choices.  -consume on a regular basis:  fresh fruits, fresh veggies, fish, nuts, seeds, healthy oils (such as olive oil, avocado oil), whole grains (make sure for bread/pasta/crackers/etc., that the first ingredient on label contains the word whole), legumes. -can eat small amounts of dairy and lean meat (no larger than the palm of your hand), but avoid processed meats such as ham, bacon, lunch meat, etc. -drink water -try to avoid fast food and pre-packaged foods, processed meat, ultra processed foods/beverages (donuts, candy, etc.) -most experts advise limiting sodium to < 2300mg  per day, should limit further is any chronic conditions such as high blood pressure, heart disease, diabetes, etc. The American Heart Association advised that < 1500mg  is is ideal -try to avoid foods/beverages that contain any ingredients with names you do not recognize  -try to avoid foods/beverages  with added sugar or sweeteners/sweets  -try to avoid sweet drinks (including diet drinks): soda, juice, Gatorade, sweet tea, power drinks, diet drinks -try to avoid white rice, white bread, pasta (unless whole grain)  EXERCISE GUIDELINES FOR ADULTS: -if you wish to increase your physical  activity, do so gradually and with the approval of your doctor -STOP and seek medical care immediately if you have any chest pain, chest discomfort or trouble breathing when starting or  increasing exercise  -move and stretch your body, legs, feet and arms when sitting for long periods -Physical activity guidelines for optimal health in adults: -get at least 150 minutes per week of moderate exercise (can talk, but not sing); this is about 20-30 minutes of sustained activity 5-7 days per week or two 10-15 minute episodes of sustained activity 5-7 days per week -do some muscle building/resistance training/strength training at least 2 days per week  -balance exercises 3+ days per week:   Stand somewhere where you have something sturdy to hold onto if you lose balance    1) lift up on toes, then back down, start with 5x per day and work up to 20x   2) stand and lift one leg straight out to the side so that foot is a few inches of the floor, start with 5x each side and work up to 20x each side   3) stand on one foot, start with 5 seconds each side and work up to 20 seconds on each side  If you need ideas or help with getting more active:  -Silver sneakers https://tools.silversneakers.com  -Walk with a Doc: http://www.duncan-williams.com/  -try to include resistance (weight lifting/strength building) and balance exercises twice per week: or the following link for ideas: http://castillo-powell.com/  BuyDucts.dk  STRESS MANAGEMENT: -can try meditating, or just sitting quietly with deep breathing while intentionally relaxing all parts of your body for 5 minutes daily -if you need further help with stress, anxiety or depression please follow up with your primary doctor or contact the wonderful folks at [facility]: [phone]  SOCIAL CONNECTIONS: -options in [place] if you wish to engage in more social and exercise related activities:  -Silver sneakers https://tools.silversneakers.com  -Walk with a Doc: http://www.duncan-williams.[email or web address]/  -Check out the [facility] Active Adults 50+  section on the [place] [employer] (hiking clubs, book clubs, cards and games, chess, exercise classes, aquatic classes and much more) - see the website for details: https://www.[place].gov/departments/parks-recreation/active-adults50  -YouTube has lots of exercise videos for different ages and abilities as well  -[facility] (a variety of indoor and outdoor inperson activities for adults). [phone]. [address]  -Virtual Online Classes (a variety of topics): see seniorplanet.org or call [phone]  -consider volunteering at a school, hospice center, church, senior center or elsewhere    ADVANCED HEALTHCARE DIRECTIVES:  [place] Advanced Directives assistance:   ExpressWeek.com.cy  Everyone should have advanced health care directives in place. This is so that you get the care you want, should you ever be in a situation where you are unable to make your own medical decisions.   From the  Advanced Directive Website: Advance Health Care Directives are legal documents in which you give written instructions about your health care if, in the future, you cannot speak for yourself.   A health care power of attorney allows you to name a person you trust to make your health care decisions if you cannot make them yourself. A declaration of a desire for a natural death (or living will) is document, which states that you desire not to have your life prolonged by extraordinary measures if you have a terminal or incurable illness or if you are in a vegetative state. An advance instruction for mental health treatment makes a declaration of instructions, information  and preferences regarding your mental health treatment. It also states that you are aware that the advance instruction authorizes a mental health treatment provider to act according to your wishes. It may also outline your consent or refusal of  mental health treatment. A declaration of an anatomical gift allows anyone over the age of 68 to make a gift by will, organ donor card or other document.   Please see the following website or an elder law attorney for forms, FAQs and for completion of advanced directives: [place]  Print production planner Health Care Directives Advance Health Care Directives ([email or web address]/)  Or copy and paste the following to your web browser: [email or web address]

## 2023-09-08 NOTE — Progress Notes (Signed)
 PATIENT CHECK-IN and HEALTH RISK ASSESSMENT QUESTIONNAIRE:  -completed by phone/video for upcoming Medicare Preventive Visit  Pre-Visit Check-in: 1)Vitals (height, wt, BP, etc) - record in vitals section for visit on day of visit Request home vitals (wt, BP, etc.) and enter into vitals, THEN update Vital Signs SmartPhrase below at the top of the HPI. See below.  2)Review and Update Medications, Allergies PMH, Surgeries, Social history in Epic 3)Hospitalizations in the last year with date/reason? n  4)Review and Update Care Team (patient's specialists) in Epic 5) Complete PHQ9 in Epic  6) Complete Fall Screening in Epic 7)Review all Health Maintenance Due and order under PCP if not done.  Medicare Wellness Patient Questionnaire:  Answer theses question about your habits: How often do you have a drink containing alcohol?monthly or less How many drinks containing alcohol do you have on a typical day when you are drinking? 1-2 How often do you have six or more drinks on one occasion?never Have you ever smoked?2 years in college How many packs a day do/did you smoke? n Do you use smokeless tobacco?n Do you use an illicit drugs?n On average, how many days per week do you engage in moderate to strenuous exercise (like a brisk walk)?5 days per week On average, how many minutes do you engage in exercise at this level? 30 minutes Typical breakfast: bowl of rice krispies with stevia Typical lunch: sandwich with chips Typical dinner: chicken and veggies, sometimes eats out Typical snacks: apples, bananas, cookies  Beverages: water, one diet coke per day Social: clubs and events more than 4x per year, talks with friends/family more than 3 times per week, attend church and religious services  Answer theses question about your everyday activities: Can you perform most household chores?y Are you deaf or have significant trouble hearing?some trouble with R ear Do you feel that you have a problem  with memory?n Do you feel safe at home?y Last dentist visit? Goes on regular  8. Do you have any difficulty performing your everyday activities?n Are you having any difficulty walking, taking medications on your own, and or difficulty managing daily home needs?n Do you have difficulty walking or climbing stairs?n Do you have difficulty dressing or bathing?n Do you have difficulty doing errands alone such as visiting a doctor's office or shopping?n Do you currently have any difficulty preparing food and eating?n Do you currently have any difficulty using the toilet?n Do you have any difficulty managing your finances?n Do you have any difficulties with housekeeping of managing your housekeeping?n   Last eye Exam and location? Will be seeing corneal specialist on Monday, Dr. Leane, reports will schedule diabetic eye exam   Do you currently use prescribed or non-prescribed narcotic or opioid pain medications?n  Do you have a history or close family history of breast, ovarian, tubal or peritoneal cancer or a family member with BRCA (breast cancer susceptibility 1 and 2) gene mutations? Father had lymphona    ----------------------------------------------------------------------------------------------------------------------------------------------------------------------------------------------------------------------  Because this visit was a virtual/telehealth visit, some criteria may be missing or patient reported. Any vitals not documented were not able to be obtained and vitals that have been documented are patient reported.    MEDICARE ANNUAL PREVENTIVE CARE VISIT WITH PROVIDER (Welcome to Medicare, initial annual wellness or annual wellness exam)  Virtual Visit via Video Note  I connected with [name] [name] on 09/08/23  by a video enabled telemedicine application and verified that I am speaking with the correct person using two identifiers.  Location patient: home  Location  provider:work or home office Persons participating in the virtual visit: patient, provider  Concerns and/or follow up today: doing great.   See HM section in Epic for other details of completed HM.    ROS: negative for report of fevers, unintentional weight loss, vision changes, vision loss, hearing loss or change, chest pain, sob, hemoptysis, melena, hematochezia, hematuria, falls, bleeding or bruising, thoughts of suicide or self harm, memory loss  Patient-completed extensive health risk assessment - reviewed and discussed with the patient: See Health Risk Assessment completed with patient prior to the visit either above or in recent phone note. This was reviewed in detailed with the patient today and appropriate recommendations, orders and referrals were placed as needed per Summary below and patient instructions.   Review of Medical History: -PMH, PSH, Family History and current specialty and care providers reviewed and updated and listed below   Patient Care Team: Johnny Garnette LABOR, MD as PCP - General   Past Medical History:  Diagnosis Date   Allergy    Anxiety    Arthritis    Atrial fibrillation [facility])    sees Dr. Waddell   Cataract    forming   Chronic kidney disease    kidney stone   Depression    Diabetes mellitus without complication (HCC)    Dizziness    or vertigo   Dysrhythmia    atrial fibrillation, once or twice a year   GERD (gastroesophageal reflux disease)    Headache    eyglass flicker in eye is migraine symptoms   History of kidney stones    HLD (hyperlipidemia)    HTN (hypertension)    Keratoconus of both eyes    sees Helene Deacon MD and Thom Hamilton OD   Osteopenia    Retinal tear    Sinus bradycardia    Sleep apnea     Past Surgical History:  Procedure Laterality Date   CARDIAC CATHETERIZATION  2003   normal   COLONOSCOPY  01/04/2018   per Dr. Aneita, adenomatous  polyp, repeat in 5 years   CYSTOSCOPY/URETEROSCOPY/HOLMIUM LASER/STENT  PLACEMENT Left 05/16/2021   Procedure: CYSTOSCOPY/URETEROSCOPY/HOLMIUM LASER/STENT PLACEMENT;  Surgeon: Twylla Hamilton BROCKS, MD;  Location: [facility] ORS;  Service: Urology;  Laterality: Left;   EXTRACORPOREAL SHOCK WAVE LITHOTRIPSY Left 01/21/2018   Procedure: EXTRACORPOREAL SHOCK WAVE LITHOTRIPSY (ESWL);  Surgeon: Penne Knee, MD;  Location: [facility] ORS;  Service: Urology;  Laterality: Left;   EYE SURGERY     retinal   POLYPECTOMY     RETINAL LASER PROCEDURE Right    repair retinal tear    WART REMOVAL     LEFT UPPER ARM   WISDOM TOOTH EXTRACTION      Social History   Socioeconomic History   Marital status: Married    Spouse name: nancy   Number of children: Not on file   Years of education: Not on file   Highest education level: Master's degree (e.g., MA, MS, MEng, MEd, MSW, MBA)  Occupational History   Occupation: Control and instrumentation engineer for [employer]    Comment: retired  Tobacco Use   Smoking status: Former    Current packs/day: 0.00    Types: Cigarettes    Quit date: 03/11/1975    Years since quitting: 48.5   Smokeless tobacco: Never  Vaping Use   Vaping status: Never Used  Substance and Sexual Activity   Alcohol use: Yes    Alcohol/week: 0.0 standard drinks of alcohol    Comment: couple times a month-SOCIALLY 1  BEER A MONTH   Drug use: No   Sexual activity: Yes    Birth control/protection: None  Other Topics Concern   Not on file  Social History Narrative   Occupation: Airline pilot.    Social Drivers of Corporate investment banker Strain: Low Risk  (09/07/2023)   Overall Financial Resource Strain (CARDIA)    Difficulty of Paying Living Expenses: Not hard at all  Food Insecurity: No Food Insecurity (09/07/2023)   Hunger Vital Sign    Worried About Running Out of Food in the Last Year: Never true    Ran Out of Food in the Last Year: Never true  Transportation Needs: No Transportation Needs (09/07/2023)   PRAPARE - Administrator, Civil Service (Medical): No    Lack of  Transportation (Non-Medical): No  Physical Activity: Sufficiently Active (09/07/2023)   Exercise Vital Sign    Days of Exercise per Week: 5 days    Minutes of Exercise per Session: 30 min  Stress: No Stress Concern Present (09/07/2023)   [employer] of Occupational Health - Occupational Stress Questionnaire    Feeling of Stress: Not at all  Social Connections: Socially Integrated (09/07/2023)   Social Connection and Isolation Panel    Frequency of Communication with Friends and Family: More than three times a week    Frequency of Social Gatherings with Friends and Family: More than three times a week    Attends Religious Services: More than 4 times per year    Active Member of [employer] or Organizations: Yes    Attends Engineer, structural: More than 4 times per year    Marital Status: Married  Catering manager Violence: Not At Risk (05/22/2021)   Humiliation, Afraid, Rape, and Kick questionnaire    Fear of Current or Ex-Partner: No    Emotionally Abused: No    Physically Abused: No    Sexually Abused: No    Family History  Problem Relation Age of Onset   Hodgkin's lymphoma Father    Hyperlipidemia Other    Hypertension Mother    Stroke Mother    Heart attack Brother    Prostate cancer Brother        1st degree relative <50   Diabetes Brother        1st degree relative   Multiple myeloma Brother    Hypertension Brother    Heart disease Brother    Heart attack Brother    Colon polyps Brother    Colon cancer Neg Hx    Esophageal cancer Neg Hx    Rectal cancer Neg Hx    Stomach cancer Neg Hx     Current Outpatient Medications on File Prior to Visit  Medication Sig Dispense Refill   aspirin  EC 325 MG tablet Take 1 tablet (325 mg total) by mouth daily. 30 tablet 0   atenolol  (TENORMIN ) 50 MG tablet Take 1 tablet (50 mg total) by mouth 2 (two) times daily. 180 tablet 3   atorvastatin  (LIPITOR) 40 MG tablet Take 1 tablet (40 mg total) by mouth daily. 90 tablet 3    CALCIUM -MAGNESIUM-ZINC PO Take 1 tablet by mouth in the morning.     Cholecalciferol (VITAMIN D3 PO) Take 2,000 Units by mouth in the morning.     famotidine  (PEPCID ) 40 MG tablet Take 1 tablet (40 mg total) by mouth every evening. 90 tablet 3   FEROSUL 325 (65 Fe) MG tablet Take 1 tablet by mouth once daily 90 tablet 0  flecainide  (TAMBOCOR ) 100 MG tablet MAY TAKE TWO TABLETS BY MOUTH DAILY AS NEEDED FOR BREAKTHROUGH A-FIB (Patient not taking: Reported on 01/16/2023) 30 tablet 2   Fluocinolone  Acetonide (DERMOTIC ) 0.01 % OIL Place 1-2 drops in ear(s) 3 (three) times a week. 1-2 gtts to aa ears for psoriasis up to 3 days a week prn flares (Patient not taking: Reported on 01/16/2023) 20 mL 1   fluticasone (FLONASE) 50 MCG/ACT nasal spray Place 2 sprays into both nostrils in the morning.     lisinopril  (ZESTRIL ) 5 MG tablet Take 1 tablet (5 mg total) by mouth daily. 90 tablet 3   metFORMIN  (GLUCOPHAGE ) 500 MG tablet Take 1 tablet (500 mg total) by mouth 2 (two) times daily with a meal. 180 tablet 3   mometasone  (ELOCON ) 0.1 % lotion For psoriasis of the scalp apply to aa's QHS and wash out in the morning. Use up to 5d/wk PRN. Avoid f/g/a. 60 mL 11   omeprazole  (PRILOSEC) 40 MG capsule Take 1 capsule (40 mg total) by mouth every morning. 90 capsule 3   Roflumilast  (ZORYVE ) 0.3 % CREA Apply to aa's psoriasis QD PRN. 60 g 6   sildenafil  (REVATIO ) 20 MG tablet TAKE 5 TABLETS BY MOUTH AS NEEDED 150 tablet 0   venlafaxine  XR (EFFEXOR -XR) 37.5 MG 24 hr capsule TAKE 1 CAPSULE BY MOUTH ONCE DAILY WITH BREAKFAST 90 capsule 0   No current facility-administered medications on file prior to visit.    Allergies  Allergen Reactions   Oxycodone Nausea And Vomiting   Paroxetine Nausea And Vomiting and Other (See Comments)    Altered mental state   Sulfa Antibiotics Hives       Physical Exam Vitals requested from patient and listed below if patient had equipment and was able to obtain at home for this  virtual visit: There were no vitals filed for this visit. Estimated body mass index is 29.7 kg/m as calculated from the following:   Height as of 02/24/23: 6' (1.829 m).   Weight as of 02/24/23: 219 lb (99.3 kg).  EKG (optional): deferred due to virtual visit  GENERAL: alert, oriented, no acute distress detected; full vision exam deferred due to pandemic and/or virtual encounter  HEENT: atraumatic, conjunttiva clear, no obvious abnormalities on inspection of external nose and ears  NECK: normal movements of the head and neck  LUNGS: on inspection no signs of respiratory distress, breathing rate appears normal, no obvious gross SOB, gasping or wheezing  CV: no obvious cyanosis  MS: moves all visible extremities without noticeable abnormality  PSYCH/NEURO: pleasant and cooperative, no obvious depression or anxiety, speech and thought processing grossly intact, Cognitive function grossly intact  Flowsheet Row Video Visit from 06/19/2022 in [facility]  PHQ-9 Total Score 0        09/08/2023    3:41 PM 06/19/2022    3:05 PM 04/28/2022    9:59 AM 10/02/2021    3:43 PM 09/26/2021   11:13 AM  Depression screen PHQ 2/9  Decreased Interest 0 0 1 0 0  Down, Depressed, Hopeless 0 0 1 0 0  PHQ - 2 Score 0 0 2 0 0  Altered sleeping  0 1 1 3   Tired, decreased energy  0 1 2 2   Change in appetite  0 1 0 0  Feeling bad or failure about yourself   0 1 0 0  Trouble concentrating  0 0 0   Moving slowly or fidgety/restless  0 0 0  0  Suicidal thoughts  0 0 0 0  PHQ-9 Score  0 6 3 5   Difficult doing work/chores  Not difficult at all Not difficult at all Not difficult at all Somewhat difficult       09/26/2021   11:13 AM 10/02/2021    3:43 PM 04/28/2022    9:59 AM 06/19/2022    3:06 PM 09/08/2023    3:41 PM  Fall Risk  Falls in the past year? 0 0 0 0 0  Was there an injury with Fall? 0 0 0 0 0  Fall Risk Category Calculator 0 0 0 0 0  Fall Risk Category  (Retired) Low  Low      (RETIRED) Patient Fall Risk Level Low fall risk  Low fall risk      Patient at Risk for Falls Due to No Fall Risks No Fall Risks No Fall Risks No Fall Risks   Fall risk Follow up Falls evaluation completed  Falls evaluation completed  Falls evaluation completed Falls evaluation completed      Data saved with a previous flowsheet row definition     SUMMARY AND PLAN:  Encounter for Medicare annual wellness exam   Discussed applicable health maintenance/preventive health measures and advised and referred or ordered per patient preferences: -discussed vaccines due, advised can get at the pharmacy and to let us  know if he does -he plans to see PCP for labs and foot exam -says had diabetic eye exam, ased that he have his eye doc send the report to Dr. Johnny Health Maintenance  Topic Date Due   COVID-19 Vaccine (1) Never done   Diabetic kidney evaluation - Urine ACR  Never done   Hepatitis C Screening  Never done   DTaP/Tdap/Td (1 - Tdap) Never done   Pneumococcal Vaccine: 50+ Years (1 of 2 - PCV) Never done   Zoster Vaccines- Shingrix (1 of 2) Never done   FOOT EXAM  05/16/2021   OPHTHALMOLOGY EXAM  08/28/2022   HEMOGLOBIN A1C  03/22/2023   Diabetic kidney evaluation - eGFR measurement  09/19/2023   INFLUENZA VACCINE  10/09/2023   Medicare Annual Wellness (AWV)  09/07/2024   Colonoscopy  02/23/2026   Hepatitis B Vaccines  Aged Out   HPV VACCINES  Aged Out   Meningococcal B Vaccine  Aged [employer] and counseling on the following was provided based on the above review of health and a plan/checklist for the patient, along with additional information discussed, was provided for the patient in the patient instructions :  -Provided information for ompleting advanced directives - see patient instructions -he is considering audiology eval for the hearing -Advised and counseled on a healthy lifestyle  -Reviewed patient's current diet. Advised and counseled  on a whole foods based healthy diet. A summary of a healthy diet was provided in the Patient Instructions.  -reviewed patient's current physical activity level and discussed exercise guidelines for adults. Discussed community resources and ideas for safe exercise at home to assist in meeting exercise guideline recommendations in a safe and healthy way.  -Advise yearly dental visits at minimum and regular eye exams   Follow up: see patient instructions   Patient Instructions  I really enjoyed getting to talk with you today! I am available on Tuesdays and Thursdays for virtual visits if you have any questions or concerns, or if I can be of any further assistance.   CHECKLIST FROM ANNUAL WELLNESS VISIT:  -Follow up (please call to  schedule if not scheduled after visit):   -yearly for annual wellness visit with primary care office  Here is a list of your preventive care/health maintenance measures and the plan for each if any are due:  PLAN For any measures below that may be due:    1. Schedule annual in office visit for you foot exam and labs and follow up with Dr. Johnny   2. Please ask your eye doctor to send the diabetic eye exam report to Dr. Johnny   3. Can get vaccines at the pharmacy, please let us  know if you do so that we can update your record.   Health Maintenance  Topic Date Due   COVID-19 Vaccine (1) Never done   Diabetic kidney evaluation - Urine ACR  Never done   Hepatitis C Screening  Never done   DTaP/Tdap/Td (1 - Tdap) Never done   Pneumococcal Vaccine: 50+ Years (1 of 2 - PCV) Never done   Zoster Vaccines- Shingrix (1 of 2) Never done   FOOT EXAM  05/16/2021   OPHTHALMOLOGY EXAM  08/28/2022   HEMOGLOBIN A1C  03/22/2023   Diabetic kidney evaluation - eGFR measurement  09/19/2023   INFLUENZA VACCINE  10/09/2023   Medicare Annual Wellness (AWV)  09/07/2024   Colonoscopy  02/23/2026   Hepatitis B Vaccines  Aged Out   HPV VACCINES  Aged Out   Meningococcal B Vaccine   Aged Out    -See a dentist at least yearly  -Get your eyes checked and then per your eye specialist's recommendations  -Other issues addressed today:   -I have included below further information regarding a healthy whole foods based diet, physical activity guidelines for adults, stress management and opportunities for social connections. I hope you find this information useful.   -----------------------------------------------------------------------------------------------------------------------------------------------------------------------------------------------------------------------------------------------------------    NUTRITION: -eat real food: lots of colorful vegetables (half the plate) and fruits -5-7 servings of vegetables and fruits per day (fresh or steamed is best), exp. 2 servings of vegetables with lunch and dinner and 2 servings of fruit per day. Berries and greens such as kale and collards are great choices.  -consume on a regular basis:  fresh fruits, fresh veggies, fish, nuts, seeds, healthy oils (such as olive oil, avocado oil), whole grains (make sure for bread/pasta/crackers/etc., that the first ingredient on label contains the word whole), legumes. -can eat small amounts of dairy and lean meat (no larger than the palm of your hand), but avoid processed meats such as ham, bacon, lunch meat, etc. -drink water -try to avoid fast food and pre-packaged foods, processed meat, ultra processed foods/beverages (donuts, candy, etc.) -most experts advise limiting sodium to < 2300mg  per day, should limit further is any chronic conditions such as high blood pressure, heart disease, diabetes, etc. The American Heart Association advised that < 1500mg  is is ideal -try to avoid foods/beverages that contain any ingredients with names you do not recognize  -try to avoid foods/beverages  with added sugar or sweeteners/sweets  -try to avoid sweet drinks (including diet drinks):  soda, juice, Gatorade, sweet tea, power drinks, diet drinks -try to avoid white rice, white bread, pasta (unless whole grain)  EXERCISE GUIDELINES FOR ADULTS: -if you wish to increase your physical activity, do so gradually and with the approval of your doctor -STOP and seek medical care immediately if you have any chest pain, chest discomfort or trouble breathing when starting or increasing exercise  -move and stretch your body, legs, feet and arms when sitting for long  periods -Physical activity guidelines for optimal health in adults: -get at least 150 minutes per week of moderate exercise (can talk, but not sing); this is about 20-30 minutes of sustained activity 5-7 days per week or two 10-15 minute episodes of sustained activity 5-7 days per week -do some muscle building/resistance training/strength training at least 2 days per week  -balance exercises 3+ days per week:   Stand somewhere where you have something sturdy to hold onto if you lose balance    1) lift up on toes, then back down, start with 5x per day and work up to 20x   2) stand and lift one leg straight out to the side so that foot is a few inches of the floor, start with 5x each side and work up to 20x each side   3) stand on one foot, start with 5 seconds each side and work up to 20 seconds on each side  If you need ideas or help with getting more active:  -Silver sneakers https://tools.silversneakers.com  -Walk with a Doc: http://www.duncan-williams.com/  -try to include resistance (weight lifting/strength building) and balance exercises twice per week: or the following link for ideas: http://castillo-powell.com/  BuyDucts.dk  STRESS MANAGEMENT: -can try meditating, or just sitting quietly with deep breathing while intentionally relaxing all parts of your body for 5 minutes daily -if you need further help with stress, anxiety or  depression please follow up with your primary doctor or contact the wonderful folks at [facility]: [phone]  SOCIAL CONNECTIONS: -options in [place] if you wish to engage in more social and exercise related activities:  -Silver sneakers https://tools.silversneakers.com  -Walk with a Doc: http://www.duncan-williams.com/  -Check out the [facility] Active Adults 50+ section on the [place] [employer] (hiking clubs, book clubs, cards and games, chess, exercise classes, aquatic classes and much more) - see the website for details: https://www.[place].gov/departments/parks-recreation/active-adults50  -YouTube has lots of exercise videos for different ages and abilities as well  -[facility] (a variety of indoor and outdoor inperson activities for adults). [phone]. [address]  -Virtual Online Classes (a variety of topics): see seniorplanet.org or call [phone]  -consider volunteering at a school, hospice center, church, senior center or elsewhere    ADVANCED HEALTHCARE DIRECTIVES:  [place] Advanced Directives assistance:   ExpressWeek.com.cy  Everyone should have advanced health care directives in place. This is so that you get the care you want, should you ever be in a situation where you are unable to make your own medical decisions.   From the [place] Advanced Directive Website: Advance Health Care Directives are legal documents in which you give written instructions about your health care if, in the future, you cannot speak for yourself.   A health care power of attorney allows you to name a person you trust to make your health care decisions if you cannot make them yourself. A declaration of a desire for a natural death (or living will) is document, which states that you desire not to have your life prolonged by extraordinary measures if you have a terminal or incurable  illness or if you are in a vegetative state. An advance instruction for mental health treatment makes a declaration of instructions, information and preferences regarding your mental health treatment. It also states that you are aware that the advance instruction authorizes a mental health treatment provider to act according to your wishes. It may also outline your consent or refusal of mental health treatment. A declaration of an anatomical  gift allows anyone over the age of 62 to make a gift by will, organ donor card or other document.   Please see the following website or an elder law attorney for forms, FAQs and for completion of advanced directives: [place]  Print production planner Health Care Directives Advance Health Care Directives (http://guzman.[email or web address]/)  Or copy and paste the following to your web browser: [email or web address]          Chiquita JONELLE Cramp, DO

## 2023-09-11 ENCOUNTER — Other Ambulatory Visit: Payer: Self-pay | Admitting: Family Medicine

## 2023-09-14 ENCOUNTER — Telehealth: Payer: Self-pay

## 2023-09-14 DIAGNOSIS — H18603 Keratoconus, unspecified, bilateral: Secondary | ICD-10-CM | POA: Diagnosis not present

## 2023-09-14 NOTE — Telephone Encounter (Signed)
 Copied from CRM [id number]. Topic: General - Other >> Sep 14, 2023  3:49 PM Thersia C wrote: Reason for CRM: Patient called in regarding a missed call, would like a callback

## 2023-09-14 NOTE — Telephone Encounter (Signed)
 Called pt. Left a voicemail to call us  and schedule an appt with Dr. Johnny for further refill on medication.

## 2023-09-14 NOTE — Telephone Encounter (Signed)
 Last office visit: 09/29/2022  Attempted to reach pt to call us  back for a follow up appt. Left message on phone.

## 2023-09-19 ENCOUNTER — Other Ambulatory Visit: Payer: Self-pay | Admitting: Family Medicine

## 2023-09-30 ENCOUNTER — Encounter: Payer: Self-pay | Admitting: Family Medicine

## 2023-09-30 ENCOUNTER — Ambulatory Visit: Payer: Self-pay | Admitting: Family Medicine

## 2023-09-30 ENCOUNTER — Ambulatory Visit: Admitting: Family Medicine

## 2023-09-30 VITALS — BP 124/72 | HR 50 | Temp 98.0°F | Ht 72.0 in | Wt 222.0 lb

## 2023-09-30 DIAGNOSIS — N401 Enlarged prostate with lower urinary tract symptoms: Secondary | ICD-10-CM

## 2023-09-30 DIAGNOSIS — M8580 Other specified disorders of bone density and structure, unspecified site: Secondary | ICD-10-CM

## 2023-09-30 DIAGNOSIS — I10 Essential (primary) hypertension: Secondary | ICD-10-CM

## 2023-09-30 DIAGNOSIS — F411 Generalized anxiety disorder: Secondary | ICD-10-CM

## 2023-09-30 DIAGNOSIS — H35373 Puckering of macula, bilateral: Secondary | ICD-10-CM | POA: Diagnosis not present

## 2023-09-30 DIAGNOSIS — D509 Iron deficiency anemia, unspecified: Secondary | ICD-10-CM | POA: Diagnosis not present

## 2023-09-30 DIAGNOSIS — E119 Type 2 diabetes mellitus without complications: Secondary | ICD-10-CM

## 2023-09-30 DIAGNOSIS — G4730 Sleep apnea, unspecified: Secondary | ICD-10-CM

## 2023-09-30 DIAGNOSIS — N138 Other obstructive and reflux uropathy: Secondary | ICD-10-CM

## 2023-09-30 DIAGNOSIS — Z23 Encounter for immunization: Secondary | ICD-10-CM | POA: Diagnosis not present

## 2023-09-30 DIAGNOSIS — I480 Paroxysmal atrial fibrillation: Secondary | ICD-10-CM

## 2023-09-30 DIAGNOSIS — I495 Sick sinus syndrome: Secondary | ICD-10-CM

## 2023-09-30 DIAGNOSIS — G8929 Other chronic pain: Secondary | ICD-10-CM

## 2023-09-30 DIAGNOSIS — E782 Mixed hyperlipidemia: Secondary | ICD-10-CM

## 2023-09-30 DIAGNOSIS — K219 Gastro-esophageal reflux disease without esophagitis: Secondary | ICD-10-CM

## 2023-09-30 LAB — CBC WITH DIFFERENTIAL/PLATELET
Basophils Absolute: 0 K/uL (ref 0.0–0.1)
Basophils Relative: 0.8 % (ref 0.0–3.0)
Eosinophils Absolute: 0.4 K/uL (ref 0.0–0.7)
Eosinophils Relative: 6.2 % — ABNORMAL HIGH (ref 0.0–5.0)
HCT: 43.7 % (ref 39.0–52.0)
Hemoglobin: 14.6 g/dL (ref 13.0–17.0)
Lymphocytes Relative: 19.3 % (ref 12.0–46.0)
Lymphs Abs: 1.1 K/uL (ref 0.7–4.0)
MCHC: 33.3 g/dL (ref 30.0–36.0)
MCV: 88.4 fl (ref 78.0–100.0)
Monocytes Absolute: 0.6 K/uL (ref 0.1–1.0)
Monocytes Relative: 9.4 % (ref 3.0–12.0)
Neutro Abs: 3.8 K/uL (ref 1.4–7.7)
Neutrophils Relative %: 64.3 % (ref 43.0–77.0)
Platelets: 162 K/uL (ref 150.0–400.0)
RBC: 4.94 Mil/uL (ref 4.22–5.81)
RDW: 14.2 % (ref 11.5–15.5)
WBC: 5.9 K/uL (ref 4.0–10.5)

## 2023-09-30 LAB — IBC + FERRITIN
Ferritin: 33.6 ng/mL (ref 22.0–322.0)
Iron: 107 ug/dL (ref 42–165)
Saturation Ratios: 32.8 % (ref 20.0–50.0)
TIBC: 326.2 ug/dL (ref 250.0–450.0)
Transferrin: 233 mg/dL (ref 212.0–360.0)

## 2023-09-30 LAB — HEPATIC FUNCTION PANEL
ALT: 23 U/L (ref 0–53)
AST: 17 U/L (ref 0–37)
Albumin: 4 g/dL (ref 3.5–5.2)
Alkaline Phosphatase: 62 U/L (ref 39–117)
Bilirubin, Direct: 0.2 mg/dL (ref 0.0–0.3)
Total Bilirubin: 0.9 mg/dL (ref 0.2–1.2)
Total Protein: 6.4 g/dL (ref 6.0–8.3)

## 2023-09-30 LAB — PSA: PSA: 2.99 ng/mL (ref 0.10–4.00)

## 2023-09-30 LAB — HEMOGLOBIN A1C: Hgb A1c MFr Bld: 6.8 % — ABNORMAL HIGH (ref 4.6–6.5)

## 2023-09-30 LAB — LIPID PANEL
Cholesterol: 148 mg/dL (ref 0–200)
HDL: 53.2 mg/dL (ref >39.00)
LDL Cholesterol: 77 mg/dL (ref 0–99)
NonHDL: 94.96
Total CHOL/HDL Ratio: 3
Triglycerides: 91 mg/dL (ref 0.0–149.0)
VLDL: 18.2 mg/dL (ref 0.0–40.0)

## 2023-09-30 LAB — BASIC METABOLIC PANEL WITH GFR
BUN: 16 mg/dL (ref 6–23)
CO2: 31 meq/L (ref 19–32)
Calcium: 9 mg/dL (ref 8.4–10.5)
Chloride: 102 meq/L (ref 96–112)
Creatinine, Ser: 1 mg/dL (ref 0.40–1.50)
GFR: 75.3 mL/min (ref >60.00)
Glucose, Bld: 117 mg/dL — ABNORMAL HIGH (ref 70–99)
Potassium: 4.4 meq/L (ref 3.5–5.1)
Sodium: 138 meq/L (ref 135–145)

## 2023-09-30 LAB — TSH: TSH: 2.01 u[IU]/mL (ref 0.35–5.50)

## 2023-09-30 MED ORDER — ATENOLOL 50 MG PO TABS: 50.0000 mg | ORAL_TABLET | Freq: Every day | ORAL | 3 refills | Status: DC

## 2023-09-30 MED ORDER — FERROUS SULFATE 325 (65 FE) MG PO TABS: 325.0000 mg | ORAL_TABLET | Freq: Every day | ORAL | 3 refills | Status: AC

## 2023-09-30 MED ORDER — LISINOPRIL 5 MG PO TABS: 5.0000 mg | ORAL_TABLET | Freq: Every day | ORAL | 3 refills | Status: AC

## 2023-09-30 MED ORDER — METFORMIN HCL 500 MG PO TABS: 500.0000 mg | ORAL_TABLET | Freq: Two times a day (BID) | ORAL | 3 refills | Status: AC

## 2023-09-30 MED ORDER — SILDENAFIL CITRATE 20 MG PO TABS: 20.0000 mg | ORAL_TABLET | ORAL | 3 refills | Status: DC | PRN

## 2023-09-30 MED ORDER — ATORVASTATIN CALCIUM 40 MG PO TABS: 40.0000 mg | ORAL_TABLET | Freq: Every day | ORAL | 3 refills | Status: AC

## 2023-09-30 MED ORDER — OMEPRAZOLE 40 MG PO CPDR: 40.0000 mg | DELAYED_RELEASE_CAPSULE | Freq: Every morning | ORAL | 3 refills | Status: AC

## 2023-09-30 MED ORDER — VENLAFAXINE HCL ER 37.5 MG PO CP24: 37.5000 mg | ORAL_CAPSULE | Freq: Every day | ORAL | 3 refills | Status: AC

## 2023-09-30 MED ORDER — FAMOTIDINE 40 MG PO TABS: 40.0000 mg | ORAL_TABLET | Freq: Every evening | ORAL | 3 refills | Status: AC

## 2023-09-30 NOTE — Addendum Note (Signed)
 Addended by: LADONNA INOCENTE SAILOR on: 09/30/2023 10:10 AM   Modules accepted: Orders

## 2023-09-30 NOTE — Progress Notes (Signed)
 Subjective:    Patient ID: [name] [name], male    DOB: 11/28/1950, 73 y.o.   MRN: [id number]  HPI Here to follow up on issues. His BP is stable, and he has not felt any fibrillation for quite a few months now. 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 His anxiety is stable. He uses his CPAP every night with good results. He does complain of an intermittent pain in the anterior left shoulder. He stays active and plays golf several days a week.    Review of Systems  Constitutional: Negative.   HENT: Negative.    Eyes: Negative.   Respiratory: Negative.    Cardiovascular: Negative.   Gastrointestinal: Negative.   Genitourinary: Negative.   Musculoskeletal:  Positive for arthralgias.  Skin: Negative.   Neurological: Negative.   Psychiatric/Behavioral: Negative.         Objective:   Physical Exam Constitutional:      General: He is not in acute distress.    Appearance: Normal appearance. He is well-developed. He is not diaphoretic.  HENT:     Head: Normocephalic and atraumatic.     Right Ear: External ear normal.     Left Ear: External ear normal.     Nose: Nose normal.     Mouth/Throat:     Pharynx: No oropharyngeal exudate.  Eyes:     General: No scleral icterus.       Right eye: No discharge.        Left eye: No discharge.     Conjunctiva/sclera: Conjunctivae normal.     Pupils: Pupils are equal, round, and reactive to light.  Neck:     Thyroid : No thyromegaly.     Vascular: No JVD.     Trachea: No tracheal deviation.  Cardiovascular:     Rate and Rhythm: Normal rate and regular rhythm.     Pulses: Normal pulses.     Heart sounds: Normal heart sounds. No murmur heard.    No friction rub. No gallop.  Pulmonary:     Effort: Pulmonary effort is normal. No respiratory distress.     Breath sounds: Normal breath sounds. No wheezing or rales.  Chest:     Chest wall: No tenderness.  Abdominal:     General: Bowel sounds are normal.  There is no distension.     Palpations: Abdomen is soft. There is no mass.     Tenderness: There is no abdominal tenderness. There is no guarding or rebound.  Genitourinary:    Penis: No tenderness.   Musculoskeletal:        General: No tenderness. Normal range of motion.     Cervical back: Neck supple.  Lymphadenopathy:     Cervical: No cervical adenopathy.  Skin:    General: Skin is warm and dry.     Coloration: Skin is not pale.     Findings: No erythema or rash.  Neurological:     General: No focal deficit present.     Mental Status: He is alert and oriented to person, place, and time.     Cranial Nerves: No cranial nerve deficit.     Motor: No abnormal muscle tone.     Coordination: Coordination normal.     Deep Tendon Reflexes: Reflexes are normal and symmetric. Reflexes normal.  Psychiatric:        Mood and Affect: Mood normal.        Behavior: Behavior normal.  Thought Content: Thought content normal.        Judgment: Judgment normal.           Assessment & Plan:  His HTN and atrial fibrillation are well controlled. His GERD and anxiety are stable. We will refer him to Sports Medicine for the left shoulder pain. Get labs to check lipids, A1c, etc. We spent a total of (34   ) minutes reviewing records and discussing these issues.  Garnette Olmsted, MD

## 2023-10-06 NOTE — Progress Notes (Unsigned)
    Ben Jackson D.[facility] [address] [place] [address] Phone: [phone]   Assessment and Plan:     There are no diagnoses linked to this encounter.  ***   Pertinent previous records reviewed include ***    Follow Up: ***     Subjective:   I, Docie Abramovich, am serving as a Neurosurgeon for Doctor Morene Mace  Chief Complaint: left shoulder pain   HPI:   10/07/2023 Patient is a 73 year old male with left shoulder pain. Patient states   Relevant Historical Information: ***  Additional pertinent review of systems negative.   Current Outpatient Medications:    aspirin  EC 325 MG tablet, Take 1 tablet (325 mg total) by mouth daily., Disp: 30 tablet, Rfl: 0   atenolol  (TENORMIN ) 50 MG tablet, Take 1 tablet (50 mg total) by mouth daily., Disp: 90 tablet, Rfl: 3   atorvastatin  (LIPITOR) 40 MG tablet, Take 1 tablet (40 mg total) by mouth daily., Disp: 90 tablet, Rfl: 3   CALCIUM -MAGNESIUM-ZINC PO, Take 1 tablet by mouth in the morning., Disp: , Rfl:    Cholecalciferol (VITAMIN D3 PO), Take 2,000 Units by mouth in the morning., Disp: , Rfl:    famotidine  (PEPCID ) 40 MG tablet, Take 1 tablet (40 mg total) by mouth every evening., Disp: 90 tablet, Rfl: 3   ferrous sulfate  (FEROSUL) 325 (65 FE) MG tablet, Take 1 tablet (325 mg total) by mouth daily., Disp: 90 tablet, Rfl: 3   fluticasone (FLONASE) 50 MCG/ACT nasal spray, Place 2 sprays into both nostrils in the morning., Disp: , Rfl:    lisinopril  (ZESTRIL ) 5 MG tablet, Take 1 tablet (5 mg total) by mouth daily., Disp: 90 tablet, Rfl: 3   metFORMIN  (GLUCOPHAGE ) 500 MG tablet, Take 1 tablet (500 mg total) by mouth 2 (two) times daily with a meal., Disp: 180 tablet, Rfl: 3   mometasone  (ELOCON ) 0.1 % lotion, For psoriasis of the scalp apply to aa's QHS and wash out in the morning. Use up to 5d/wk PRN. Avoid f/g/a., Disp: 60 mL, Rfl: 11   omeprazole  (PRILOSEC) 40 MG capsule, Take 1 capsule  (40 mg total) by mouth every morning., Disp: 90 capsule, Rfl: 3   Roflumilast  (ZORYVE ) 0.3 % CREA, Apply to aa's psoriasis QD PRN., Disp: 60 g, Rfl: 6   sildenafil  (REVATIO ) 20 MG tablet, Take 1 tablet (20 mg total) by mouth as needed., Disp: 90 tablet, Rfl: 3   venlafaxine  XR (EFFEXOR -XR) 37.5 MG 24 hr capsule, Take 1 capsule (37.5 mg total) by mouth daily with breakfast., Disp: 90 capsule, Rfl: 3   Objective:     There were no vitals filed for this visit.    There is no height or weight on file to calculate BMI.    Physical Exam:    ***   Electronically signed by:  Odis Mace D.[facility] 7:50 AM 10/06/23

## 2023-10-07 ENCOUNTER — Other Ambulatory Visit (HOSPITAL_COMMUNITY): Payer: Self-pay

## 2023-10-07 ENCOUNTER — Ambulatory Visit: Admitting: Sports Medicine

## 2023-10-07 ENCOUNTER — Ambulatory Visit (INDEPENDENT_AMBULATORY_CARE_PROVIDER_SITE_OTHER)

## 2023-10-07 ENCOUNTER — Telehealth: Payer: Self-pay

## 2023-10-07 VITALS — BP 124/86 | HR 59 | Ht 72.0 in | Wt 224.0 lb

## 2023-10-07 DIAGNOSIS — G8929 Other chronic pain: Secondary | ICD-10-CM | POA: Diagnosis not present

## 2023-10-07 DIAGNOSIS — M7532 Calcific tendinitis of left shoulder: Secondary | ICD-10-CM | POA: Diagnosis not present

## 2023-10-07 DIAGNOSIS — M19012 Primary osteoarthritis, left shoulder: Secondary | ICD-10-CM | POA: Diagnosis not present

## 2023-10-07 DIAGNOSIS — M25512 Pain in left shoulder: Secondary | ICD-10-CM | POA: Diagnosis not present

## 2023-10-07 NOTE — Telephone Encounter (Signed)
 Pharmacy Patient Advocate Encounter   Received notification from CoverMyMeds that prior authorization for Sildenafil  Citrate (PAH) 20MG  tablets  is required/requested.   Insurance verification completed.   The patient is insured through [employer].   Per test claim: PA required; PA submitted to above mentioned insurance via CoverMyMeds Key/confirmation #/EOC [facility] Status is pending

## 2023-10-07 NOTE — Patient Instructions (Signed)
 Shoulder HEP 3-4 week follow up

## 2023-10-08 ENCOUNTER — Telehealth: Payer: Self-pay | Admitting: *Deleted

## 2023-10-08 NOTE — Telephone Encounter (Signed)
 Copied from CRM [id number]. Topic: Referral - Prior Authorization Question >> Oct 07, 2023  5:08 PM Jayma L wrote: Reason for CRM: germera called with [facility] medicare, the medicine called sildenafil  (REVATIO ) 20 MG tablet has been denied thru medicare with D. Sending a fax.

## 2023-10-08 NOTE — Telephone Encounter (Signed)
 Pharmacy Patient Advocate Encounter  Received notification from [facility] Medicare that Prior Authorization for Sildenafil  Citrate (PAH) 20MG  tablets  has been DENIED.  See denial reason below. No denial letter attached in CMM. Will attach denial letter to Media tab once received.   PA #/Case ID/Reference #: [id number]

## 2023-10-09 ENCOUNTER — Ambulatory Visit: Payer: Self-pay | Admitting: Sports Medicine

## 2023-10-09 DIAGNOSIS — H35371 Puckering of macula, right eye: Secondary | ICD-10-CM | POA: Diagnosis not present

## 2023-10-27 ENCOUNTER — Ambulatory Visit: Payer: Medicare HMO | Admitting: Dermatology

## 2023-10-27 ENCOUNTER — Encounter: Payer: Self-pay | Admitting: Dermatology

## 2023-10-27 DIAGNOSIS — L409 Psoriasis, unspecified: Secondary | ICD-10-CM

## 2023-10-27 DIAGNOSIS — L729 Follicular cyst of the skin and subcutaneous tissue, unspecified: Secondary | ICD-10-CM

## 2023-10-27 DIAGNOSIS — Z79899 Other long term (current) drug therapy: Secondary | ICD-10-CM

## 2023-10-27 DIAGNOSIS — L738 Other specified follicular disorders: Secondary | ICD-10-CM

## 2023-10-27 DIAGNOSIS — L820 Inflamed seborrheic keratosis: Secondary | ICD-10-CM | POA: Diagnosis not present

## 2023-10-27 DIAGNOSIS — W908XXA Exposure to other nonionizing radiation, initial encounter: Secondary | ICD-10-CM | POA: Diagnosis not present

## 2023-10-27 DIAGNOSIS — Z1283 Encounter for screening for malignant neoplasm of skin: Secondary | ICD-10-CM | POA: Diagnosis not present

## 2023-10-27 DIAGNOSIS — L570 Actinic keratosis: Secondary | ICD-10-CM | POA: Diagnosis not present

## 2023-10-27 DIAGNOSIS — R238 Other skin changes: Secondary | ICD-10-CM

## 2023-10-27 DIAGNOSIS — Z7189 Other specified counseling: Secondary | ICD-10-CM

## 2023-10-27 DIAGNOSIS — D692 Other nonthrombocytopenic purpura: Secondary | ICD-10-CM

## 2023-10-27 DIAGNOSIS — D229 Melanocytic nevi, unspecified: Secondary | ICD-10-CM

## 2023-10-27 DIAGNOSIS — D1801 Hemangioma of skin and subcutaneous tissue: Secondary | ICD-10-CM | POA: Diagnosis not present

## 2023-10-27 DIAGNOSIS — H61002 Unspecified perichondritis of left external ear: Secondary | ICD-10-CM

## 2023-10-27 DIAGNOSIS — L720 Epidermal cyst: Secondary | ICD-10-CM

## 2023-10-27 DIAGNOSIS — L578 Other skin changes due to chronic exposure to nonionizing radiation: Secondary | ICD-10-CM | POA: Diagnosis not present

## 2023-10-27 MED ORDER — TACROLIMUS 0.1 % EX OINT: TOPICAL_OINTMENT | Freq: Two times a day (BID) | CUTANEOUS | 0 refills | Status: AC

## 2023-10-27 NOTE — Progress Notes (Signed)
 Follow-Up Visit   Subjective  [name] [name] is a 73 y.o. male who presents for the following: Skin Cancer Screening and Full Body Skin Exam, hx of precancers, patient c/o several irritated spots on his face and ears growing and changing, patient would like these areas removed today   The patient presents for Total-Body Skin Exam (TBSE) for skin cancer screening and mole check. The patient has spots, moles and lesions to be evaluated, some may be new or changing and the patient may have concern these could be cancer.  The following portions of the chart were reviewed this encounter and updated as appropriate: medications, allergies, medical history  Review of Systems:  No other skin or systemic complaints except as noted in HPI or Assessment and Plan.  Objective  Well appearing patient in no apparent distress; mood and affect are within normal limits.  A full examination was performed including scalp, head, eyes, ears, nose, lips, neck, chest, axillae, abdomen, back, buttocks, bilateral upper extremities, bilateral lower extremities, hands, feet, fingers, toes, fingernails, and toenails. All findings within normal limits unless otherwise noted below.   Relevant physical exam findings are noted in the Assessment and Plan.  left glabella x 2, right glabella x 1 (3) Stuck-on, waxy, tan-brown papules and plaques -- Discussed benign etiology and prognosis.  Left Ear superior helix Erythematous thin papules/macules with gritty scale.  Left glabella /symptomatic - 1-2 mm smooth symmetric flesh colored to pink papule Right medial cheek infraorbital, left forehead above medial brow -- Irritated / symptomatic (2) Small yellow papules with a central dell      Assessment & Plan   SKIN CANCER SCREENING PERFORMED TODAY.  LENTIGINES, SEBORRHEIC KERATOSES, HEMANGIOMAS - Benign normal skin lesions - Benign-appearing - Call for any changes  MELANOCYTIC NEVI - Tan-brown and/or  pink-flesh-colored symmetric macules and papules - Benign appearing on exam today - Observation - Call clinic for new or changing moles - Recommend daily use of broad spectrum spf 30+ sunscreen to sun-exposed areas.   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 4% BSA. Chronic and persistent condition with duration or expected duration over one year. Condition is symptomatic/ bothersome to patient. Not currently at goal. patient denies joint pain Psoriasis is a chronic non-curable, but treatable genetic/hereditary disease that may have other systemic features affecting other organ systems such as joints (Psoriatic Arthritis). It is associated with an increased risk of inflammatory bowel disease, heart disease, non-alcoholic fatty liver disease, and depression.  Treatments include light and laser treatments; topical medications; and systemic medications including oral and injectables.  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  Trauma or pressure from sleeping on the ear or from cell phone use and sun damage may be exacerbating factors.  Treatment may include using a C-shaped airplane neck pillow for sleeping on the side of the head so no pressure is on the ear.  Other treatments include topical or intralesional steroids; liquid nitrogen or laser destruction; shave removal or excision.  The condition can be difficult to treat and persist or recur despite treatment.  Treatment Plan:  Continue Mometasone  solution to aa's up to 5d/week.  Start sample Opzelura cream apply qd  (1 given) Lot [id number] Exp 11/07/2024 Start Tacrolimus  ointment apply qd-bid  Long term medication management.  Patient  is using long term (months to years)  prescription medication  to control their dermatologic condition.  These medications require periodic monitoring to evaluate for efficacy and side effects and may require periodic laboratory monitoring.  Recheck left ear middle helix in 8 weeks   Purpura - Chronic; persistent and recurrent.  Treatable, but not curable. Arms  - Violaceous macules and patches - Benign - Related to trauma, age, sun damage and/or use of blood thinners, chronic use of topical and/or oral steroids - Observe - Can use OTC arnica containing moisturizer such as Dermend Bruise Formula if desired - Call for worsening or other concerns    EPIDERMAL INCLUSION CYST Exam: 0.6 cm Subcutaneous nodule at left medial thigh Benign-appearing. Exam most consistent with an epidermal inclusion cyst. Discussed that a cyst is a benign growth that can grow over time and sometimes get irritated or inflamed. Recommend observation if it is not bothersome. Discussed option of surgical excision to remove it if it is growing, symptomatic, or other changes noted. Please call for new or changing lesions so they can be evaluated. Recommend surgery removal with Dr Claudene _ Dr Claudene examined pt today.  INFLAMED SEBORRHEIC KERATOSIS (3) left glabella x 2, right glabella x 1 (3) Destruction of lesion - left glabella x 2, right glabella x 1 (3) Complexity: simple   Destruction method: cryotherapy   Informed consent: discussed and consent obtained   Timeout:  patient name, date of birth, surgical site, and procedure verified Lesion destroyed using liquid nitrogen: Yes   Region frozen until ice ball extended beyond lesion: Yes   Outcome: patient tolerated procedure well with no complications   Post-procedure details: wound care instructions given    AK (ACTINIC KERATOSIS) Left Ear superior helix ACTINIC DAMAGE - chronic, secondary to cumulative UV radiation exposure/sun exposure over time - diffuse scaly erythematous macules with underlying  dyspigmentation - Recommend daily broad spectrum sunscreen SPF 30+ to sun-exposed areas, reapply every 2 hours as needed.  - Recommend staying in the shade or wearing long sleeves, sun glasses (UVA+UVB protection) and wide brim hats (4-inch brim around the entire circumference of the hat). - Call for new or changing lesions.  Destruction of lesion - Left Ear superior helix Complexity: simple   Destruction method: cryotherapy   Informed consent: discussed and consent obtained   Timeout:  patient name, date of birth, surgical site, and procedure verified Lesion destroyed using liquid nitrogen: Yes   Region frozen until ice ball extended beyond lesion: Yes   Outcome: patient tolerated procedure well with no complications   Post-procedure details: wound care instructions given    FIBROUS PAPULE OF SKIN Left glabella /symptomatic Destruction of lesion - Left glabella /symptomatic Complexity: simple   Destruction method comment:  Electrodesiccation Timeout:  patient name, date of birth, surgical site, and procedure verified Procedure prep:  Patient was prepped and draped in usual sterile fashion Outcome: patient tolerated procedure well with no complications    SEBACEOUS HYPERPLASIA Right medial cheek infraorbital, left forehead above medial brow -- Irritated / symptomatic (2) Destruction of lesion - Right medial cheek infraorbital, left forehead above medial brow -- Irritated / symptomatic (2) Complexity: simple   Destruction method comment:  Electrodesiccation Timeout:  patient name, date of birth, surgical site, and procedure verified Procedure prep:  Patient was prepped and draped in usual sterile fashion Outcome: patient tolerated procedure well with no complications    ACTINIC SKIN DAMAGE   SKIN CANCER SCREENING   PSORIASIS   Related  Medications mometasone  (ELOCON ) 0.1 % lotion For psoriasis of the scalp apply to aa's QHS and wash out in the morning. Use up to 5d/wk PRN.  Avoid f/g/a. COUNSELING AND COORDINATION OF CARE   MEDICATION MANAGEMENT   CNH (CHONDRODERMATITIS NODULARIS HELICIS), LEFT   PURPURA (HCC)     Return in about 8 weeks (around 12/22/2023) for Psoriasis,  Cyst surgery with Dr Claudene .  IFay Kirks, CMA, am acting as scribe for Alm Rhyme, MD .   Documentation: I have reviewed the above documentation for accuracy and completeness, and I agree with the above.  Alm Rhyme, MD

## 2023-10-27 NOTE — Patient Instructions (Addendum)

## 2023-10-28 ENCOUNTER — Ambulatory Visit: Admitting: Sports Medicine

## 2023-11-04 DIAGNOSIS — G4733 Obstructive sleep apnea (adult) (pediatric): Secondary | ICD-10-CM | POA: Diagnosis not present

## 2023-11-11 ENCOUNTER — Encounter: Payer: Self-pay | Admitting: Dermatology

## 2023-11-11 ENCOUNTER — Encounter: Admitting: Dermatology

## 2023-11-11 ENCOUNTER — Ambulatory Visit: Admitting: Dermatology

## 2023-11-11 DIAGNOSIS — L720 Epidermal cyst: Secondary | ICD-10-CM

## 2023-11-11 MED ORDER — MUPIROCIN 2 % EX OINT: 1.0000 | TOPICAL_OINTMENT | Freq: Every day | CUTANEOUS | 0 refills | Status: AC

## 2023-11-11 NOTE — Progress Notes (Signed)
   Follow-Up Visit   Subjective  MD SMOLA is a 73 y.o. male who presents for the following: Excision of cyst at left medial thigh  The following portions of the chart were reviewed this encounter and updated as appropriate: medications, allergies, medical history  Review of Systems:  No other skin or systemic complaints except as noted in HPI or Assessment and Plan.  Objective  Well appearing patient in no apparent distress; mood and affect are within normal limits.  A focused examination was performed of the following areas: Left thigh  Relevant physical exam findings are noted in the Assessment and Plan.   left medial thigh 0.6 cm Subcutaneous nodule.   Assessment & Plan   EPIDERMAL INCLUSION CYST left medial thigh Skin excision - left medial thigh  Excision method:  elliptical Total excision diameter (cm):  0.6 Informed consent: discussed and consent obtained   Timeout: patient name, date of birth, surgical site, and procedure verified   Procedure prep:  Patient was prepped and draped in usual sterile fashion Prep type:  Chlorhexidine  Anesthesia: the lesion was anesthetized in a standard fashion   Anesthetic:  1% lidocaine  w/ epinephrine 1-100,000 buffered w/ 8.4% NaHCO3 (9 cc) Instrument used: #15 blade   Hemostasis achieved with: suture, pressure and electrodesiccation   Outcome: patient tolerated procedure well with no complications    Skin repair - left medial thigh Complexity:  Intermediate Final length (cm):  1.8 Informed consent: discussed and consent obtained   Timeout: patient name, date of birth, surgical site, and procedure verified   Procedure prep:  Patient was prepped and draped in usual sterile fashion Prep type:  Chlorhexidine  Anesthesia: the lesion was anesthetized in a standard fashion   Anesthetic:  1% lidocaine  w/ epinephrine 1-100,000 buffered w/ 8.4% NaHCO3 Reason for type of repair: reduce tension to allow closure, reduce the risk of  dehiscence, infection, and necrosis, reduce subcutaneous dead space and avoid a hematoma, allow closure of the large defect and preserve normal anatomy   Undermining: edges could be approximated without difficulty   Subcutaneous layers (deep stitches):  Suture size:  4-0 Suture type: Monocryl (poliglecaprone 25)   Stitches:  Buried vertical mattress Fine/surface layer approximation (top stitches):  Suture size:  5-0 Suture type: Prolene (polypropylene)   Stitches: simple running   Suture removal (days):  7 Hemostasis achieved with: suture, pressure and electrodesiccation Outcome: patient tolerated procedure well with no complications   Post-procedure details: sterile dressing applied and wound care instructions given   Dressing type: petrolatum, bandage and pressure dressing    Specimen 1 - Surgical pathology Differential Diagnosis: Cyst vs Other  Check Margins: No   Return in about 1 week (around 11/18/2023) for Suture Removal.  LILLETTE Lonell Drones, RMA, am acting as scribe for Boneta Sharps, MD .   Documentation: I have reviewed the above documentation for accuracy and completeness, and I agree with the above.  Boneta Sharps, MD

## 2023-11-11 NOTE — Patient Instructions (Signed)

## 2023-11-12 ENCOUNTER — Telehealth: Payer: Self-pay

## 2023-11-12 DIAGNOSIS — G4733 Obstructive sleep apnea (adult) (pediatric): Secondary | ICD-10-CM | POA: Diagnosis not present

## 2023-11-12 NOTE — Telephone Encounter (Signed)
Patient doing fine after yesterdays surgery. [name] Reddick S., RMA 

## 2023-11-13 LAB — SURGICAL PATHOLOGY

## 2023-11-16 ENCOUNTER — Ambulatory Visit: Payer: Self-pay | Admitting: Dermatology

## 2023-11-16 ENCOUNTER — Other Ambulatory Visit: Payer: Self-pay | Admitting: Family Medicine

## 2023-11-16 NOTE — Telephone Encounter (Signed)
 Patient notified that excision showed a benign cyst as expected, patient states he is doing fine after surgery.

## 2023-11-16 NOTE — Telephone Encounter (Signed)
-----   Message from [place] sent at 11/16/2023  9:02 AM EDT ----- Diagnosis left medial thigh :       EPIDERMAL INCLUSION CYST   Please call to share that excision shows a benign cyst as expected and get update on surgical wound. Thank you. ----- Message ----- From: Interface, [facility] Sent: 11/13/2023   4:35 PM EDT To: Boneta Sharps, MD

## 2023-11-18 ENCOUNTER — Ambulatory Visit (INDEPENDENT_AMBULATORY_CARE_PROVIDER_SITE_OTHER): Admitting: Dermatology

## 2023-11-18 ENCOUNTER — Other Ambulatory Visit (HOSPITAL_COMMUNITY): Payer: Self-pay

## 2023-11-18 ENCOUNTER — Encounter: Payer: Self-pay | Admitting: Dermatology

## 2023-11-18 DIAGNOSIS — Z5189 Encounter for other specified aftercare: Secondary | ICD-10-CM

## 2023-11-18 DIAGNOSIS — Z48817 Encounter for surgical aftercare following surgery on the skin and subcutaneous tissue: Secondary | ICD-10-CM

## 2023-11-18 DIAGNOSIS — Z4802 Encounter for removal of sutures: Secondary | ICD-10-CM

## 2023-11-18 NOTE — Progress Notes (Unsigned)
   Follow-Up Visit   Subjective  [name] [name] is a 73 y.o. male who presents for the following: Suture removal  Pathology showed benign cyst  The following portions of the chart were reviewed this encounter and updated as appropriate: medications, allergies, medical history  Review of Systems:  No other skin or systemic complaints except as noted in HPI or Assessment and Plan.  Objective  Well appearing patient in no apparent distress; mood and affect are within normal limits.  Areas Examined: Left thigh Relevant physical exam findings are noted in the Assessment and Plan.    Assessment & Plan   VISIT FOR WOUND CHECK   ENCOUNTER FOR REMOVAL OF SUTURES   Encounter for Removal of Sutures - Incision site is clean, dry and intact. - Wound cleansed, sutures removed, wound cleansed and steri strips applied.  - Discussed pathology results showing benign cyst - Patient advised to keep steri-strips dry until they fall off. - Scars remodel for a full year. - Once steri-strips fall off, patient can apply over-the-counter silicone scar cream once to twice a day to help with scar remodeling if desired. - Patient advised to call with any concerns or if they notice any new or changing lesions.  Return for as scheduled, with Dr. MARLA, Psoriasis.  LILLETTE Lonell Drones, RMA, am acting as scribe for Boneta Sharps, MD .   Documentation: I have reviewed the above documentation for accuracy and completeness, and I agree with the above.  Boneta Sharps, MD

## 2023-11-18 NOTE — Patient Instructions (Signed)

## 2023-11-19 ENCOUNTER — Encounter: Payer: Self-pay | Admitting: Dermatology

## 2023-11-20 ENCOUNTER — Telehealth: Payer: Self-pay

## 2023-11-20 ENCOUNTER — Other Ambulatory Visit (HOSPITAL_COMMUNITY): Payer: Self-pay

## 2023-11-20 NOTE — Telephone Encounter (Signed)
 Pharmacy Patient Advocate Encounter   Received notification from Onbase that prior authorization for Sildenafil  citrate 20 is required/requested.   Insurance verification completed.   The patient is insured through [facility] .   Per test claim: PA required; PA submitted to above mentioned insurance via Latent Key/confirmation #/EOC [id number] Status is pending

## 2023-11-23 ENCOUNTER — Other Ambulatory Visit (HOSPITAL_COMMUNITY): Payer: Self-pay

## 2023-11-23 NOTE — Telephone Encounter (Signed)
 Pharmacy Patient Advocate Encounter  Received notification from [facility] that Prior Authorization for Sildenafil  citrate 20 has been CANCELLED due to    PA #/Case ID/Reference #: # [id number]

## 2023-11-27 ENCOUNTER — Ambulatory Visit (INDEPENDENT_AMBULATORY_CARE_PROVIDER_SITE_OTHER)

## 2023-11-27 ENCOUNTER — Ambulatory Visit: Payer: Self-pay

## 2023-11-27 DIAGNOSIS — M722 Plantar fascial fibromatosis: Secondary | ICD-10-CM

## 2023-11-27 DIAGNOSIS — M79672 Pain in left foot: Secondary | ICD-10-CM | POA: Diagnosis not present

## 2023-11-27 DIAGNOSIS — E119 Type 2 diabetes mellitus without complications: Secondary | ICD-10-CM

## 2023-11-27 MED ORDER — TRIAMCINOLONE ACETONIDE 10 MG/ML IJ SUSP: 5.0000 mg | Freq: Once | INTRAMUSCULAR | Status: AC

## 2023-11-27 NOTE — Patient Instructions (Signed)

## 2023-11-27 NOTE — Progress Notes (Signed)
 Subjective:  Patient ID: [name] [name], male    DOB: June 16, 1950,  MRN: [id number]  Chief Complaint  Patient presents with   Foot Pain    Patient is here for left heel pain chronic plantar fasciitis would like injection for pain at this time    73 y.o. male presents with the above complaint.  Patient states that he has had left heel plantar fasciitis in the past.  He was treated with custom orthotics, injection, stretching.  This did work quite well for him.  However, over the last year he has noted a recurrence in his plantar fasciitis after he stopped wearing his orthotics consistently.  This is now affecting his activities of daily living.  Review of Systems: Negative except as noted in the HPI. Denies N/V/F/Ch.  Past Medical History:  Diagnosis Date   Allergy    Anxiety    Arthritis    Atrial fibrillation (HCC)    sees Dr. Waddell   Cataract    forming   Chronic kidney disease    kidney stone   Depression    Diabetes mellitus without complication (HCC)    Dizziness    or vertigo   Dysrhythmia    atrial fibrillation, once or twice a year   GERD (gastroesophageal reflux disease)    Headache    eyglass flicker in eye is migraine symptoms   History of kidney stones    HLD (hyperlipidemia)    HTN (hypertension)    Keratoconus of both eyes    sees Helene Deacon MD and Thom Hamilton OD   Osteopenia    Retinal tear    Sinus bradycardia    Sleep apnea     Current Outpatient Medications:    aspirin  EC 325 MG tablet, Take 1 tablet (325 mg total) by mouth daily., Disp: 30 tablet, Rfl: 0   atenolol  (TENORMIN ) 50 MG tablet, Take 1 tablet (50 mg total) by mouth daily., Disp: 90 tablet, Rfl: 3   atorvastatin  (LIPITOR) 40 MG tablet, Take 1 tablet (40 mg total) by mouth daily., Disp: 90 tablet, Rfl: 3   CALCIUM -MAGNESIUM-ZINC PO, Take 1 tablet by mouth in the morning., Disp: , Rfl:    Cholecalciferol (VITAMIN D3 PO), Take 2,000 Units by mouth in the morning., Disp: , Rfl:     famotidine  (PEPCID ) 40 MG tablet, Take 1 tablet (40 mg total) by mouth every evening., Disp: 90 tablet, Rfl: 3   ferrous sulfate  (FEROSUL) 325 (65 FE) MG tablet, Take 1 tablet (325 mg total) by mouth daily., Disp: 90 tablet, Rfl: 3   fluticasone (FLONASE) 50 MCG/ACT nasal spray, Place 2 sprays into both nostrils in the morning., Disp: , Rfl:    lisinopril  (ZESTRIL ) 5 MG tablet, Take 1 tablet (5 mg total) by mouth daily., Disp: 90 tablet, Rfl: 3   metFORMIN  (GLUCOPHAGE ) 500 MG tablet, Take 1 tablet (500 mg total) by mouth 2 (two) times daily with a meal., Disp: 180 tablet, Rfl: 3   mometasone  (ELOCON ) 0.1 % lotion, For psoriasis of the scalp apply to aa's QHS and wash out in the morning. Use up to 5d/wk PRN. Avoid f/g/a., Disp: 60 mL, Rfl: 11   mupirocin  ointment (BACTROBAN ) 2 %, Apply 1 Application topically daily., Disp: 22 g, Rfl: 0   omeprazole  (PRILOSEC) 40 MG capsule, Take 1 capsule (40 mg total) by mouth every morning., Disp: 90 capsule, Rfl: 3   Roflumilast  (ZORYVE ) 0.3 % CREA, Apply to aa's psoriasis QD PRN., Disp: 60 g, Rfl: 6  sildenafil  (REVATIO ) 20 MG tablet, TAKE 5 TABLETS BY MOUTH AS NEEDED, Disp: 150 tablet, Rfl: 5   tacrolimus  (PROTOPIC ) 0.1 % ointment, Apply topically 2 (two) times daily., Disp: 100 g, Rfl: 0   venlafaxine  XR (EFFEXOR -XR) 37.5 MG 24 hr capsule, Take 1 capsule (37.5 mg total) by mouth daily with breakfast., Disp: 90 capsule, Rfl: 3  Social History   Tobacco Use  Smoking Status Former   Current packs/day: 0.00   Types: Cigarettes   Quit date: 03/11/1975   Years since quitting: 48.7  Smokeless Tobacco Never    Allergies  Allergen Reactions   Oxycodone Nausea And Vomiting   Paroxetine Nausea And Vomiting and Other (See Comments)    Altered mental state   Sulfa Antibiotics Hives   Objective:  There were no vitals filed for this visit. There is no height or weight on file to calculate BMI. Constitutional Well developed. Well nourished. Oriented to  person, place, and time.  Vascular Dorsalis pedis pulses palpable bilaterally. Posterior tibial pulses palpable bilaterally. Capillary refill normal to all digits.  No cyanosis or clubbing noted. Pedal hair growth normal.  Neurologic Normal speech. Epicritic sensation to light touch grossly present bilaterally. Negative tinel sign at tarsal tunnel bilaterally.   Dermatologic Skin texture and turgor are within normal limits.  No open wounds. No skin lesions.  Musculoskeletal: Normal HF and 1st MTP ROM without pain or crepitus bilaterally. Rectus footshape. Tender to palpation at the calcaneal tuber left. No pain with calcaneal squeeze left. Ankle ROM diminished range of motion left. Silfverskiold Test: negative left.   Radiographs: Taken and reviewed. No acute fractures or dislocations. No evidence of stress fracture.  Small plantar heel spur . Posterior heel spur present. Rectus foot shape. Joints well maintained without significant arthropathy.   Assessment:   1. Plantar fasciitis of left foot   2. Foot pain, left   3. Type 2 diabetes mellitus without complication, without long-term current use of insulin (HCC)    Plan:  Patient was evaluated and treated and all questions answered.  Plantar Fasciitis, left - XR reviewed as above.  - Educated on icing and stretching. Instructions given.  - Discussed supportive shoegear and use of insoles.  Patient has custom orthotics that have good integrity.  He is going to return to using these consistently. - Discussed treatment options to reduce inflammation in plantar fascia. Risks and benefits discussed. The patient would like to proceed with localized steroid injection as described below..   Procedure: Injection Tendon/Ligament Location: Left plantar fascia at the glabrous junction; medial approach. Skin Prep: alcohol Injectate: 0.5 cc 0.5% marcaine plain, 0.5 cc of 1% Lidocaine , 0.5 cc kenalog  10. Disposition: Patient tolerated  procedure well. Injection site dressed with a band-aid.   RTC PRN  Prentice Ovens, DPM AACFAS Fellowship Trained Podiatric Surgeon [facility]

## 2023-11-30 ENCOUNTER — Ambulatory Visit: Admitting: Primary Care

## 2023-11-30 ENCOUNTER — Encounter: Payer: Self-pay | Admitting: Primary Care

## 2023-11-30 VITALS — BP 134/72 | HR 61 | Temp 97.7°F | Ht 72.0 in | Wt 225.5 lb

## 2023-11-30 DIAGNOSIS — G4730 Sleep apnea, unspecified: Secondary | ICD-10-CM

## 2023-11-30 DIAGNOSIS — J301 Allergic rhinitis due to pollen: Secondary | ICD-10-CM

## 2023-11-30 NOTE — Patient Instructions (Signed)
  VISIT SUMMARY: You came in today for your one-year follow-up appointment regarding your severe sleep apnea. You have been using your CPAP machine consistently, and your condition is well controlled. We also discussed your nasal congestion, which you attribute to allergies, and how it affects your CPAP usage.  YOUR PLAN: -OBSTRUCTIVE SLEEP APNEA: Obstructive sleep apnea is a condition where your airway becomes blocked during sleep, causing breathing pauses. Your condition is well controlled with CPAP therapy, which you use consistently. To address nasal dryness, increase the CPAP humidification setting to 5. If you continue to have issues with your mask, we can discuss a mask desensitization study. You should also consider obtaining CPAP supplies from various vendors and monitor your CPAP machine for replacement every 5 years.  -ALLERGIC RHINITIS: Allergic rhinitis is an allergic reaction that causes nasal congestion, especially when lying down. To help with your symptoms, consider using azelastine nasal spray and increase your CPAP humidification. If your nasal congestion continues to be a problem, we may refer you to an ENT specialist.  INSTRUCTIONS: Please follow up with us  if you continue to experience issues with your CPAP mask or nasal congestion. Consider obtaining CPAP supplies from various vendors and monitor your CPAP machine for replacement every 5 years. If your nasal congestion persists, we may refer you to an ENT specialist.   Follow-up 1 year with [facility] NP for OSA/CPAP

## 2023-11-30 NOTE — Progress Notes (Signed)
 @Patient  ID: [name] [name], male    DOB: 02/27/1951, 73 y.o.   MRN: [id number]  No chief complaint on file.   Referring provider: Johnny Garnette LABOR, MD  HPI: 73 year old male, former smoker. PMH Afib, HTN, severe sleep apnea, type 2 diabetes, osteopenia, hyperlipidemia, iron  deficiency anemia, hyperlipidemia.   Previous [facility] pulmonary encounter:  10/28/2021 Patient presents today for sleep consult. He has symptoms of loud snoring, insomnia, restless sleep and daytime sleepiness. Newly diagnosed with diabetes and iron  deficiency anemia, states that he has been sleeping better since being on medication. He has a sleep number bed and score has also improved. He has history of afib, atenolol  will make him tired. He can tell when he goes into arrhythmia which tends to happen twice a year, he has flecainide  available prn. Typical bedtime is around 11:30 pm, it can take several hours to fall asleep. He wakes up between 3-4 times a night to use restroom. He starts his day between 7-8am. No previous sleep studies. Epworth score 14. No symptoms of narcolepsy or cataplexy.  Sleep questionnaire Symptoms- Snoring, restless sleep and daytime fatigue  Prior sleep study- None Bedtime- 11:30PM Time to fall asleep- varies; several hours at times Nocturnal awakenings- 3-4 times Out of bed/start of day- 7-8am Weight changes- fluctuates 10-15 lbs  Do you operate heavy machinery- No Do you currently wear CPAP- No Do you current wear oxygen- No Epworth- 14  08/07/2022 Patient presents today for 6 month follow-up. HST on 02/10/22 showed severe OSA, AHI 63.1/hr with SpO2 low 77% (average 92%). Started on CPAP in January 2024. He is doing well. No issues with mask fit.  He adjusted ramp to 45 mins. At times feels pressure can be too strong at night. Daytime sleepiness and fatigue have improved.   Airview download [phone] - 08/05/2022 Usage days 30/30 days (100%); 23 days (77%) greater than 4 hours Average usage 5  hours 39 minutes Pressure 5 to 20 cm H2O (10.2 cm H2O-95%) Air leaks 14.5 L/min (95%) AHI 1.5  11/30/2023- Interim hx  Discussed the use of AI scribe software for clinical note transcription with the patient, who gave verbal consent to proceed.  History of Present Illness [name] [name] is a 73 year old male with severe sleep apnea who presents for a one-year follow-up.  He was diagnosed with severe sleep apnea in December 2023, with a sleep study revealing 63 apneic events per hour and a lowest oxygen saturation of 77%. He commenced CPAP therapy in January 2024.  He consistently uses the CPAP machine, averaging 5 hours and 19 minutes of use per night over the past 30 days. The CPAP is set on auto settings with a minimum pressure of 5 and a maximum of 15, with an average pressure of 7.9 and a maximum of 12.7. His residual apnea index is 2 events per hour. He experiences some air leaks with the nasal mask. He cleans his CPAP equipment daily using a cleaning apparatus and orders supplies as needed, though he misplaced a recent order for a tubing piece.  He experiences nasal congestion, particularly when lying down, which he attributes to allergies from outdoor activities like golf and yard work. He uses saline nasal spray two to three times a night to alleviate symptoms. He has Flonase but reports dryness rather than runny nose or postnasal drip. No runny nose or postnasal drip. He has not tried a full face mask and manages to sleep well once comfortable with the  nasal mask.  Airview download 10/28/23-11/26/23 Usage days 30/30 days; 23 days (77%) > 4 hours Average usage 5 hours 19 mins Pressure 5-15cm h20 (11.6cm h20-95%) Airleaks 27.3L/min (95%) AHI 2.0   Allergies  Allergen Reactions   Oxycodone Nausea And Vomiting   Paroxetine Nausea And Vomiting and Other (See Comments)    Altered mental state   Sulfa Antibiotics Hives    Immunization History  Administered Date(s) Administered    PNEUMOCOCCAL CONJUGATE-20 09/30/2023    Past Medical History:  Diagnosis Date   Allergy    Anxiety    Arthritis    Atrial fibrillation (HCC)    sees Dr. Waddell   Cataract    forming   Chronic kidney disease    kidney stone   Depression    Diabetes mellitus without complication (HCC)    Dizziness    or vertigo   Dysrhythmia    atrial fibrillation, once or twice a year   GERD (gastroesophageal reflux disease)    Headache    eyglass flicker in eye is migraine symptoms   History of kidney stones    HLD (hyperlipidemia)    HTN (hypertension)    Keratoconus of both eyes    sees Helene Deacon MD and Thom Hamilton OD   Osteopenia    Retinal tear    Sinus bradycardia    Sleep apnea     Tobacco History: Social History   Tobacco Use  Smoking Status Former   Current packs/day: 0.00   Types: Cigarettes   Quit date: 03/11/1975   Years since quitting: 48.7  Smokeless Tobacco Never   Counseling given: Not Answered   Outpatient Medications Prior to Visit  Medication Sig Dispense Refill   aspirin  EC 325 MG tablet Take 1 tablet (325 mg total) by mouth daily. 30 tablet 0   atenolol  (TENORMIN ) 50 MG tablet Take 1 tablet (50 mg total) by mouth daily. 90 tablet 3   atorvastatin  (LIPITOR) 40 MG tablet Take 1 tablet (40 mg total) by mouth daily. 90 tablet 3   CALCIUM -MAGNESIUM-ZINC PO Take 1 tablet by mouth in the morning.     Cholecalciferol (VITAMIN D3 PO) Take 2,000 Units by mouth in the morning.     famotidine  (PEPCID ) 40 MG tablet Take 1 tablet (40 mg total) by mouth every evening. 90 tablet 3   ferrous sulfate  (FEROSUL) 325 (65 FE) MG tablet Take 1 tablet (325 mg total) by mouth daily. 90 tablet 3   fluticasone (FLONASE) 50 MCG/ACT nasal spray Place 2 sprays into both nostrils in the morning.     lisinopril  (ZESTRIL ) 5 MG tablet Take 1 tablet (5 mg total) by mouth daily. 90 tablet 3   metFORMIN  (GLUCOPHAGE ) 500 MG tablet Take 1 tablet (500 mg total) by mouth 2 (two) times daily  with a meal. 180 tablet 3   mometasone  (ELOCON ) 0.1 % lotion For psoriasis of the scalp apply to aa's QHS and wash out in the morning. Use up to 5d/wk PRN. Avoid f/g/a. 60 mL 11   mupirocin  ointment (BACTROBAN ) 2 % Apply 1 Application topically daily. 22 g 0   omeprazole  (PRILOSEC) 40 MG capsule Take 1 capsule (40 mg total) by mouth every morning. 90 capsule 3   Roflumilast  (ZORYVE ) 0.3 % CREA Apply to aa's psoriasis QD PRN. 60 g 6   sildenafil  (REVATIO ) 20 MG tablet TAKE 5 TABLETS BY MOUTH AS NEEDED 150 tablet 5   tacrolimus  (PROTOPIC ) 0.1 % ointment Apply topically 2 (two) times daily. 100 g  0   venlafaxine  XR (EFFEXOR -XR) 37.5 MG 24 hr capsule Take 1 capsule (37.5 mg total) by mouth daily with breakfast. 90 capsule 3   Facility-Administered Medications Prior to Visit  Medication Dose Route Frequency Provider Last Rate Last Admin   triamcinolone  acetonide (KENALOG ) 10 MG/ML injection 5 mg  5 mg Intramuscular Once         Review of Systems  Review of Systems  Constitutional: Negative.   Respiratory: Negative.    Psychiatric/Behavioral: Negative.     Physical Exam  There were no vitals taken for this visit. Physical Exam Constitutional:      General: He is not in acute distress.    Appearance: Normal appearance. He is not ill-appearing.  HENT:     Head: Normocephalic and atraumatic.  Cardiovascular:     Rate and Rhythm: Normal rate and regular rhythm.  Pulmonary:     Effort: Pulmonary effort is normal.     Breath sounds: Normal breath sounds.  Skin:    General: Skin is warm and dry.  Neurological:     General: No focal deficit present.     Mental Status: He is alert and oriented to person, place, and time. Mental status is at baseline.  Psychiatric:        Mood and Affect: Mood normal.        Behavior: Behavior normal.        Thought Content: Thought content normal.        Judgment: Judgment normal.      Lab Results:  CBC    Component Value Date/Time   WBC 5.9  09/30/2023 0944   RBC 4.94 09/30/2023 0944   HGB 14.6 09/30/2023 0944   HCT 43.7 09/30/2023 0944   PLT 162.0 09/30/2023 0944   MCV 88.4 09/30/2023 0944   MCH 24.6 (L) 05/14/2021 0510   MCHC 33.3 09/30/2023 0944   RDW 14.2 09/30/2023 0944   LYMPHSABS 1.1 09/30/2023 0944   MONOABS 0.6 09/30/2023 0944   EOSABS 0.4 09/30/2023 0944   BASOSABS 0.0 09/30/2023 0944    BMET    Component Value Date/Time   NA 138 09/30/2023 0944   K 4.4 09/30/2023 0944   CL 102 09/30/2023 0944   CO2 31 09/30/2023 0944   GLUCOSE 117 (H) 09/30/2023 0944   BUN 16 09/30/2023 0944   CREATININE 1.00 09/30/2023 0944   CREATININE 1.04 02/27/2020 1257   CALCIUM  9.0 09/30/2023 0944   GFRNONAA >60 05/14/2021 0510   GFRAA 45 (L) 01/27/2018 1748    BNP No results found for: BNP  ProBNP No results found for: PROBNP  Imaging: DG Foot Complete Left Result Date: 11/27/2023 Please see detailed radiograph report in office note.    Assessment & Plan:   1. Severe sleep apnea (Primary)  Assessment and Plan Assessment & Plan Obstructive sleep apnea Obstructive sleep apnea is well controlled with CPAP therapy. He uses CPAP consistently, averaging 5 hours and 19 minutes per night. CPAP settings are on auto, with a minimum pressure of 5 and a maximum of 15, averaging at 7.9. The residual apnea score is 2 events per hour, significantly improved from 63 events per hour during the initial sleep study. Air leaks are noted but do not significantly affect control. Occasional nasal congestion, particularly when lying down, may affect CPAP usage/tolerance. - Increase CPAP humidification setting to 5 to address nasal dryness. - Discuss the option of a mask desensitization study if mask issues persist. - Advise on obtaining CPAP supplies from various  vendors, including [employer], [employer], or local stores like [employer], considering insurance coverage and cost. - Monitor CPAP machine for replacement every 5 years, with a  potential lifespan of up to 10 years.  Allergic rhinitis Allergic rhinitis contributes to nasal congestion, particularly when lying down, affecting CPAP usage. He experiences nasal dryness rather than rhinorrhea or sneezing. Uses saline nasal spray but may require additional treatment for better symptom control. - Consider using azelastine nasal spray for additional allergy symptom control. - Increase CPAP humidification to alleviate nasal dryness. - Consider referral to an ENT specialist if nasal congestion persists and affects CPAP tolerance.   Almarie LELON Ferrari, NP 11/30/2023

## 2023-12-14 ENCOUNTER — Ambulatory Visit: Payer: Self-pay | Admitting: Podiatry

## 2023-12-23 ENCOUNTER — Other Ambulatory Visit: Payer: Self-pay | Admitting: Family Medicine

## 2023-12-28 ENCOUNTER — Ambulatory Visit: Admitting: Podiatry

## 2023-12-28 DIAGNOSIS — S93692D Other sprain of left foot, subsequent encounter: Secondary | ICD-10-CM | POA: Diagnosis not present

## 2023-12-28 DIAGNOSIS — S93691D Other sprain of right foot, subsequent encounter: Secondary | ICD-10-CM

## 2023-12-28 NOTE — Progress Notes (Signed)
 He presents today for follow-up of plantar fasciitis to his left foot.  States that is just absolutely killing him states that is not any better as affecting my ability perform my daily activities.  I have tried injections medications massage therapy physical therapy corrective shoes nothing helps.  Objective: Vital signs stable alert oriented x 3.  Pulses are palpable.  He has severe pain on palpation medial calcaneal tubercle of the left heel.  He has tenderness and loss of integrity on palpation of the medial band of the plantar fascia of the left heel.  Assessment: Possible tear of the plantar fascia left heel.  Plan: Requesting MRI of the left heel at this time for differential diagnoses such as bursitis neuritis or tendon tear.  This is for surgical consideration and differential diagnoses.

## 2023-12-29 ENCOUNTER — Encounter: Payer: Self-pay | Admitting: Dermatology

## 2023-12-29 ENCOUNTER — Other Ambulatory Visit: Payer: Self-pay

## 2023-12-29 ENCOUNTER — Ambulatory Visit: Admitting: Dermatology

## 2023-12-29 ENCOUNTER — Telehealth: Payer: Self-pay | Admitting: Podiatry

## 2023-12-29 DIAGNOSIS — L409 Psoriasis, unspecified: Secondary | ICD-10-CM

## 2023-12-29 DIAGNOSIS — L738 Other specified follicular disorders: Secondary | ICD-10-CM | POA: Diagnosis not present

## 2023-12-29 DIAGNOSIS — L570 Actinic keratosis: Secondary | ICD-10-CM | POA: Diagnosis not present

## 2023-12-29 DIAGNOSIS — H61022 Chronic perichondritis of left external ear: Secondary | ICD-10-CM

## 2023-12-29 DIAGNOSIS — W908XXA Exposure to other nonionizing radiation, initial encounter: Secondary | ICD-10-CM | POA: Diagnosis not present

## 2023-12-29 DIAGNOSIS — L578 Other skin changes due to chronic exposure to nonionizing radiation: Secondary | ICD-10-CM

## 2023-12-29 DIAGNOSIS — Z79899 Other long term (current) drug therapy: Secondary | ICD-10-CM

## 2023-12-29 DIAGNOSIS — Z7189 Other specified counseling: Secondary | ICD-10-CM

## 2023-12-29 DIAGNOSIS — S93692D Other sprain of left foot, subsequent encounter: Secondary | ICD-10-CM

## 2023-12-29 DIAGNOSIS — H61002 Unspecified perichondritis of left external ear: Secondary | ICD-10-CM

## 2023-12-29 MED ORDER — MOMETASONE FUROATE 0.1 % EX SOLN: CUTANEOUS | 11 refills | Status: AC

## 2023-12-29 NOTE — Progress Notes (Signed)
 Follow-Up Visit   Subjective  [name] [name] is a 73 y.o. male who presents for the following: 2 months f/u on a rash on his left ear treating with Opzelura cream and Mometasone  solution 5 days a week with a good response, patient report rash is better but not completely gone away.  The following portions of the chart were reviewed this encounter and updated as appropriate: medications, allergies, medical history  Review of Systems:  No other skin or systemic complaints except as noted in HPI or Assessment and Plan.  Objective  Well appearing patient in no apparent distress; mood and affect are within normal limits.  Areas Examined: Face, scalp, left ear   Relevant exam findings are noted in the Assessment and Plan.  left ear middle helix x 1 Erythematous thin papules/macules with gritty scale.   Assessment & Plan   AK (ACTINIC KERATOSIS) left ear middle helix x 1 ACTINIC DAMAGE - chronic, secondary to cumulative UV radiation exposure/sun exposure over time - diffuse scaly erythematous macules with underlying dyspigmentation - Recommend daily broad spectrum sunscreen SPF 30+ to sun-exposed areas, reapply every 2 hours as needed.  - Recommend staying in the shade or wearing long sleeves, sun glasses (UVA+UVB protection) and wide brim hats (4-inch brim around the entire circumference of the hat). - Call for new or changing lesions.  Destruction of lesion - left ear middle helix x 1 Complexity: simple   Destruction method: cryotherapy   Informed consent: discussed and consent obtained   Timeout:  patient name, date of birth, surgical site, and procedure verified Lesion destroyed using liquid nitrogen: Yes   Region frozen until ice ball extended beyond lesion: Yes   Outcome: patient tolerated procedure well with no complications   Post-procedure details: wound care instructions given    PSORIASIS   Related Medications mometasone  (ELOCON ) 0.1 % lotion For psoriasis of the  scalp apply to aa's QHS and wash out in the morning. Use up to 5d/wk PRN. Avoid f/g/a.   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 and Psoriasis of Scalp. Improving  Exam: Well-demarcated erythematous papules/plaques with silvery scale, guttate pink scaly papules. 4% BSA. Chronic and persistent condition with duration or expected duration over one year. Condition is symptomatic/ bothersome to patient. Not currently at goal. patient denies joint pain Psoriasis is a chronic non-curable, but treatable genetic/hereditary disease that may have other systemic features affecting other organ systems such as joints (Psoriatic Arthritis). It is associated with an increased risk of inflammatory bowel disease, heart disease, non-alcoholic fatty liver disease, and depression.  Treatments include light and laser treatments; topical medications; and systemic medications including oral and injectables.   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  Trauma or pressure from sleeping on the ear or from cell phone use and sun damage may be exacerbating factors.  Treatment may include using a C-shaped airplane neck pillow for sleeping on the side of the head so no pressure is on the ear.  Other treatments include topical or intralesional steroids; liquid nitrogen or laser destruction; shave removal or excision.  The condition can be difficult to treat and persist or recur despite treatment.   Treatment Plan: Continue Mometasone  solution to aa's up to 5d/week.  Continue  sample Opzelura cream apply qd  (1 given) Lot [id number] Exp 11/07/2024  Long term medication management.  Patient is using long term (months to years) prescription medication  to control their dermatologic condition.  These medications require periodic monitoring to evaluate for efficacy  and side effects and may require periodic laboratory monitoring.   Sebaceous Hyperplasia face - Small yellow papules with a central dell - Benign-appearing - Observe. Call for changes.    Return in about 4 months (around 04/30/2024) for recheck AK-left ear .  IFay Kirks, CMA, am acting as scribe for Alm Rhyme, MD .   Documentation: I have reviewed the above documentation for accuracy and completeness, and I agree with the above.  Alm Rhyme, MD

## 2023-12-29 NOTE — Telephone Encounter (Signed)
 The patient states his MRI should be for the left foot. However the MRI team has him set up for the right foot.

## 2023-12-29 NOTE — Patient Instructions (Signed)

## 2023-12-30 ENCOUNTER — Encounter: Payer: Self-pay | Admitting: Podiatry

## 2024-01-01 ENCOUNTER — Ambulatory Visit
Admission: RE | Admit: 2024-01-01 | Discharge: 2024-01-01 | Disposition: A | Discharge status: Home / Self Care | Source: Ambulatory Visit | Attending: Podiatry | Admitting: Podiatry

## 2024-01-01 DIAGNOSIS — S93692D Other sprain of left foot, subsequent encounter: Secondary | ICD-10-CM

## 2024-01-05 ENCOUNTER — Telehealth: Payer: Self-pay | Admitting: Podiatry

## 2024-01-05 NOTE — Telephone Encounter (Signed)
 Pt .would like to get his MRI results pt. received a message that MRI results are in via my chart Pt is interested in dong a telephone visit due to the fact pt. Is out of town this week [phone]

## 2024-01-06 ENCOUNTER — Telehealth: Payer: Self-pay | Admitting: Podiatry

## 2024-01-06 ENCOUNTER — Ambulatory Visit: Payer: Self-pay | Admitting: Podiatry

## 2024-01-06 NOTE — Telephone Encounter (Signed)
 Patient MRI results came in and he would like to know if he can come in to go over results before available time showing on Dr. Pansy schedule? Next available appointment for Est pt is on the first of December

## 2024-01-06 NOTE — Progress Notes (Signed)
No answer left detailed message.

## 2024-01-11 ENCOUNTER — Ambulatory Visit: Admitting: Podiatry

## 2024-01-11 DIAGNOSIS — S93692D Other sprain of left foot, subsequent encounter: Secondary | ICD-10-CM | POA: Diagnosis not present

## 2024-01-11 NOTE — Progress Notes (Signed)
 Winson presents today for a follow-up of his MRI with his wife.  He is here to discuss surgical options for his right foot.  He states that is really not doing that much better.  Objective: Vital signs are stable alert oriented x 3.  MRI does demonstrate a tear of the medial band of the plantar fascia.  He has pain on palpation medial band of the plantar fascia.  Assessment: Rupture of the medial band of the plantar fascia.  Plan: Discussed etiology pathology conservative surgical therapies at this point we consented him for an endoscopic plantar fasciotomy of the right foot.  Also a PRP injection of that right foot.  Dispensed a cam boot.  We did discuss the possible side effects and complications associated with this which may include but are not limited to postop pain bleeding swelling faction recurrence need for further surgery overcorrection under correction loss of digit loss limb loss of life.  We will take care of this in the near future at [facility] where he was provided information regarding the center as well as the anesthesia group.

## 2024-01-12 ENCOUNTER — Telehealth: Payer: Self-pay | Admitting: Lab

## 2024-01-12 NOTE — Telephone Encounter (Signed)
 Patient was calling wanting to know if his heel spur could be addressed when he has his surgery? Patient is aware the provider is out the office and wanted me to send message so it can be addressed.

## 2024-01-14 ENCOUNTER — Telehealth: Payer: Self-pay | Admitting: Podiatry

## 2024-01-14 NOTE — Telephone Encounter (Signed)
 DOS- 01/22/2024 (WILL BE DONE AT [facility])  ENDOSCOPIC PLANTAR FASCIOTOMY LT- 29893  [employer] EFFECTIVE DATE- 03/11/2023  DEDUCTIBLE- $0 REMAINING- $0 OOP- $3500 REMAINING- $3049.12 COINSURANCE- $3500 REMAINING- $3049.12  PER [employer] PORTAL, NO PRIOR AUTH IS REQUIRED FOR CPT CODE 70106. DOCUMENTATION ATTACHED TO SURGERY CONSENT PACKET.

## 2024-01-14 NOTE — Telephone Encounter (Signed)
 Called and scheduled patient for surgery for 01/22/2024. Patient is on aspirin  and is aware to stop use 7 days prior to surgery. Patient not on any GLP1 medications.Patient pharmacy is correct in chart. Patient aware [facility] will call 24-48 hours prior to surgery with arrival time.

## 2024-01-20 ENCOUNTER — Other Ambulatory Visit: Payer: Self-pay | Admitting: Podiatry

## 2024-01-20 MED ORDER — CEPHALEXIN 500 MG PO CAPS
500.0000 mg | ORAL_CAPSULE | Freq: Three times a day (TID) | ORAL | 0 refills | Status: AC
Start: 2024-01-20 — End: ?

## 2024-01-20 MED ORDER — HYDROCODONE-ACETAMINOPHEN 5-325 MG PO TABS: 2.0000 | ORAL_TABLET | Freq: Four times a day (QID) | ORAL | 0 refills | Status: AC | PRN

## 2024-01-20 MED ORDER — ONDANSETRON HCL 4 MG PO TABS: 4.0000 mg | ORAL_TABLET | Freq: Three times a day (TID) | ORAL | 0 refills | Status: AC | PRN

## 2024-01-22 DIAGNOSIS — G8918 Other acute postprocedural pain: Secondary | ICD-10-CM | POA: Diagnosis not present

## 2024-01-22 DIAGNOSIS — M722 Plantar fascial fibromatosis: Secondary | ICD-10-CM | POA: Diagnosis not present

## 2024-01-22 DIAGNOSIS — S93692D Other sprain of left foot, subsequent encounter: Secondary | ICD-10-CM | POA: Diagnosis not present

## 2024-01-27 ENCOUNTER — Ambulatory Visit (INDEPENDENT_AMBULATORY_CARE_PROVIDER_SITE_OTHER): Admitting: Podiatry

## 2024-01-27 DIAGNOSIS — S93692D Other sprain of left foot, subsequent encounter: Secondary | ICD-10-CM

## 2024-01-27 NOTE — Progress Notes (Signed)
 [name] [name] presents with his wife today date of surgery 01/22/2024 endoscopic plantar fasciotomy of the left foot.  States that he seems to be doing pretty well.  His wife states that he has been outside blowing leaves in the yard.  Objective: Vital signs are stable alert oriented x 3 dry sterile dressing is intact.  There are leaves in the boot.  Sutures are intact margins are well coapted.  He has no tenderness on palpation of the surgical site or the plantar fascia transection.  Assessment: Well-healing surgical foot.  Plan: Encouraged him to place Band-Aids daily to keep the socks from rubbing.  I am going to allow him to take a shower on this but otherwise he is to stay in his boot 24/7.  I will follow-up with him in about 2 weeks to remove the sutures.

## 2024-02-08 ENCOUNTER — Ambulatory Visit: Admitting: Podiatry

## 2024-02-10 ENCOUNTER — Ambulatory Visit (INDEPENDENT_AMBULATORY_CARE_PROVIDER_SITE_OTHER): Admitting: Podiatry

## 2024-02-10 DIAGNOSIS — S93692D Other sprain of left foot, subsequent encounter: Secondary | ICD-10-CM

## 2024-02-10 NOTE — Progress Notes (Signed)
 He presents today status post endoscopic plantar fasciotomy date of surgery 01/22/2024 states that his foot has felt perfect.  Objective: Stitches are retained to the medial and lateral part of the left foot.  Stitches were removed today margins remain well coapted no reproducible pain.  Assessment: Well-healing surgical foot.  Plan: I will allow him to wear tennis shoes during the day and even at home in the house.  And he will continue to sleep with a cam boot for another 3 weeks.  I will follow-up with him at that time.

## 2024-02-26 ENCOUNTER — Other Ambulatory Visit: Payer: Self-pay | Admitting: Family Medicine

## 2024-02-26 ENCOUNTER — Telehealth: Payer: Self-pay

## 2024-02-26 MED ORDER — OSELTAMIVIR PHOSPHATE 75 MG PO CAPS: 75.0000 mg | ORAL_CAPSULE | Freq: Two times a day (BID) | ORAL | 0 refills | Status: AC

## 2024-02-26 MED ORDER — AZITHROMYCIN 250 MG PO TABS: ORAL_TABLET | ORAL | 0 refills | Status: AC

## 2024-02-26 NOTE — Telephone Encounter (Signed)
 E2c2 agent will let pt know waiting on md to reply

## 2024-02-26 NOTE — Telephone Encounter (Signed)
 Copied from CRM #[id number]. Topic: Clinical - Medication Question >> Feb 26, 2024  9:29 AM Mercedes MATSU wrote: Reason for CRM: Patient called in stating that his wife has flu type A, he thinks he has it too. He denies temp, but he said he has minor aches. He does not want to through the weekend without Tamiflu or therma flu. Patient is requesting the Tamiflu be sent to the [facility] in [place]. Patient can also be reached at [phone]. Patient said he was having sinus issues as well, so he may need a z pack due to his head being full and coughing a lot.

## 2024-02-26 NOTE — Telephone Encounter (Signed)
 Pt notified, voiced understanding.

## 2024-02-26 NOTE — Telephone Encounter (Signed)
 I sent in the Zpack and Tamiflu

## 2024-02-26 NOTE — Progress Notes (Signed)
 Done

## 2024-02-29 ENCOUNTER — Ambulatory Visit: Admitting: Podiatry

## 2024-02-29 VITALS — Ht 72.0 in | Wt 225.5 lb

## 2024-02-29 DIAGNOSIS — S93692D Other sprain of left foot, subsequent encounter: Secondary | ICD-10-CM

## 2024-03-01 NOTE — Progress Notes (Signed)
"  °  Subjective:  Patient ID: [name] [name], male    DOB: [birth year],  MRN: [id number]  Chief Complaint  Patient presents with   Post-op Follow-up    RM 9 POV #3 DOS 01/22/2024 LT ENDOSCOPIC PLANTAR FASCIOTOMY (HYATT). Pt states no pain or discomfort in left foot. Pt is ready to return to regular activities and discontinue use of the walking boot.     73 y.o. male returns for post-op check.  Overall he is doing well  Review of Systems: Negative except as noted in the HPI. Denies N/V/F/Ch.   Objective:  There were no vitals filed for this visit. Body mass index is 30.58 kg/m. Constitutional Well developed. Well nourished.  Vascular Foot warm and well perfused. Capillary refill normal to all digits.  Calf is soft and supple, no posterior calf or knee pain, negative Homans' sign  Neurologic Normal speech. Oriented to person, place, and time. Epicritic sensation to light touch grossly present bilaterally.  Dermatologic Incision is well-healed not hypertrophic  Orthopedic: He has no pain to palpation noted about the surgical site.    Assessment:   1. Rupture of plantar fascia of left foot, subsequent encounter    Plan:  Patient was evaluated and treated and all questions answered.  S/p foot surgery left Doing very well may gradually increase activity and exercise as tolerated.  Avoid impact activity until 34-month mark and then can do anything he would like to.  Regular shoe gear.  Follow-up as needed  No follow-ups on file.  "

## 2024-03-02 ENCOUNTER — Encounter: Admitting: Podiatry

## 2024-03-08 DIAGNOSIS — K080 Exfoliation of teeth due to systemic causes: Secondary | ICD-10-CM | POA: Diagnosis not present

## 2024-03-19 ENCOUNTER — Other Ambulatory Visit: Payer: Self-pay | Admitting: Family Medicine

## 2024-03-29 NOTE — Progress Notes (Signed)
 AMORE GRATER                                          MRN: [id number]   03/29/2024   The [facility] Quality Team Specialist reviewed this patient medical record for the purposes of chart review for care gap closure. The following were reviewed: chart review for care gap closure-controlling blood pressure.    [facility] Quality Team

## 2024-04-13 ENCOUNTER — Telehealth: Payer: Self-pay

## 2024-04-13 ENCOUNTER — Other Ambulatory Visit (HOSPITAL_COMMUNITY): Payer: Self-pay

## 2024-04-13 NOTE — Telephone Encounter (Signed)
 Pharmacy Patient Advocate Encounter   Received notification from [facility] KEY that prior authorization for Sildenafil  citrate 20 is required/requested.   Insurance verification completed.   The patient is insured through [employer].   Per test claim: PA required; PA submitted to above mentioned insurance via Latent Key/confirmation #/EOC [id number] Status is pending

## 2024-04-25 ENCOUNTER — Ambulatory Visit: Admitting: Dermatology

## 2024-12-06 ENCOUNTER — Ambulatory Visit: Admitting: Urology
